# Patient Record
Sex: Female | Born: 1995 | Race: White | Hispanic: No | Marital: Married
Health system: Midwestern US, Academic
[De-identification: ages and names within clinical notes are randomized; demographics above are authoritative.]

## PROBLEM LIST (undated history)

## (undated) DIAGNOSIS — N912 Amenorrhea, unspecified: Secondary | ICD-10-CM

## (undated) LAB — HEPATIC FUNCTION PANEL
A/G Ratio: 15
ALT: 13 U/L
ALT: 13 U/L
ALT: 15 U/L
ALT: 19 U/L
ALT: 29 U/L
ALT: 29 U/L
ALT: 32 U/L
ALT: 33 U/L
ALT: 33 U/L
ALT: 33 U/L
ALT: 33 U/L
ALT: 36 U/L
ALT: 45 U/L
ALT: 45 U/L
ALT: 46 U/L
ALT: 47 U/L
ALT: 92 U/L
AST (SGOT): 21.5
AST: 109 U/L
AST: 129 U/L
AST: 21 U/L
AST: 28 U/L
AST: 29 U/L
AST: 43 U/L
AST: 45 U/L
AST: 47 U/L
AST: 48 U/L
AST: 48 U/L
AST: 48 U/L
AST: 49 U/L
AST: 50 U/L
AST: 54 U/L
AST: 55 U/L
AST: 58 U/L
Albumin: 3.6
Albumin: 3.9 g/dL (ref 3.5–5.0)
Alkaline Phosphatase: 103 U/L
Alkaline Phosphatase: 107 U/L
Alkaline Phosphatase: 108 U/L
Alkaline Phosphatase: 109 U/L
Alkaline Phosphatase: 112 U/L
Alkaline Phosphatase: 115 U/L
Alkaline Phosphatase: 125 U/L
Alkaline Phosphatase: 137 U/L
Alkaline Phosphatase: 137 U/L
Alkaline Phosphatase: 142 U/L
Alkaline Phosphatase: 201 U/L
Alkaline Phosphatase: 201 U/L
Alkaline Phosphatase: 43 U/L
Alkaline Phosphatase: 45 U/L
Alkaline Phosphatase: 71 U/L
Alkaline Phosphatase: 87 U/L
Alkaline Phosphatase: 96 U/L
Bilirubin, Direct (Micro): 0.2
Bilirubin, Direct: 0.3 mg/dL (ref 0.01–0.4)
Bilirubin, Direct: 0.34 mg/dL (ref 0.01–0.4)
Bilirubin, Direct: 0.5 mg/dL (ref 0.01–0.4)
Bilirubin, Direct: 0.5 mg/dL (ref 0.01–0.4)
Bilirubin, Direct: 0.5 mg/dL (ref 0.01–0.4)
Bilirubin, Direct: 0.5 mg/dL (ref 0.01–0.4)
Bilirubin, Direct: 0.5 mg/dL (ref 0.01–0.4)
Bilirubin, Direct: 0.57 mg/dL (ref 0.01–0.4)
Bilirubin, Direct: 0.6 mg/dL
Bilirubin, Direct: 0.6 mg/dL (ref 0.01–0.4)
Bilirubin, Direct: 0.7 mg/dL (ref 0.01–0.4)
Bilirubin, Direct: 0.7 mg/dL (ref 0.01–0.4)
Bilirubin, Direct: 0.74 mg/dL (ref 0.01–0.4)
Protein, Total: 7.3
Total Bilirubin: 1.1 mg/dL (ref 0.1–1.4)
Total Bilirubin: 1.26 mg/dL (ref 0.1–1.4)
Total Bilirubin: 1.3 mg/dL (ref 0.1–1.4)
Total Bilirubin: 1.4 mg/dL (ref 0.1–1.4)
Total Bilirubin: 1.59 mg/dL (ref 0.1–1.4)
Total Bilirubin: 1.6 mg/dL (ref <=1.4)
Total Bilirubin: 1.78 mg/dL (ref 0.1–1.4)
Total Bilirubin: 1.79 mg/dL (ref 0.1–1.4)
Total Bilirubin: 1.85 mg/dL (ref 0.1–1.4)
Total Bilirubin: 2.29 mg/dL (ref 0.1–1.4)
Total Bilirubin: 2.3 mg/dL (ref 0.1–1.4)
Total Bilirubin: 2.38 mg/dL (ref 0.1–1.4)
Total Bilirubin: 2.8 mg/dL (ref 0.1–1.4)
Total Bilirubin: 2.87 mg/dL (ref 0.1–1.4)
Total Bilirubin: 2.87 mg/dL (ref 0.1–1.4)
Total Bilirubin: 2.97 mg/dL (ref 0.1–1.4)
Total Protein: 6.4 g/dL (ref 6.4–8.2)
Total Protein: 6.4 g/dL (ref 6.4–8.2)
Total Protein: 6.5 g/dL (ref 6.4–8.2)
Total Protein: 6.5 g/dL (ref 6.4–8.2)
Total Protein: 6.5 g/dL (ref 6.4–8.2)
Total Protein: 6.6 g/dL (ref 6.4–8.2)
Total Protein: 6.8 g/dL (ref 6.4–8.2)
Total Protein: 6.9 g/dL (ref 6.4–8.2)
Total Protein: 6.9 g/dL (ref 6.4–8.2)
Total Protein: 7.3 g/dL (ref 6.4–8.2)
Total Protein: 7.3 g/dL (ref 6.4–8.2)
Total Protein: 7.5 g/dL (ref 6.4–8.2)
Total Protein: 7.7 g/dL (ref 6.4–8.2)
Total Protein: 7.7 g/dL (ref 6.4–8.2)

## (undated) LAB — RENAL FUNCTION PANEL W/O EGFR
Albumin: 2.9
Albumin: 3.1
Albumin: 3.2
Albumin: 3.2
Albumin: 3.2
Albumin: 3.2
Albumin: 3.3
Albumin: 3.3
Albumin: 3.4
Albumin: 3.4
Albumin: 3.5
Albumin: 3.7
Albumin: 3.7
Albumin: 3.7
BUN: 11 mg/dL (ref 4–21)
BUN: 12 mg/dL (ref 4–21)
BUN: 13 mg/dL (ref 4–21)
BUN: 13 mg/dL (ref 4–21)
BUN: 13 mg/dL (ref 4–21)
BUN: 13 mg/dL (ref 4–21)
BUN: 14 mg/dL (ref 4–21)
BUN: 15 mg/dL (ref 4–21)
BUN: 15 mg/dL (ref 4–21)
BUN: 15 mg/dL (ref 4–21)
BUN: 15 mg/dL (ref 4–21)
BUN: 16 mg/dL (ref 4–21)
BUN: 17 mg/dL (ref 4–21)
BUN: 18 mg/dL (ref 4–21)
BUN: 18 mg/dL (ref 4–21)
BUN: 19 mg/dL (ref 4–21)
CO2: 25 mmol/L (ref 13–22)
CO2: 25 mmol/L (ref 13–22)
CO2: 26 mmol/L (ref 13–22)
CO2: 26 mmol/L (ref 13–22)
CO2: 26 mmol/L (ref 13–22)
CO2: 28 mmol/L (ref 13–22)
CO2: 29 mmol/L (ref 13–22)
CO2: 29 mmol/L (ref 13–22)
CO2: 29 mmol/L (ref 13–22)
CO2: 29 mmol/L (ref 13–22)
CO2: 30 mmol/L (ref 13–22)
CO2: 30 mmol/L (ref 13–22)
CO2: 31 mmol/L (ref 13–22)
CO2: 31 mmol/L (ref 13–22)
Calcium: 8.6 mg/dL (ref 8.7–10.7)
Calcium: 8.6 mg/dL (ref 8.7–10.7)
Calcium: 8.7 mg/dL (ref 8.7–10.7)
Calcium: 8.8 mg/dL (ref 8.7–10.7)
Calcium: 8.8 mg/dL (ref 8.7–10.7)
Calcium: 8.9 mg/dL (ref 8.7–10.7)
Calcium: 8.9 mg/dL (ref 8.7–10.7)
Calcium: 8.9 mg/dL (ref 8.7–10.7)
Calcium: 8.9 mg/dL (ref 8.7–10.7)
Calcium: 9
Calcium: 9 mg/dL (ref 8.7–10.7)
Calcium: 9 mg/dL (ref 8.7–10.7)
Calcium: 9 mg/dL (ref 8.7–10.7)
Calcium: 9 mg/dL (ref 8.7–10.7)
Calcium: 9.1 mg/dL (ref 8.7–10.7)
Calcium: 9.1 mg/dL (ref 8.7–10.7)
Carbon Dioxide (CO2): 27
Chloride: 100 mmol/L (ref 99–108)
Chloride: 101 mmol/L (ref 99–108)
Chloride: 101 mmol/L (ref 99–108)
Chloride: 101 mmol/L (ref 99–108)
Chloride: 101 mmol/L (ref 99–108)
Chloride: 102 mmol/L (ref 99–108)
Chloride: 102 mmol/L (ref 99–108)
Chloride: 102 mmol/L (ref 99–108)
Chloride: 102 mmol/L (ref 99–108)
Chloride: 102 mmol/L (ref 99–108)
Chloride: 104 mmol/L (ref 99–108)
Chloride: 104 mmol/L (ref 99–108)
Chloride: 105 mmol/L (ref 99–108)
Chloride: 105 mmol/L (ref 99–108)
Chloride: 105 mmol/L (ref 99–108)
Chloride: 107 mmol/L (ref 99–108)
Creatinine: 0.6
Creatinine: 0.64 mg/dL (ref 0.5–1.1)
Creatinine: 0.69 mg/dL (ref 0.5–1.1)
Creatinine: 0.7
Creatinine: 0.7 mg/dL (ref 0.5–1.1)
Creatinine: 0.7 mg/dL (ref 0.5–1.1)
Creatinine: 0.7 mg/dL (ref 0.5–1.1)
Creatinine: 0.7 mg/dL (ref 0.5–1.1)
Creatinine: 0.7 mg/dL (ref 0.5–1.1)
Creatinine: 0.7 mg/dL (ref 0.5–1.1)
Creatinine: 0.74 mg/dL (ref 0.5–1.1)
Creatinine: 0.78 mg/dL (ref 0.5–1.1)
Creatinine: 0.8 mg/dL (ref 0.5–1.1)
Creatinine: 0.8 mg/dL (ref 0.5–1.1)
Creatinine: 0.8 mg/dL (ref 0.5–1.1)
Creatinine: 0.81 mg/dL (ref 0.5–1.1)
EGFR: 104.6 mg/dL
EGFR: 104.6 mg/dL
EGFR: 104.6 mg/dL
EGFR: 104.6 mg/dL
EGFR: 105 mg/dL
EGFR: 105.6 mg/dL
EGFR: 105.6 mg/dL
EGFR: 60 mg/dL
EGFR: 88 mg/dL
EGFR: 89.7 mg/dL
EGFR: 90.5 mg/dL
EGFR: 90.5 mg/dL
EGFR: 92.4 mg/dL
EGFR: 97 mg/dL
Glucose: 78 mg/dL
Glucose: 78 mg/dL
Glucose: 79
Glucose: 79 mg/dL
Glucose: 81 mg/dL
Glucose: 81 mg/dL
Glucose: 81.5 mg/dL
Glucose: 83 mg/dL
Glucose: 84 mg/dL
Glucose: 85.6 mg/dL
Glucose: 86 mg/dL
Glucose: 87.7 mg/dL
Glucose: 94 mg/dL
Glucose: 98.4 mg/dL
Phosphorus: 2.4 mg/dL (ref 2.5–4.9)
Phosphorus: 2.5 mg/dL (ref 2.5–4.9)
Phosphorus: 2.7 mg/dL (ref 2.5–4.9)
Phosphorus: 2.7 mg/dL (ref 2.5–4.9)
Phosphorus: 2.8 mg/dL (ref 2.5–4.9)
Phosphorus: 2.8 mg/dL (ref 2.5–4.9)
Phosphorus: 3 mg/dL (ref 2.5–4.9)
Phosphorus: 3 mg/dL (ref 2.5–4.9)
Phosphorus: 3.1 mg/dL (ref 2.5–4.9)
Phosphorus: 3.3 mg/dL (ref 2.5–4.9)
Phosphorus: 3.4 mg/dL (ref 2.5–4.9)
Phosphorus: 4 mg/dL (ref 2.5–4.9)
Potassium: 3.1 mmol/L (ref 3.4–5.3)
Potassium: 3.4 mmol/L (ref 3.4–5.3)
Potassium: 3.42 mmol/L (ref 3.4–5.3)
Potassium: 3.5 mmol/L (ref 3.4–5.3)
Potassium: 3.53 mmol/L (ref 3.4–5.3)
Potassium: 3.57 mmol/L (ref 3.4–5.3)
Potassium: 3.6 mmol/L (ref 3.4–5.3)
Potassium: 3.63 mmol/L (ref 3.4–5.3)
Potassium: 3.63 mmol/L (ref 3.4–5.3)
Potassium: 3.74 mmol/L (ref 3.4–5.3)
Potassium: 3.8 mmol/L (ref 3.4–5.3)
Potassium: 3.8 mmol/L (ref 3.4–5.3)
Potassium: 3.9 mmol/L (ref 3.4–5.3)
Potassium: 3.99 mmol/L (ref 3.4–5.3)
Potassium: 4.21 mmol/L (ref 3.4–5.3)
Potassium: 4.23 mmol/L (ref 3.4–5.3)
Sodium: 137 mmol/L (ref 137–147)
Sodium: 137 mmol/L (ref 137–147)
Sodium: 138 mmol/L (ref 137–147)
Sodium: 138 mmol/L (ref 137–147)
Sodium: 139 mmol/L (ref 137–147)
Sodium: 139 mmol/L (ref 137–147)
Sodium: 139 mmol/L (ref 137–147)
Sodium: 139 mmol/L (ref 137–147)
Sodium: 139 mmol/L (ref 137–147)
Sodium: 139 mmol/L (ref 137–147)
Sodium: 139 mmol/L (ref 137–147)
Sodium: 139 mmol/L (ref 137–147)
Sodium: 139 mmol/L (ref 137–147)
Sodium: 140 mmol/L (ref 137–147)
Sodium: 140 mmol/L (ref 137–147)
Sodium: 140 mmol/L (ref 137–147)

## (undated) LAB — CBC AND DIFFERENTIAL
Basophils Absolute: 0.1 /??L
Basophils Relative: 0 % (ref 0–3)
Basophils Relative: 1 % (ref 0–3)
Basophils Relative: 1 % (ref 0–3)
Eosinophils Absolute: 0.1 /??L
Eosinophils Relative: 5 % (ref 0–6)
Eosinophils Relative: 5 % (ref 0–6)
Eosinophils Relative: 5 % (ref 0–6)
Eosinophils Relative: 7 % (ref 0–6)
Eosinophils Relative: 7 % (ref 0–6)
Hematocrit: 32.3 % (ref 36–46)
Hematocrit: 32.6 % (ref 36–46)
Hematocrit: 32.7 % (ref 36–46)
Hematocrit: 33.5 % (ref 36–46)
Hematocrit: 33.5 % (ref 36–46)
Hematocrit: 33.8 % (ref 36–46)
Hematocrit: 35.8 % (ref 36–46)
Hematocrit: 37.1 % (ref 36–46)
Hematocrit: 38 % (ref 36–46)
Hematocrit: 38.1 % (ref 36–46)
Hemoglobin: 10.9 g/dL (ref 12.0–16.0)
Hemoglobin: 11 g/dL (ref 12.0–16.0)
Hemoglobin: 11.1 g/dL (ref 12.0–16.0)
Hemoglobin: 11.3 g/dL (ref 12.0–16.0)
Hemoglobin: 11.4 g/dL (ref 12.0–16.0)
Hemoglobin: 11.4 g/dL (ref 12.0–16.0)
Hemoglobin: 12.3 g/dL (ref 12.0–16.0)
Hemoglobin: 12.5 g/dL (ref 12.0–16.0)
Hemoglobin: 12.6 g/dL (ref 12.0–16.0)
Hemoglobin: 13 g/dL (ref 12.0–16.0)
Lymphocytes Absolute: 0.9 /??L
Lymphocytes Relative: 31 % (ref 18–52)
Lymphocytes Relative: 32 % (ref 18–52)
Lymphocytes Relative: 32 % (ref 18–52)
Lymphocytes Relative: 32 % (ref 18–52)
Lymphocytes Relative: 34 % (ref 18–52)
Lymphocytes Relative: 42 % (ref 18–52)
MCH: 31 pg (ref 26.0–34.0)
MCH: 31 pg (ref 26.0–34.0)
MCH: 31.9 pg (ref 26.0–34.0)
MCH: 32.1 pg (ref 26.0–34.0)
MCH: 33.9 pg (ref 26.0–34.0)
MCH: 36.4 pg (ref 26.0–34.0)
MCH: 36.6 pg (ref 26.0–34.0)
MCH: 36.8 pg (ref 26.0–34.0)
MCH: 37.7 pg (ref 26.0–34.0)
MCH: 38.7 pg (ref 26.0–34.0)
MCHC: 32.6 g/dL (ref 30–37)
MCHC: 32.7 g/dL (ref 30–37)
MCHC: 33 g/dL (ref 30–37)
MCHC: 33.7 g/dL (ref 30–37)
MCHC: 33.8 g/dL (ref 30–37)
MCHC: 34 g/dL (ref 30–37)
MCHC: 34.3 g/dL (ref 30–37)
MCHC: 34.4 g/dL (ref 30–37)
MCHC: 34.5 g/dL (ref 30–37)
MCHC: 34.9 g/dL (ref 30–37)
MCV: 106.6 fL (ref 82.0–108.0)
MCV: 108 fL (ref 82.0–108.0)
MCV: 108.1 fL (ref 82.0–108.0)
MCV: 109.2 fL (ref 82.0–108.0)
MCV: 110.8 fL (ref 82.0–108.0)
MCV: 92 fL (ref 82.0–108.0)
MCV: 94.7 fL (ref 82.0–108.0)
MCV: 95 fL (ref 82.0–108.0)
MCV: 97.9 fL (ref 82.0–108.0)
MCV: 98.4 fL (ref 82.0–108.0)
Monocytes Absolute: 0.2 /??L
Monocytes Relative: 10 % (ref 3–10)
Monocytes Relative: 10 % (ref 3–10)
Monocytes Relative: 5 % (ref 3–10)
Monocytes Relative: 6 % (ref 3–10)
Monocytes Relative: 7 % (ref 3–10)
Monocytes Relative: 8 % (ref 3–10)
Neutrophils Absolute: 1.5 /??L
Neutrophils Relative: 46 % (ref 46–78)
Neutrophils Relative: 51 % (ref 46–78)
Neutrophils Relative: 56 % (ref 46–78)
Neutrophils Relative: 56 % (ref 46–78)
Neutrophils Relative: 57 % (ref 46–78)
Platelets: 102 K/??L
Platelets: 104 K/??L
Platelets: 109 K/??L
Platelets: 112 K/??L
Platelets: 119 K/??L
Platelets: 71 K/??L
Platelets: 79 K/??L
Platelets: 84 K/??L
Platelets: 89 K/??L
Platelets: 91 K/??L
RBC: 2.99 10^6/??L (ref 4.00–5.20)
RBC: 3.02 10^6/??L (ref 4.00–5.20)
RBC: 3.03 10^6/??L (ref 4.00–5.20)
RBC: 3.1 10^6/??L (ref 4.00–5.20)
RBC: 3.35 10^6/??L (ref 4.00–5.20)
RBC: 3.42 10^6/??L (ref 4.00–5.20)
RBC: 3.56 10^6/??L (ref 4.00–5.20)
RBC: 3.63 10^6/??L (ref 4.00–5.20)
RBC: 4.03 10^6/??L (ref 4.00–5.20)
RBC: 4.12 10^6/??L (ref 4.00–5.20)
RDW: 14.4 % (ref 11.5–14.5)
RDW: 15.7 % (ref 11.5–14.5)
RDW: 16.1 % (ref 11.5–14.5)
RDW: 16.9 % (ref 11.5–14.5)
RDW: 17.3 % (ref 11.5–14.5)
RDW: 17.9 % (ref 11.5–14.5)
RDW: 18.4 % (ref 11.5–14.5)
RDW: 19.8 % (ref 11.5–14.5)
RDW: 20.6 % (ref 11.5–14.5)
RDW: 22.6 % (ref 11.5–14.5)
WBC: 1.1 10^3/mL
WBC: 1.3 10^3/mL
WBC: 1.4 10^3/mL
WBC: 1.8 10^3/mL
WBC: 1.8 10^3/mL
WBC: 1.8 10^3/mL
WBC: 1.9 10^3/mL
WBC: 2.3 10^3/mL
WBC: 2.5 10^3/mL
WBC: 2.7 10^3/mL

## (undated) LAB — PROTIME-INR
INR: 1.01 (ref 0.9–1.1)
INR: 1.17 (ref 0.9–1.1)
INR: 1.2 (ref 0.9–1.1)
INR: 1.22 (ref 0.9–1.1)
INR: 1.22 (ref 0.9–1.1)
INR: 1.24 (ref 0.9–1.1)
INR: 1.24 (ref 0.9–1.1)
INR: 1.24 (ref 0.9–1.1)
INR: 1.27 (ref 0.9–1.1)
INR: 1.28 (ref 0.9–1.1)
INR: 1.29 (ref 0.9–1.1)
INR: 1.3 (ref 0.9–1.1)
INR: 1.33 (ref 0.9–1.1)
INR: 1.33 (ref 0.9–1.1)
INR: 1.4 (ref 0.9–1.1)
Protime: 11.9 seconds (ref 10.0–13.8)
Protime: 13.3
Protime: 13.6
Protime: 13.8
Protime: 13.8
Protime: 14
Protime: 14
Protime: 14
Protime: 14.4
Protime: 14.5
Protime: 14.6
Protime: 14.7
Protime: 15
Protime: 15.1
Protime: 15.9

## (undated) LAB — LUTEINIZING HORMONE
LH: 11.1
LH: 21.9

## (undated) LAB — HEPATITIS B SURFACE ANTIGEN: Hepatitis B Surface Ag Confirm: NEGATIVE

## (undated) LAB — CBC
Hematocrit: 35.2 % (ref 36–46)
Hemoglobin: 12.2 g/dL (ref 12.0–16.0)
MCH: 36.1 pg (ref 26.0–34.0)
MCV: 103.9 fL (ref 82.0–108.0)
Platelets: 107
Red Blood Cells:: 3.39
WBC: 2.2 10^3/mL

## (undated) LAB — FOLLICLE STIMULATING HORMONE
FSH: 4.7
FSH: 6.8

## (undated) LAB — GTT 2HR (2HR)

## (undated) LAB — TESTOSTERONE, TOTAL, LC/MS
Testosterone, Reanalysis: 289
Testosterone, Reanalysis: 409

## (undated) LAB — ABO/RH: Rh Type: POSITIVE

## (undated) LAB — RPR: RPR: NEGATIVE

## (undated) LAB — ANTIBODY SCREEN: Antibody Screen: NEGATIVE

## (undated) LAB — LIPID PANEL
Cholesterol, Total: 186 mg/dL
HDL: 51
LDL Cholesterol: 124.2 mg/dL
Triglycerides: 54 mg/dL (ref 40–160)

## (undated) LAB — ANTIMULLERIAN HORMONE (AMH): Anti-Mullerian Hormone (AMH): 7.32

## (undated) LAB — COMPREHENSIVE METABOLIC PANEL: CO2: 30 mmol/L (ref 13–22)

## (undated) LAB — TUMOR MARKER PANEL: AFP-Tumor Marker: 4.2

## (undated) LAB — HEMOGLOBIN A1C: Hemoglobin A1C: 4.8 % (ref 4.0–6.0)

## (undated) LAB — GLUCOSE, RANDOM: Glucose: 87 mg/dL (ref 60–200)

## (undated) LAB — HEPATITIS C ANTIBODY: Hepatitis C Antibody: NEGATIVE

## (undated) LAB — PROLACTIN: Prolactin: 28.5

## (undated) LAB — THYROID FUNCTION CASCADE
TSH w/Reflex to FT4: 1.8
TSH w/Reflex to FT4: 4.01

## (undated) LAB — ESTRADIOL (SENSITIVE): Estradiol: 954

## (undated) LAB — 17-HYDROXYPROGESTERONE: 17-Hydroxyprogesterone: 94

## (undated) LAB — DHEA-SULFATE: DHEA Sulfate: 42.5

## (undated) LAB — RUBELLA IMMUNE STATUS: Rubella Antibodies, IgG: IMMUNE

---

## 2016-11-17 NOTE — Progress Notes (Signed)
Financially cleared

## 2016-12-06 NOTE — Unmapped (Signed)
Patient is requesting to    [   ] Speak to the Nurse :    [   ] Schedule a procedure:    [   ] Status of paper work:    [   ] Labs to be entered into [place]    [   ] Request sooner appointment with Dr.     [   ] Medication question    [   ] Patient is returning call     [ XXX  ] Inform the Provider: Pt received a call stating she needs to be seen by GI before her surgery on 4/9 and to call [phone] to resched. I don't have anything avail before 4/24. Please follow up with Joni Reining to sched [phone]    [   ] Schedule a New Patient Appointment with the following Provider:     [   ] Other:

## 2016-12-07 NOTE — Unmapped (Signed)
Left message: Tracy Watkins has an appointment in the Liver clinic 12/14/16 @ 3:20 PM.  Will send appointment reminder to address on file.

## 2016-12-14 ENCOUNTER — Ambulatory Visit: Admit: 2016-12-14 | Payer: TRICARE (CHAMPUS) | Attending: Gastroenterology

## 2016-12-14 ENCOUNTER — Other Ambulatory Visit: Admit: 2016-12-14 | Payer: TRICARE (CHAMPUS)

## 2016-12-14 DIAGNOSIS — K754 Autoimmune hepatitis: Secondary | ICD-10-CM

## 2016-12-14 LAB — HEPATIC FUNCTION PANEL
ALT: 60 U/L — ABNORMAL HIGH (ref 7–52)
AST: 80 U/L — ABNORMAL HIGH (ref 13–39)
Albumin: 3.3 g/dL — ABNORMAL LOW (ref 3.5–5.7)
Alkaline Phosphatase: 145 U/L — ABNORMAL HIGH (ref 36–125)
Bilirubin, Direct: 0.52 mg/dL — ABNORMAL HIGH (ref 0.00–0.40)
Bilirubin, Indirect: 1.38 mg/dL — ABNORMAL HIGH (ref 0.00–1.10)
Total Bilirubin: 1.9 mg/dL — ABNORMAL HIGH (ref 0.0–1.5)
Total Protein: 7.2 g/dL (ref 6.4–8.9)

## 2016-12-14 LAB — CBC
Hematocrit: 38.7 % (ref 35.0–45.0)
Hemoglobin: 12.7 g/dL (ref 11.7–15.5)
MCH: 29.8 pg (ref 27.0–33.0)
MCHC: 32.9 g/dL (ref 32.0–36.0)
MCV: 90.7 fL (ref 80.0–100.0)
MPV: 7.8 fL (ref 7.5–11.5)
Platelets: 98 10*3/uL (ref 140–400)
RBC: 4.27 10*6/uL (ref 3.80–5.10)
RDW: 16.2 % (ref 11.0–15.0)
WBC: 3 10*3/uL (ref 3.8–10.8)

## 2016-12-14 LAB — PROTIME-INR
INR: 1.5 — ABNORMAL HIGH (ref 0.9–1.1)
Protime: 18.3 s — ABNORMAL HIGH (ref 11.8–14.8)

## 2016-12-14 LAB — BASIC METABOLIC PANEL
Anion Gap: 4 mmol/L (ref 3–16)
BUN: 17 mg/dL (ref 7–25)
CO2: 28 mmol/L (ref 21–33)
Calcium: 9.6 mg/dL (ref 8.6–10.3)
Chloride: 103 mmol/L (ref 98–110)
Creatinine: 0.73 mg/dL (ref 0.60–1.30)
Glucose: 82 mg/dL (ref 70–100)
Osmolality, Calculated: 281 mOsm/kg (ref 278–305)
Potassium: 3.7 mmol/L (ref 3.5–5.3)
Sodium: 135 mmol/L (ref 133–146)
eGFR AA CKD-EPI: >90 See note.
eGFR NONAA CKD-EPI: >90 See note.

## 2016-12-14 NOTE — Unmapped (Signed)
Get labs drawn today.  You will be seen in Transplant Clinic next week as previously scheduled.  You may need to be switched back to Azathioprine based on how the labs look.

## 2016-12-14 NOTE — Unmapped (Signed)
Digestive Diseases Fellow Consultation Note    History: Reliable historian    ID: Patient is a 21 y.o. 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 She recently moved to [place] as her husband was transferred to [facility] and presents to establish care.  She was diagnosed in 03/2016 when a dermatologist noted elevated liver enzymes, which eventually led to diagnosis of cirrhosis.  She has been managed on CellCept 1g BID since 05/2016 and has been tapered off prednisone.  She had previously been on azathioprine 100mg  daily but this was switched due to lack of improvement of liver enzymes.  At that time, she weighed more so it was approximately 1mg /kg dosing.  Her ALT has never normalized since diagnosis.  She had previously been on infectious prophylaxis with Bactrim and Valcyte d/t high dose immunosuppression.  She has previously also been on Accutane d/t acne but is off that now.      Per last hepatology note, the CellCept dose does not seem to be changing her aminotransferases significantly.  Her most recent MELD-Na score was 15.  She last had a RUQ [place] for [facility] screening 06/10/2016 which showed no evidence of malignancy, AFP was 5 at that time.  She last had an EGD 05/20/16 which showed 3 columns of GI nonbleeding varices and mild portal hypertensive gastropathy.      She was worked up and listed for transplant in [place], and had an echo done with echo 60%, no ASD, PFO, or shunting, RVSP 21.    She is seeing a Dr. Junius Roads at the [facility] in [place], he is a GI doctor.  She got a RUQ [place] last month, doesn't know the results.  She has had labs done, most recently had an ALT of 92 on 10/13/2016.    History:  Past Medical History:   Diagnosis Date   ??? Cirrhosis (CMS Dx)      History reviewed. No pertinent surgical history.  History reviewed. No  pertinent family history.  Social History     Social History   ??? Marital status: Married     Spouse name: N/A   ??? Number of children: N/A   ??? Years of education: N/A     Social History Main Topics   ??? Smoking status: Never Smoker   ??? Smokeless tobacco: Never Used   ??? Alcohol use No   ??? Drug use: No   ??? Sexual activity: Not Asked     Other Topics Concern   ??? Caffeine Use Yes   ??? Exercise Yes   ??? Seat Belt Yes     Social History Narrative   ??? None     Allergies   Allergen Reactions   ??? Penicillins    ??? Latex Rash       Home Medications:   Current Outpatient Prescriptions   Medication Sig   ??? doxycycline Take 100 mg by mouth 2 times a day.   ??? hydroCHLOROthiazide Take 25 mg by mouth daily.   ??? mycophenolate Take by mouth 2 times a day.   ??? potassium chloride Take 20 mEq by mouth 3 times a day.   ??? spironolactone Take 50 mg by mouth daily.   ??? ISOtretinoin Take 20 mg by mouth 2 times a day with meals.   ??? predniSONE Take 5 mg  by mouth daily.   ??? valGANciclovir Take 900 mg by mouth daily.     No current facility-administered medications for this visit.        ROS:   Please refer to scanned document.     Physical Exam:    Vitals:    12/14/16 1519   BP: 125/76   Pulse: 89   Resp: 18   Temp: 98.1 ??F (36.7 ??C)   TempSrc: Oral   Weight: 197 lb 9.6 oz (89.6 kg)   Height: 5' 7 (1.702 m)       Physical Exam   Constitutional: She is oriented to person, place, and time. She appears well-developed and well-nourished. No distress.   HENT:   Head: Normocephalic and atraumatic.   Mouth/Throat: Oropharynx is clear and moist. No oropharyngeal exudate.   Eyes: EOM are normal. Pupils are equal, round, and reactive to light. No scleral icterus.   Neck: Normal range of motion. Neck supple.   Cardiovascular: Normal rate, regular rhythm and normal heart sounds.    No murmur heard.  Pulmonary/Chest: Effort normal and breath sounds normal. No respiratory distress. She has no wheezes.   Abdominal: Soft. Bowel sounds are normal. She exhibits no  distension. There is no tenderness.   Musculoskeletal: Normal range of motion. She exhibits no edema or tenderness.   Neurological: She is alert and oriented to person, place, and time. No cranial nerve deficit.   No asterixis   Skin: Skin is warm and dry. No rash noted. No erythema.   Slight jaundice, significant acne scarring on face, no spider angiomata, or palmar erythema   Psychiatric: She has a normal mood and affect. Her behavior is normal. Judgment and thought content normal.       Labs:   Lab Results   Component Value Date    GLUCOSE 86 10/05/2016    BUN 12 10/05/2016    CO2 25 (A) 10/05/2016    CREATININE 0.7 10/05/2016    K 4.21 10/05/2016    NA 139 10/05/2016    CL 105 10/05/2016    CALCIUM 8.9 10/05/2016    PROT 6.6 10/05/2016    ALBUMIN 2.90 10/05/2016    BILITOT 1.78 (A) 10/05/2016    ALKPHOS 201 10/05/2016    ALKPHOS 201 10/05/2016    AST 129 10/05/2016    ALT 92 10/05/2016     Lab Results   Component Value Date    WBC 2.5 10/05/2016    HGB 12.5 10/05/2016    HCT 38.1 10/05/2016    MCV 94.7 10/05/2016    PLT 109 10/05/2016     Lab Results   Component Value Date    INR 1.4 (A) 10/05/2016     No results found for: TSH    LFT Latest Ref Rng & Units 10/05/2016 10/05/2016   BILI 0.1 - 1.4 mg/dL - 1.61(W)   BILI DIR 9.60 - 0.4 mg/dL 4.5(W) -   ALK PHOS U/L 201 201   AST U/L - 129   ALT U/L - 92   PROTEIN 6.4 - 8.2 g/dL - 6.6       Assessment/Plan:  Problem List Items Addressed This Visit        Digestive    Autoimmune hepatitis (CMS Dx) - Primary    Relevant Orders    Basic metabolic panel    CBC    Protime-INR    Hepatic Function Panel        20yo F w/PMH severe acne, PCOS, and cirrhosis  2/2 AIH who is listed for transplant at [facility].  She is currently managed on Cellcept 1gBID and does not seem to have ever been fully controlled.  She is expected to see transplant clinic next week, who will take over the predominant management of her care.  However, in preparation for that visit  we will have all her records scanned in and check MELD labs today.  She has a blood type A.  She may need to be switched back to azathioprine at a more appropriate weight-based dosing to get her AIH under better control.  She appears to be UTD on [facility] screening [place] and variceal screening was performed in 05/2016 with G1/small nonbleeeding varices.  No encephalopathy or ascites.      Return if symptoms worsen or fail to improve.    Mare Ferrari  12/14/2016

## 2016-12-14 NOTE — Unmapped (Signed)
Attending Physician's Note:  I have personally seen and examined the patient, have discussed the case with Dr. Kristopher Oppenheim, agree with fellow note and confirm it.  Please see Dr. Desma Mcgregor note for more details    A 21 y.o. 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 She is well-compensated. Pt was inappropriately scheduled for appt here in mistake. Being seen by OLT clinic next week. No acute visit needed. She is listed for OLT at [facility] in [place].     She was diagnosed in July 2017 with AIH with cirrhosis based on biopsy and labs. She had elevated ALT -200 with strongly positive autoimmune markers - IgG-3200, f-actin-71, +ANA. Her biopsy was consistent with cirrhosis with lymphoplasmacytic infiltrate. She was initiated on Pred 60 and Imuran 100 mg with improvement but not normalization of ALT. Improved to ALT-66 in Sept 2017. Therefore, she was switched to Cellcept 1 gram BID and tapered off of Prednisone. Her last ALT in Jan 2018 was 92.     PE  In no apparent cardiopulmonary distress  Appears icteric  Abdomen is soft, non-tender  Alert and oriented times three    A&P  Cirrhosis due to AIH: The patient is listed for OLT in [place] but coming here to move to [place], [place]. She is compensated with elevated MELD of 15. Suspect her disease is under treated and that is reason for elevated MELD. I feel that she was underdosed with Imuran (only received 1 mg/kg) prior to transition to Cellcept. Will repeat labs today to determine response and would consider switching back to higher dose Imuran (2-2.5 mg/kg) for control. Will be seen in transplant clinic next week.     Will not charge for this visit due to mistake in scheduling.      Willeen Niece, MD  Transplant Hepatologist

## 2016-12-20 ENCOUNTER — Ambulatory Visit: Admit: 2016-12-20 | Payer: TRICARE (CHAMPUS)

## 2016-12-20 ENCOUNTER — Other Ambulatory Visit: Admit: 2016-12-20 | Payer: TRICARE (CHAMPUS)

## 2016-12-20 ENCOUNTER — Other Ambulatory Visit

## 2016-12-20 ENCOUNTER — Encounter

## 2016-12-20 DIAGNOSIS — K754 Autoimmune hepatitis: Secondary | ICD-10-CM

## 2016-12-20 DIAGNOSIS — K7460 Unspecified cirrhosis of liver: Secondary | ICD-10-CM

## 2016-12-20 LAB — RENAL FUNCTION PANEL W/EGFR
Albumin: 3.3 g/dL (ref 3.5–5.7)
Anion Gap: 5 mmol/L (ref 3–16)
BUN: 17 mg/dL (ref 7–25)
CO2: 25 mmol/L (ref 21–33)
Calcium: 9.3 mg/dL (ref 8.6–10.3)
Chloride: 105 mmol/L (ref 98–110)
Creatinine: 0.7 mg/dL (ref 0.60–1.30)
Glucose: 78 mg/dL (ref 70–100)
Osmolality, Calculated: 280 mOsm/kg (ref 278–305)
Phosphorus: 2.9 mg/dL (ref 2.1–4.7)
Potassium: 4 mmol/L (ref 3.5–5.3)
Sodium: 135 mmol/L (ref 133–146)
eGFR AA CKD-EPI: >90 See note.
eGFR NONAA CKD-EPI: >90 See note.

## 2016-12-20 LAB — DIFFERENTIAL
Basophils Absolute: 26 /uL (ref 0–200)
Basophils Relative: 1.1 % (ref 0.0–1.0)
Eosinophils Absolute: 72 /uL (ref 15–500)
Eosinophils Relative: 3 % (ref 0.0–8.0)
Lymphocytes Absolute: 902 /uL (ref 850–3900)
Lymphocytes Relative: 37.6 % (ref 15.0–45.0)
Monocytes Absolute: 214 /uL (ref 200–950)
Monocytes Relative: 8.9 % (ref 0.0–12.0)
Neutrophils Absolute: 1186 /uL (ref 1500–7800)
Neutrophils Relative: 49.4 % (ref 40.0–80.0)
nRBC: 1 /100 WBC (ref 0–0)

## 2016-12-20 LAB — MMR(IGG) PANEL (MEASLES, MUMPS, RUBELLA)
MUMPS IGG NUM: 46.8 U/mL (ref 0.0–8.9)
Mumps IgG: POSITIVE
RUB IGG NUM: 300 U/mL (ref 0.0–24.9)
RUB NUM: 7 INDEX (ref 0.0–0.8)
Rubella IgG Scr: POSITIVE
Rubeola Ab, IgG: POSITIVE

## 2016-12-20 LAB — CBC
Hematocrit: 38.4 % (ref 35.0–45.0)
Hemoglobin: 12.6 g/dL (ref 11.7–15.5)
MCH: 30 pg (ref 27.0–33.0)
MCHC: 32.9 g/dL (ref 32.0–36.0)
MCV: 91.1 fL (ref 80.0–100.0)
MPV: 8.3 fL (ref 7.5–11.5)
Platelets: 96 10*3/uL (ref 140–400)
RBC: 4.21 10*6/uL (ref 3.80–5.10)
RDW: 16.3 % (ref 11.0–15.0)
WBC: 2.4 10*3/uL (ref 3.8–10.8)

## 2016-12-20 LAB — HEPATIC FUNCTION PANEL
ALT: 52 U/L (ref 7–52)
AST: 65 U/L — ABNORMAL HIGH (ref 13–39)
Albumin: 3.3 g/dL — ABNORMAL LOW (ref 3.5–5.7)
Alkaline Phosphatase: 147 U/L — ABNORMAL HIGH (ref 36–125)
Bilirubin, Direct: 0.5 mg/dL — ABNORMAL HIGH (ref 0.00–0.40)
Bilirubin, Indirect: 1.6 mg/dL — ABNORMAL HIGH (ref 0.00–1.10)
Total Bilirubin: 2.1 mg/dL — ABNORMAL HIGH (ref 0.0–1.5)
Total Protein: 7.2 g/dL (ref 6.4–8.9)

## 2016-12-20 LAB — EPSTEIN-BARR VIRUS VCA IGG AB
EBV IGG NUM: 750 U/mL (ref 0.00–17.99)
EBV VCA IgG: POSITIVE

## 2016-12-20 LAB — ALPHA-1-ANTITRYPSIN (AAT) QUANTITATION & MUTATION: A-1 Antitrypsin: 103 mg/dL (ref 90–200)

## 2016-12-20 LAB — LIPID PANEL
Cholesterol, Total: 171 mg/dL (ref 0–200)
HDL: 62 mg/dL (ref 60–92)
LDL Cholesterol: 97 mg/dL
Triglycerides: 61 mg/dL (ref 10–149)

## 2016-12-20 LAB — CMV IGG ANTIBODY
CMV IGG NUM: 7.5 U/mL (ref 0.00–0.59)
CMV IgG: POSITIVE

## 2016-12-20 LAB — ANA COMPREHENSIVE PANEL
Anti JO-1: NEGATIVE
JO1 Ratio: 0.21 ratio (ref 0.00–0.90)
SCL70 Ratio: 0.48 ratio (ref 0.00–0.90)
SM Antibody: NEGATIVE
SM Ratio: 0.42 ratio (ref 0.00–0.90)
SM/ RNP AB: NEGATIVE
SMRNP Ratio: 0.39 ratio (ref 0.00–0.90)
SSA (RO) Ab: NEGATIVE
SSA/RO Ratio: 0.18 ratio (ref 0.00–0.90)
SSB (LA) Ab: NEGATIVE
SSB/LA Ratio: 0.21 ratio (ref 0.00–0.90)
Scleroderma SCL-70: NEGATIVE
ds DNA Ab: NEGATIVE
dsDNA NUM: 179 IU/mL (ref 0–200)

## 2016-12-20 LAB — DRUG ABUSE PANEL, SERUM
Amphetamines, Serum: NEGATIVE ng/mL
Barbiturates, Serum: NEGATIVE ug/mL
Benzodiazepine, Serum: NEGATIVE ng/mL
Cocaine and Metabolites, Serum: NEGATIVE ng/mL
Marijuana Metabolite, Serum: NEGATIVE ng/mL
Methadone, Serum: NEGATIVE ng/mL
Opiates, Serum: NEGATIVE ng/mL
Oxycodone: NEGATIVE ng/mL
Phencyclidine, Serum: NEGATIVE ng/mL
Propoxyphene Screen, Serum: NEGATIVE ng/mL

## 2016-12-20 LAB — PROTIME-INR
INR: 1.5 (ref 0.9–1.1)
Protime: 17.9 seconds (ref 11.8–14.8)

## 2016-12-20 LAB — QUANTIFERON TB GOLD
QFT TB Ag minus Nil: 0.015 IU/mL
QuantiFERON Nil: 0.032 IU/mL
QuantiFERON TB Gold: NEGATIVE

## 2016-12-20 LAB — HEPATITIS C ANTIBODY
HCV Ab: NONREACTIVE
HCVAB Number: 0.29 S/CO (ref 0.00–0.79)

## 2016-12-20 LAB — ANA (IFA) WITH TITER
ANA Titer by IFA: POSITIVE
Homogeneous Pattern: 1:640 {titer}
Speckled Pattern: 1:640 {titer}

## 2016-12-20 LAB — IRON STUDIES
% Iron Saturation: 36.7 % (ref 15.0–55.0)
Iron: 98 ug/dL (ref 50–212)
TIBC: 267 ug/dL (ref 265–497)

## 2016-12-20 LAB — HEMOGLOBIN A1C: Hemoglobin A1C: 4.5 % (ref 4.8–6.4)

## 2016-12-20 LAB — TOXOPLASMA GONDII ANTIBODY, IGG: Toxoplasma Gondii IgG: <3 IU/mL (ref 0.0–7.1)

## 2016-12-20 LAB — HIV 1+2 ANTIBODY/ANTIGEN WITH REFLEX: HIV 1+2 AB/AGN: NONREACTIVE

## 2016-12-20 LAB — CMV IGM ANTIBODY
CMV IGM NUM: 19.1 AU/mL (ref 0.00–29.99)
CMV IgM: NEGATIVE

## 2016-12-20 LAB — VARICELLA ZOSTER ANTIBODY, IGG
VZV NUM: 215.8 INDEX (ref 0.00–134.99)
Varicella IgG: POSITIVE

## 2016-12-20 LAB — ABO/RH: Rh Type: POSITIVE

## 2016-12-20 LAB — TSH: TSH: 2.27 u[IU]/mL (ref 0.45–4.12)

## 2016-12-20 LAB — TREPONEMA PALLIDUM AB WITH REFLEX: Treponema Pallidum: NEGATIVE

## 2016-12-20 LAB — HEPATITIS B CORE ANTIBODY: Hep B Core Total Ab: NONREACTIVE

## 2016-12-20 LAB — HEPATITIS B SURFACE ANTIBODY, QUANTITATIVE
HBSAB NUMBER: 12.07 m[IU]/mL (ref 0.00–7.99)
Hep B S Ab: REACTIVE

## 2016-12-20 LAB — F-ACTIN SMOOTH MUSCLE AB: F-Actin Smooth Muscle IgG: 49 Units (ref 0–19)

## 2016-12-20 LAB — IGA: IgA: 399 mg/dL (ref 82.0–453.0)

## 2016-12-20 LAB — ANA W/REFLEX TO IFA/COMP PANEL
ANA Ratio: 6.97 ratio (ref 0.00–0.99)
ANA Screen: POSITIVE

## 2016-12-20 LAB — ANTIBODY SCREEN: Antibody Screen: NEGATIVE

## 2016-12-20 LAB — IGG: IgG: 2450 mg/dL (ref 751.0–1560.0)

## 2016-12-20 LAB — VITAMIN D 25 HYDROXY: Vit D, 25-Hydroxy: 16.5 ng/mL (ref 30.0–100)

## 2016-12-20 LAB — CERULOPLASMIN: Ceruloplasmin: 19 mg/dL (ref 18.0–58.0)

## 2016-12-20 LAB — HEPATITIS B SURFACE ANTIGEN: Hep B Surface Ag: NONREACTIVE

## 2016-12-20 LAB — FERRITIN: Ferritin: 28.6 ng/mL (ref 11.0–306.8)

## 2016-12-20 LAB — HEPATITIS A IGM: Hep A IgM: NONREACTIVE

## 2016-12-20 LAB — HEPATITIS A ANTIBODY TOTAL: Hep A IgG: REACTIVE

## 2016-12-20 LAB — ANTIMITOCHONDRIAL ANTIBODY: Anti-Mitochon Ab IFA: <20 Units (ref 0.0–20.0)

## 2016-12-20 LAB — ETHANOL, SERUM: Ethanol: <10 mg/dL (ref 0–10)

## 2016-12-20 LAB — AFP TUMOR MARKER: AFP-Tumor Marker: 3.1 ng/mL (ref 0.0–9.0)

## 2016-12-20 NOTE — Progress Notes (Signed)
Subjective:       Tracy Watkins is a 21 y.o. 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 She is not clinically decompensated. She is referred by Dr. Junius Roads.    The patient was diagnosed with AIH in June 2017 and was listed for liver transplant at [facility] in [place]. 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 Her liver tests were elevated - AST-266, ALT-210, ALP-255, TB-2.8 with imaging suggestive of cirrhosis. Her serological evaluation was strongly suggestive of AIH - + ANA, ASMA-74, IgG-3200, AMA-negative. She underwent liver biopsy in June 2017 which revealed underlying cirrhosis with severe hepatitis - predominantly lymphoplasmacytic infiltrate with interface activity, hepatocyte dropout, incomplete granulmomas, and bile ductular proliferation. As a result, she was initiated on high dose prednisone 60 mg and eventually azathioprine 100 mg was added in late July 2017. Due to plateaued liver tests, she was transitioned to Cellcept 1 gram BID in August / Sept 2017 and tapered rapidly off of prednisone. Other than mild jaundice, she is not clinically decompensated without any GI bleeding, ascites or confusion. Her EGD in Sept 2017 had evidence of small varices. Her evaluation for transplant was unremarkable in [place]. She moved to [place], [place] in Jan 2018 due to husband's job in [employer].    In the internim, repeat liver tests last week were - AST-80, ALT-60, ALP-145, TB-1.9, WBC-3.     The following portions of the patient's history were reviewed and updated as appropriate: allergies, current medications, past family history, past medical history, past social history, past surgical history and problem list.    Comprehensive review of systems performed.  See scanned review of  systems form for details. An extensive review of past records including prior office notes, labs, imaging, and pathology reports was performed from the Media tab, Care Everywhere and paper documents.      Objective:    Physical Exam   Nursing note and vitals reviewed.  Constitutional: She is oriented to person, place, and time. She appears well-developed and well-nourished. No distress.   HENT:   Head: Normocephalic and atraumatic.   Mouth/Throat: Oropharynx is clear and moist. No oropharyngeal exudate.   Eyes: Conjunctivae are normal. Pupils are equal, round, and reactive to light. No scleral icterus.   Neck: Normal range of motion. Neck supple. No tracheal deviation present. No thyromegaly present.   Cardiovascular: Normal rate, regular rhythm and normal heart sounds.    Pulmonary/Chest: Effort normal and breath sounds normal. No respiratory distress. She has no wheezes.   Abdominal: Soft. Bowel sounds are normal. She exhibits no distension and no mass. There is no tenderness.   Musculoskeletal: Normal range of motion. She exhibits no edema.   Lymphadenopathy:     She has no cervical adenopathy.   Neurological: She is alert and oriented to person, place, and time. No cranial nerve deficit.   No asterixis   Skin: Skin is warm and dry. No erythema.   + spider angiomata   Psychiatric: She has a normal mood and affect. Her behavior is normal. Thought content normal.     Labs: The patient's recent laboratory results were reviewed.      Assessment & Plan:       21 year old Caucasian female with cirrhosis due to AIH here for transplant evaluation.  1) Cirrhosis due to AIH: The patient is clearly cirrhotic with elevated MELD around 15 who is listed at [facility] in [facility]. Other than mild jaundice, she is not clinically decompensated. Based on review of her biopsy report and labs, it appears she likely has autoimmune hepatitis. Unfortunately, her liver tests improved, but did not normalize on high dose prednisone and  azathioprine 100 mg (1mg /kg) and Cellcept 1 gram BID. Suspect she was refractory or under-dosed with azathioprine. Will consider a return to azathioprine 150 mg daily and attempt titration. Will need to utilize birth control while on Cellcept. Will complete a full serological evaluation of liver disease.  Will get records from [facility] and plan to list in near future.   Will request prior liver biopsy slides to review with our pathologist. Will check immunity to Hepatitis A and B.   Would continue surveillance for HCC with AFP and ultrasound every 6 months  EGD at [facility] on annual basis.   Continue current dose of Cellcept 1000 mg twice daily. Will likely switch you in near future.  Return in 2-3 months.    Plan:  1.  Will complete a full serological evaluation of liver disease.  2.  Will get records from [facility] [place] [facility] and plan to list in near future.   3.  Will request prior liver biopsy slides to review with our pathologist.  4.  Will check immunity to Hepatitis A and B.   5.  Would continue surveillance for HCC with AFP and ultrasound every 6 months  6.  EGD at [facility] on annual basis.   7.  Continue current dose of Cellcept 1000 mg twice daily. Will likely switch you in near future.   8.  Return in 2-3 months.

## 2016-12-20 NOTE — Unmapped (Signed)
The patient and support persons participated in multi-disciplinary transplant education.  During this visit they met with various members of the of the liver transplant team who addressed their questions and concerns.  We discussed the evaluation process, the selection criteria for liver transplant wait-listing, and the risks and benefits of liver transplant.

## 2016-12-20 NOTE — Unmapped (Signed)
Initial Liver Transplant Nutrition Assessment    Met with Cordelia Pen and significant other as part of the evaluation process for liver transplant.    HPI:  Per Dr. Landis Martins.    Patient reports a good appetite; states she typically eats 2-3 meals + snacks daily. Patient also utilizes a supplemental protein powder mixed with water, 3-4x/week.  24 hour diet recall reveals that Patient typically eats a lighter lunch of cheese + crackers + fruit; suggested that Patient utilize protein shake daily with lunch to provide more complete nutrition.  Reviewed high protein, low sodium diet and stressed the importance of maintaining adequate calorie and protein intake at this time. Recommended 3 meals or 4-5 small meals throughout the day + supplemental protein powder and provided tips and suggestions to improve current eating habits/patterns. Patient has received written education materials r/t low sodium/high protein diet in past; asked questions during visit and appeared motivated to make positive changes to diet. Encouraged increasing physical activity as tolerated.     Past Medical History:   Diagnosis Date   ??? Acne    ??? 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 Cirrhosis (CMS Dx)    ??? Esophageal varices (CMS Dx) 05/2016    Grade 1/small, no history of bleeding   ??? PCOS (polycystic ovarian syndrome)        Current Outpatient Prescriptions on File Prior to Visit   Medication Sig Dispense Refill   ??? doxycycline (DORYX) 100 MG EC tablet Take 100 mg by mouth 2 times a day.     ??? hydroCHLOROthiazide (HYDRODIURIL) 25 MG tablet Take 25 mg by mouth daily.     ??? mycophenolate (CELLCEPT) 500 mg tablet Take 1,000 mg by mouth 2 times a day.     ??? potassium chloride (KCL) 20 mEq/15 mL solution Take 20 mEq by mouth 3 times a day.     ??? spironolactone (ALDACTONE) 50 MG tablet Take 50 mg by mouth daily.       No current facility-administered medications on file prior to visit.        Allergies   Allergen Reactions   ??? Penicillins    ???  Latex Rash       Labs:  Lab Results   Component Value Date    HGBA1C 4.5 (L) 12/20/2016     Lab Results   Component Value Date    ALT 52 12/20/2016    AST 65 (H) 12/20/2016    ALKPHOS 147 (H) 12/20/2016    BILITOT 2.1 (H) 12/20/2016     Lab Results   Component Value Date    ALBUMIN 3.3 (L) 12/20/2016    ALBUMIN 3.3 (L) 12/20/2016     Lab Results   Component Value Date    CREATININE 0.70 12/20/2016    BUN 17 12/20/2016    NA 135 12/20/2016    K 4.0 12/20/2016     Lab Results   Component Value Date    PHOS 2.9 12/20/2016     Lab Results   Component Value Date    VITD25H 16.5 (L) 12/20/2016     Anthropometrics:  ANTHROPOMETRICS 12/14/2016 12/20/2016 12/20/2016   HEIGHT 5' 7 - -   WEIGHT 197 lbs 10 oz 198 lbs 198 lbs   BODY MASS INDEX 30.95 31.01 31.01     UBW: 175 lbs - Patient states she used to be at this weight for several years, then gained weight d/t starting birth control.  Pt with goal of returning to 175  lbs.     Reported ascites / edema / muscle loss    Physician recommended diet: Low Sodium and Protein    Past nutrition education: Yes    Meal Replacement Products (liquids, bars, etc):  Protein powder + water, 3-4x/week     Dietary Supplements (Fiber tablets or powder, garlic pills, herbs, DHEA, etc): none    Vitamins or Minerals: Calcium     Appetite: Good    Current Eating Pattern (typical foods eaten)    Breakfast: egg + cheese + [place] bacon + english muffin    Lunch: cheese + crackers + fruit    Dinner: protein + starch + vegetable     Snacks: cheese + crackers, fruit     Beverages: water and sports drinks    Physical Activity:    Activities of daily living: Able to complete  Current structured exercises:  Walking     Nutrition Diagnosis    Food & Nutrition Related Knowledge Deficit  Related to no prior/limited education regarding diet for low sodium/high protein  As evidenced by patient with many questions about diet     Nutritional Intervention: Supplemental protein powder, Regular schedule of high protein  meals/snacks, General/healthful diet and Nutrition education    Materials Provided:  Contact information given at class      Learner Readiness Method Response   Patient  [x]  Eager   [x]  Acceptance   []  Non-Acceptance   []  Refuses  [x]  Explanation   []  Demonstration   []  Handout   []  Interpreter  [x]  Verbalized Understanding   []  Demonstrated Understanding   []  Needs Reinforcement   []  No Evidence of Learning   []  Refused Teaching   []  Teach Back       Goals:  1. Keep active by walking as tolerated.  2. Eat 3 or 4 to 5 small meals a day. Do not skip meals.  3. Eat high-protein foods. (milk, eggs, cheese, meats, beans, nuts, peanut butter)  4. Use supplemental protein powder once daily. Include nutrient dense foods as discussed.  5. Choose foods consistent with low sodium/high protein diet.       Elveria Rising MS, RD, LD  Clinical Dietitian   Pager [phone]

## 2016-12-20 NOTE — Unmapped (Signed)
Transplant Surgery Evaluation and Note    Patient: Tracy Watkins Age: 21 y.o.  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  No previous surgery.  She just moved here from [place].  Listed in [facility] at [facility].  She was accompanied by her husband.    A 21 y.o. female who presents with     MELD-Na score: 15 at 12/14/2016  4:13 PM  Calculated from:  Serum Creatinine: 0.73 mg/dL (Rounded to 1) at 01/15/980  4:13 PM  Serum Sodium: 135 mmol/L at 12/14/2016  4:13 PM  Total Bilirubin: 1.9 mg/dL at 09/21/1476  2:95 PM  INR(ratio): 1.5 at 12/14/2016  4:13 PM  Age: 21 years          Review of Systems     Constitutional: Negative for activity change, chills, diaphoresis, fatigue and fever.   HENT: Negative  Eyes: Negative.    Respiratory: Negative for cough, chest tightness, shortness of breath and wheezing.    Cardiovascular: Negative for chest pain.   Gastrointestinal: Negative for abdominal distention, abdominal pain, blood in stool, nausea and vomiting  Endocrine: Negative.    Genitourinary: Negative.    Musculoskeletal: Negative.    Skin: Negative.    Allergic/Immunologic: Negative.    Neurological: Negative for light-headedness and headaches.   Hematological: Negative.    Psychiatric/Behavioral: Negative.          Vital Signs:  BP Readings from Last 3 Encounters:   12/14/16 125/76    Wt Readings from Last 3 Encounters:   12/14/16 197 lb 9.6 oz (89.6 kg)      BMI: Estimated body mass index is 30.95 kg/m?? as calculated from the following:    Height as of 12/14/16: 5' 7 (1.702 m).    Weight as of 12/14/16: 197 lb 9.6 oz (89.6 kg).    BSA: Estimated body surface area is 2.06 meters squared as calculated from the following:    Height as of 12/14/16: 5' 7 (1.702 m).    Weight as of 12/14/16: 197 lb 9.6 oz (89.6 kg).      Past Medical History:   Diagnosis Date   ??? Acne    ??? 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 Cirrhosis (CMS Dx)    ??? Esophageal varices (CMS Dx) 05/2016    Grade  1/small, no history of bleeding   ??? PCOS (polycystic ovarian syndrome)        No past surgical history on file.    No family history on file.    Social History     Social History   ??? Marital status: Married     Spouse name: N/A   ??? Number of children: N/A   ??? Years of education: N/A     Occupational History   ??? Not on file.     Social History Main Topics   ??? Smoking status: Never Smoker   ??? Smokeless tobacco: Never Used   ??? Alcohol use No   ??? Drug use: No   ??? Sexual activity: Not on file     Other Topics Concern   ??? Caffeine Use Yes   ??? Exercise Yes   ??? Seat Belt Yes     Social History Narrative   ??? No narrative on file         Physical Exam:   General: no apparent distress, conversant  Eyes: anicteric sclera, moist conjunctiva, pupils equal and round reactive  to light  HENT: atraumatic mucous membranes moist  Neck: trachea midline, full range of motion, no thyromegaly or adenopathy  Cardiac: regular rate and rhythm, no murmurs, rubs, and gallops  Respiratory: clear to auscultation bilaterally, normal respiratory effort  Gastrointestinal: abdomen is soft nontender, nondistended, no palpable masses  Extremity: warm, no clubbing, cyanosis, or edema  Lymph: no palpable lymphadenopathy  Skin: no rashes or ulcers, normal temperature and turgor  Psych: appropriate affect, alert and oriented to person, place, and time      Labs:      Lab name 12/14/16  1613   HEMOGLOBIN 12.7   HEMATOCRIT 38.7   MEAN CORPUSCULAR VOLUME 90.7   PLATELETS 98*   SODIUM 135   POTASSIUM 3.7   CHLORIDE 103   CO2 28   BUN 17   CREATININE 0.73   GLUCOSE 82   ALBUMIN 3.3*   CALCIUM 9.6   AST 80*   ALT 60*   BILIRUBIN TOTAL 1.9*   ALK PHOS 145*   INR 1.5*               Assessment/Plan:  A 21 y.o. 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  The risks and benefits, rationale, treatment plan and multidisciplinary evaluation of liver transplantation were discussed with the patient.  Specifically, I explained living vs. deceased donor transplantation,  standard criteria vs. extended criteria livers.  We discussed organ allocation and waiting time in this region.  I reviewed our SRTR data as well as national SRTR data for liver transplantation. I reviewed the differences in graft and patient survival and discussed our 30 day, 1 year and 3 year survival results.  We discussed the benefits of transplantation including a significant survival benefit as well as a quality of life benefit.  I outlined the meaning of MELD score and its use in organ allocation.  Use of extended criteria livers carries a higher rate of long-term graft loss and delayed graft function.  I also explained the use and rationale of CDC or PHS increased high-risk livers which carry a risk of HIV, HBV or HCV infection of 1% with the use of nucleic acid testing.    We also discussed the operative risks including hepatic artery stenosis (1%), portal vein stenosis (1%), primary non function (1-5%), seroma (10%), wound infection, reoperation (30%), bile duct stenosis and stricture (10%) or leak and finally death from surgery (5%).  I also explained the long term risks of transplantation and immunosuppression including viral infection and cancer both solid organ and lymphoma.  We also discussed our liver transplant program here at [facility].  Our program performed 89 liver transplants in 2015, 107 in 2016, and 105 in 2017. The most recent SRTR report shows a 90% 1-year survival compared to a national survival of 90%. Our waitlist mortality is the lowest in the region currently at 4%.  We have more than 150 patients on the waitlist currently in all blood groups. Our program does function in an area of organ shortage though, as our wait list mortality continues to be around 7-10%.  Our median MELD score at OLT is around 25-28.  We share organs with [address] which is [place], [place] and [place].  We have a much higher than expected transplant rate as well for liver transplantation.   Our program has dedicated  specialists in all arenas and a comprehensive program in transplant surgery and medicine.        1. Needs timely listing and liver transplant as  is an excellent candidate for liver transplantation from a surgical standpoint.  Will need full evaluation and if no contraindications, will be discussed at our weekly multidisciplinary meeting to discuss his candidacy.  Patient and support are committed and enthusiastic about transplantation.  2. I answered questions she had and discussed different organs and options for her.    Marcie Mowers MD, [facility]  Professor of Surgery  Chief, Section of Transplantation   [employer]     12/20/2016 2:04 PM

## 2016-12-20 NOTE — Unmapped (Addendum)
Plan:  1.  Will complete a full serological evaluation of liver disease.  2.  Will get records from [facility] and plan to list in near future.   3.  Will request prior liver biopsy slides to review with our pathologist.  4.  Will check immunity to Hepatitis A and B.   5.  Would continue surveillance for HCC with AFP and ultrasound every 6 months  6.  EGD at [facility] on annual basis.   7.  Continue current dose of Cellcept 1000 mg twice daily. Will likely switch you in near future.   8.  Return in 2-3 months.

## 2016-12-20 NOTE — Progress Notes (Signed)
Financial Patient Screening  Organ: Liver  Primary Ins:  Designer, [employer]        Insured:  spouse                      Secondary Ins:   No                                    Insured:       COBRA:      n/a              Travel/Lodging yes/no: No  Medicare due to: No  No Medicare not worked Sports coach on dialysis: n/a  Income: 0 for patient  Patient [place] Citizen: Yes  Patient Veteran: No  Discussed Medication list/coverage: Yes-goes to [employer]  Medications required [place] post transplant: yes/no: No  Evaluation for patient/donors needs to be done at: [facility]   Minor Children in home: 0  Additional Comments:

## 2016-12-20 NOTE — Unmapped (Signed)
TRANSPLANT PSYCHOSOCIAL ASSESSMENT    Support Persons:  Luretha Eberly, husband, [phone]    Client's Perception of Medical Condition:   Patient is referred by Dr. Shawnie Dapper for liver transplant evaluation in the liver transplant clinic.  I met with her and her husband in our Multi D clinic to complete a liver transplant psychosocial assessment.    Patient presented as knowledgeable regarding her medical condition and treatment option.  Patient reported adherence to her current medical treatment and medications.  She reported zero missed doses of medications in the past 4 weeks.  Patient reported that she was seeing a dermatologist and placed on medications for severe acne.  When the dermatologist ran lab work in October 2016, patient was noted to have abnormally high liver enzymes.  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  MELD 16.     Patient endorsed symptoms that include fatigue and difficulty sleeping.  She reported that she has had slight abdominal and leg swelling in the past, but that this resolved with medications.  Patient reported feeling overwhelmed upon her diagnosis.  She reported that she was shocked at first, but has come to terms with it now.  She is not acquainted with anyone who has received a liver transplant.  She does not have any concerns regarding liver transplant.  She would like to proceed with work up.  Patient is currently listed at [facility], but is inactive on the wait list.  Patient's husband was transferred to [place] for employment.  Patient reported that she would like to be listed here and once she is, [facility] will remove her from their caseload.  Patient does not have any religious, ethnic, or personal objections to accepting blood products, having surgery, or receiving a transplant.  Patient states that she understands the process of liver transplant, and she is choosing liver  transplant as her treatment option.    PCP:  Dr. Victorino Dike Beverage  Pharmacy:  [name]    Support Plan:   Patient reported that her and her husband's families would come to town to provide care post-transplant.  Patient works full-time for the [employer] and is in the [employer].  He has limited time off available, but would apply for any time off that he was allowed to.  She has a written handout describing what type of support she will need post transplant.    Advance Directive packet provided to patient.  She is aware that this is needed prior to listing.      Psychiatric History:  Patient denies a history of diagnosis or treatment for psychiatric disorders.      Reviewed results of questionnaires with patient. (PHQ-9 score: 1, GAD-7 score: 0)  No concerns at this time regarding depression or anxiety.  Patient reported that when she is faced with a difficult time, she calls her mom to talk it through.  Patient did report that the move to [place] has been an adjustment.  This worker will continue to monitor for signs and symptoms of depression throughout pre-transplant process.     Tobacco/Alcohol/Other Drug:  She denies a history of alcohol or drug addiction.      Narcotic Pain Medication:  Patient is not currently taking any narcotic medication.    Past and Current Life / Social Situation:   Patient is a 21 year old married Caucasian female who resides with her husband.  She has a high school education. She currently does not work.  She was never in [employer].  Her hobbies include painting, reading, crocheting, and playing with her dogs.    Patient does not have income of her own.  They live off her husband's income.  Patient is insured through [employer].    Family History:   Patient and her husband have been married for 3 years.  They do not have children.  Patient was born in [place] and raised by her biological parents.  Both are living and in good health.   She has 3 brothers.  She does not have any communication with 2 of her brothers.  The brother with whom she does communicate, lives with her mom and has autism and is hearing impaired.    Rehabilitation Plans:   Patient has dreams of returning to school and earning a Bachelor's Degree.    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  Patient has identified her husband and well as her parents and in-laws as her caregivers post-transplant.  Patient does not have any psychiatric diagnoses.  She does not have any history of alcohol or substance abuse.    Patient is independent in self care.  She does not use any medical equipment nor does she have any home health services.  She is able to complete household tasks.  She has a valid driver's license, but prefers to let her husband drive.      Patient is of child bearing years and this worker inquired about future plans for having a family.  Patient and her husband both report that they very much look forward to having a family.  This worker informed patient that there would need to be open discussion with the transplant team regarding family planning as post-transplant medications may need to be changed at that point.  Patient verbalized understanding.  This worker e-mailed team regarding patient needing education/information regarding pregnancy and post-liver transplant medication regimen concerns or contraindications.  Dr. Landis Martins to address this with patient.      SW discussed psychosocial risks from the transplant, such as PTSD, generalized anxiety disorder, and anxiety about being dependent on others. I also explained that she may have guilt feelings. We discussed the potential psychiatric side effects of medications that she will be on during the post transplant phase. I explained  that these medications especially prednisone can cause mood swings and anxiety and gave the patient a chance to ask questions.     She has no psychosocial barriers at this time.    She has clear instructions of what she will need in terms of support and transportation post transplant.  She is aware of the plan, and she is agreeable with the plan.     SW will follow as needed in the transplant clinic. She has my contact name and phone number for additional needs.    Note routed to transplant nurse coordinator, Haskel Schroeder.    [name] LISW, CCTSW  12/20/2016  [phone]

## 2016-12-22 MED ORDER — ergocalciferol (ERGOCALCIFEROL) 50,000 unit capsule
1250 | ORAL_CAPSULE | ORAL | 3 refills | Status: AC
Start: 2016-12-22 — End: 2017-03-18

## 2017-01-03 ENCOUNTER — Inpatient Hospital Stay: Admit: 2017-01-03 | Payer: TRICARE (CHAMPUS)

## 2017-01-03 DIAGNOSIS — K7460 Unspecified cirrhosis of liver: Secondary | ICD-10-CM

## 2017-01-04 ENCOUNTER — Encounter

## 2017-01-14 ENCOUNTER — Encounter

## 2017-01-14 NOTE — Unmapped (Signed)
OSOTC summary sent via e-mail to Haskel Schroeder:    KP is a 21 year old married Caucasian female who resides with her husband.  She has a high school education. She currently does not work.  She was never in [employer].  Her hobbies include painting, reading, crocheting, and playing with her dogs.     KP and her husband have been married for 3 years.  They do not have children.  She was born in [place] and raised by her biological parents.  Both are living and in good health.  She has 3 brothers.  She does not have any communication with 2 of her brothers.  The brother with whom she does communicate, lives with her mom and has autism and is hearing impaired.    KP has no history of diagnosis or treatment of psychiatric disorders.  She does not have a history of alcohol or drug abuse.    KP???s parents and in-laws will provide 24 hour care post-transplant.  She states that she has a good understanding of liver transplant and is choosing this as her treatment option.

## 2017-01-17 ENCOUNTER — Ambulatory Visit: Admit: 2017-01-17 | Payer: TRICARE (CHAMPUS)

## 2017-01-17 ENCOUNTER — Ambulatory Visit: Admit: 2017-01-17 | Payer: TRICARE (CHAMPUS) | Attending: Infectious Disease

## 2017-01-17 DIAGNOSIS — Z01818 Encounter for other preprocedural examination: Secondary | ICD-10-CM

## 2017-01-17 DIAGNOSIS — K754 Autoimmune hepatitis: Secondary | ICD-10-CM

## 2017-01-17 LAB — HEPATIC FUNCTION PANEL, SERUM
ALT: 46 U/L (ref 7–52)
AST (SGOT): 66 U/L — ABNORMAL HIGH (ref 13–39)
Albumin: 3 g/dL — ABNORMAL LOW (ref 3.5–5.7)
Alkaline Phosphatase: 109 U/L (ref 36–125)
Bilirubin, Direct: 0.48 mg/dL — ABNORMAL HIGH (ref 0.00–0.40)
Bilirubin, Indirect: 1.32 mg/dL — ABNORMAL HIGH (ref 0.00–1.10)
Total Bilirubin: 1.8 mg/dL — ABNORMAL HIGH (ref 0.0–1.5)
Total Protein: 6.4 g/dL (ref 6.4–8.9)

## 2017-01-17 LAB — CBC
Hematocrit: 36.1 % (ref 35.0–45.0)
Hemoglobin: 12.1 g/dL (ref 11.7–15.5)
MCH: 30.4 pg (ref 27.0–33.0)
MCHC: 33.5 g/dL (ref 32.0–36.0)
MCV: 90.7 fL (ref 80.0–100.0)
MPV: 7.8 fL (ref 7.5–11.5)
Platelets: 95 10*3/uL (ref 140–400)
RBC: 3.99 10*6/uL (ref 3.80–5.10)
RDW: 16.5 % (ref 11.0–15.0)
WBC: 2.5 10*3/uL (ref 3.8–10.8)

## 2017-01-17 LAB — RENAL FUNCTION PANEL W/EGFR
Albumin: 3 g/dL — ABNORMAL LOW (ref 3.5–5.7)
Anion Gap: 5 mmol/L (ref 3–16)
BUN: 17 mg/dL (ref 7–25)
CO2: 27 mmol/L (ref 21–33)
Calcium: 8.7 mg/dL (ref 8.6–10.3)
Chloride: 104 mmol/L (ref 98–110)
Creatinine: 0.79 mg/dL (ref 0.60–1.30)
Glucose: 86 mg/dL (ref 70–100)
Osmolality, Calculated: 283 mosm/kg (ref 278–305)
Phosphorus: 2.7 mg/dL (ref 2.1–4.7)
Potassium: 3.9 mmol/L (ref 3.5–5.3)
Sodium: 136 mmol/L (ref 133–146)
eGFR AA CKD-EPI: >90 See note.
eGFR NONAA CKD-EPI: >90 See note.

## 2017-01-17 LAB — DIFFERENTIAL
Basophils Absolute: 30 /uL (ref 0–200)
Basophils Relative: 1.2 % (ref 0.0–1.0)
Eosinophils Absolute: 103 /uL (ref 15–500)
Eosinophils Relative: 4.1 % (ref 0.0–8.0)
Lymphocytes Absolute: 965 /uL (ref 850–3900)
Lymphocytes Relative: 38.6 % (ref 15.0–45.0)
Monocytes Absolute: 243 /uL (ref 200–950)
Monocytes Relative: 9.7 % (ref 0.0–12.0)
Neutrophils Absolute: 1160 /uL (ref 1500–7800)
Neutrophils Relative: 46.4 % (ref 40.0–80.0)
nRBC: 0 /100 WBC (ref 0–0)

## 2017-01-17 LAB — PROTIME-INR
INR: 1.5 — ABNORMAL HIGH (ref 0.9–1.1)
Protime: 18.1 s — ABNORMAL HIGH (ref 11.8–14.8)

## 2017-01-17 LAB — CYTOMEGALOVIRUS DNA, QUANT, RT PCR: CMV DNA Qnt: NOT DETECTED [IU]/mL

## 2017-01-17 LAB — AFP TUMOR MARKER: AFP-Tumor Marker: 3.4 ng/mL (ref 0.0–9.0)

## 2017-01-17 LAB — ABO/RH: Rh Type: POSITIVE

## 2017-01-17 MED ORDER — predniSONE (DELTASONE) 5 MG tablet
5 | ORAL_TABLET | ORAL | 6 refills | Status: AC
Start: 2017-01-17 — End: 2017-06-29

## 2017-01-17 NOTE — Unmapped (Signed)
Review of Systems   Constitutional: Negative.    HENT: Negative.    Eyes: Negative.    Respiratory: Negative.    Cardiovascular: Negative.    Gastrointestinal: Negative.    Genitourinary: Negative.    Musculoskeletal: Negative.    Skin: Negative.    Neurological: Negative.    Endo/Heme/Allergies: Negative.    Psychiatric/Behavioral: Negative.

## 2017-01-17 NOTE — Unmapped (Signed)
Get [place] vaccine records  Get blood work  Need other Pneumonia vaccine next November (Prevnar)  Return as needed

## 2017-01-17 NOTE — Unmapped (Signed)
Infectious Disease Pre-Liver Transplant Consultation    Patient: Tracy Watkins  MRN: [id number]  CSN: [id number]    Chief Complaint     Pre-transplant evaluation, infectious    History of Present Illness     Devoiry Corriher is a 21 y.o. 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    Childhood immunizations and illnesses: got all childhood vaccines    Places of habitation and travel: [place] and raised in [place], moved to [place] age 57, now back to [place], husband at [employer].  Lives with husband, 2 dogs.  No drug use, no international travel.  No TB exposure    Current vaccinations: Got PPSV23 (07/2016), flu, TDaP.  Immune to HAV, HBV    Significant infections:  On Doxy for acne, been on for 1 month, plan for at least another month.  Norovirus in December.  No SBP, BSI.      Review of Systems     Constitutional: denies fevers, chills, night sweats, fatigue, weight loss  Eyes: denies red or itchy eyes, eye discharge, visual changes   Ears, Nose, Mouth, and Throat: denies earache, nasal/sinus drainage, sore throat, mouth sores  Cardiovascular: denies chest pain, palpitations, edema  Respiratory: denies cough, shortness of breath, hemoptysis  Gastrointestinal: denies abdominal pain, nausea, vomiting, diarrhea   Musculoskeletal: denies arthritis, muscle aches, painful joints   Neurologic: denies headaches, weakness, decreased or altered sensation  Psychiatric: denies anxiety, depression  Integumentary: denies rash, + sores on skin     Past Medical History     Past Medical History:   Diagnosis Date   ??? Acne    ??? 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 Bilateral leg edema    ??? Cirrhosis (CMS Dx)    ??? Esophageal varices (CMS Dx) 05/2016    Grade 1/small, no history of bleeding   ??? PCOS (polycystic ovarian syndrome)        Past Surgical History     Past Surgical History:   Procedure Laterality Date   ??? LIVER BIOPSY     ??? UPPER  GASTROINTESTINAL ENDOSCOPY         Family History     Family History   Problem Relation Age of Onset   ??? Anesthesia problems Neg Hx        Social History     Social History     Social History   ??? Marital status: Married     Spouse name: N/A   ??? Number of children: N/A   ??? Years of education: N/A     Occupational History   ??? Not on file.     Social History Main Topics   ??? Smoking status: Never Smoker   ??? Smokeless tobacco: Never Used   ??? Alcohol use No   ??? Drug use: No   ??? Sexual activity: Not on file     Other Topics Concern   ??? Caffeine Use Yes   ??? Exercise Yes   ??? Seat Belt Yes     Social History Narrative   ??? No narrative on file       Medications     Outpatient Meds:  Current Outpatient Prescriptions   Medication Sig   ??? doxycycline Take 100 mg by mouth daily.             ??? ergocalciferol Take 1 capsule (50,000 Units total) by mouth once a week.   ???  hydroCHLOROthiazide Take 25 mg by mouth daily.   ??? mycophenolate Take 1,000 mg by mouth 2 times a day.   ??? potassium chloride Take 20 mEq by mouth 3 times a day.   ??? predniSONE Take 2 tabs a day for 4 weeks then 1.5 tab a day for 4 weeks then 1 tab daily   ??? spironolactone Take 50 mg by mouth daily.     No current facility-administered medications for this visit.        Vital Signs     Vitals:    01/17/17 1332   BP: 120/67   Resp: 16   Temp: 98.1 ??F (36.7 ??C)   SpO2: 100%       Physical Exam     General: well developed, well nourished, no acute distress, acne scars on face  Eyes: PERRL, conjunctiva clear, sclera anicteric   Ears, nose, mouth, throat: oropharynx without lesions or exudates, mucous membranes moist  Neck: supple, no LAD  Cardiovascular: RRR, normal S1, S2, no m/r/g, no edema  Respiratory: CTA, moving air well, breath sound symmetric, no cyanosis  GI: soft, nontender, nondistended, normal bowel sounds  Musculoskeletal: no swollen or erythematous joints  Skin: warm and dry, normal turgor, no rashes  Psychiatric: alert and oriented, follow commands,  thought content and affect appropriate    Laboratory Data     Lab Results   Component Value Date    WBC 2.4 (L) 12/20/2016     Lab Results   Component Value Date    HGB 12.6 12/20/2016     Lab Results   Component Value Date    PLT 96 (L) 12/20/2016     Lab Results   Component Value Date    ALT 52 12/20/2016     Lab Results   Component Value Date    AST 65 (H) 12/20/2016     Lab Results   Component Value Date    ALKPHOS 147 (H) 12/20/2016     Lab Results   Component Value Date    BILITOT 2.1 (H) 12/20/2016     Lab Results   Component Value Date    INR 1.5 (H) 12/20/2016     Lab Results   Component Value Date    CREATININE 0.70 12/20/2016     Lab Results   Component Value Date    EGFR 105.6 10/05/2016     Lab Results   Component Value Date    AFPTM 3.1 12/20/2016     Lab Results   Component Value Date    CMVIGG Positive (AA) 12/20/2016     Lab Results   Component Value Date    EBVVCAIGG Positive (AA) 12/20/2016     Lab Results   Component Value Date    VARICELLAIGG Positive (AA) 12/20/2016     Lab Results   Component Value Date    HIV12ABAGN Nonreactive 12/20/2016     Lab Results   Component Value Date    TREPIA Negative 12/20/2016     Lab Results   Component Value Date    QUANTIFERTB NEGATIVE 12/20/2016     Lab Results   Component Value Date    TOXOIGG <3.0 12/20/2016     Lab Results   Component Value Date    HEPAIGG Reactive (A) 12/20/2016     Lab Results   Component Value Date    HBSAB Reactive (A) 12/20/2016     Lab Results   Component Value Date    HEPBSAG Nonreactive 12/20/2016  Lab Results   Component Value Date    HEPBCAB Nonreactive 12/20/2016     Lab Results   Component Value Date    HCVAB Nonreactive 12/20/2016     Lab Results   Component Value Date    MUMPSIGG Positive 12/20/2016     Lab Results   Component Value Date    RUBELLAIGG Positive 12/20/2016     Lab Results   Component Value Date    RUBEOLAIGG Positive 12/20/2016     Diagnostic Studies     Abdominal CT/CTA: 06/10/2016   Cirrhosis and portal  hypertension.   No evidence for hepatocellular carcinoma.   14 mm patent portal vein.      Assessment & Plan   21 y.o. female with ESLD here for pre-transplant infectious evaluation    Encounter for pre-transplant evaluation for liver transplant  - TB, HIV, syphilis, HCV, Toxo negative  - HAV, HBV, MMR immune  - CMV, EBV positive, will need 3 months Valcyte post txp  - given pre-txp immune suppression for AIH, will send baseline CMV PCR to ensure no low level viremia  - will need 3-6 months PCP proph based on induction  - UTD on PPSV23, flu, TDap (will obtain records)  - needs PCV13 in 07/2017  - too young for Shingrix      Signed:  Lyanne Co, MD  01/17/2017, 2:38 PM

## 2017-01-17 NOTE — Assessment & Plan Note (Signed)
-   TB, HIV, syphilis, HCV, Toxo negative  - HAV, HBV, MMR immune  - CMV, EBV positive, will need 3 months Valcyte post txp  - given pre-txp immune suppression for AIH, will send baseline CMV PCR to ensure no low level viremia  - will need 3-6 months PCP proph based on induction  - UTD on PPSV23, flu, TDap (will obtain records)  - needs PCV13 in 07/2017  - too young for Shingrix

## 2017-01-17 NOTE — Unmapped (Signed)
ANESTHESIOLOGY CONSULTATION AND PRE-OPERATIVE HISTORY AND PHYSICAL       Subjective:      [facility] Attending Physician: Lolita Cram, MD  [facility] NP / PA:   Merla Riches, CNP    Date of Surgery:  Pending  Surgeon:  Dr. Sherryll Burger  Diagnosis:  ESLD  Procedure:  Liver Transplant    Patient ID: Tracy Watkins is a 21 y.o. female.    Patient is being seen today at the request of Dr. Sherryll Burger to render an opinion on perioperative risk optimization and to coordinate medical care as necessary prior to the following procedure:  Liver Transplant.    Chief Complaint   Patient presents with   ??? 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 She had a biopsy in June 2017. Initially discovered with elevated LFTs. Negative abdominal ultrasound for HCC screening 05/2016. She has intermittent RUQ pain described as sharp/stabbing in quality, rated 5/10 in severity. No aggravating or alleviating factors. She does bruise easily. Denies jaundice.    MELD-Na score: 16 at 12/20/2016  2:34 PM  Calculated from:  Serum Creatinine: 0.70 mg/dL (Rounded to 1) at 09/18/1094  2:34 PM  Serum Sodium: 135 mmol/L at 12/20/2016  2:34 PM  Total Bilirubin: 2.1 mg/dL at 0/12/5407  8:11 PM  INR(ratio): 1.5 at 12/20/2016  2:34 PM  Age: 25 years    Ascites/edema: Has LE edema on HCTZ. Also taking Aldactone for acne  GI bleeding/Esophageal varices: EGD 05/2016 with small non-bleeding varices, mild portal hypertensive gastropathy  Hepatic Encephalopathy: denies  Coagulation: platelets 96K, INR 15 on 12/2016    Chronic Medical Conditions, Severity, Optimization:  See below    Duke Activity Scale:  5 - Walking four miles per hour; social dancing; washing a car.      Medical History:     Past Medical History:   Diagnosis Date   ??? Acne    ??? 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 Bilateral leg edema    ??? Cirrhosis (CMS Dx)    ??? Esophageal varices (CMS Dx) 05/2016    Grade 1/small, no history of bleeding   ???  PCOS (polycystic ovarian syndrome)        Surgical History:     Past Surgical History:   Procedure Laterality Date   ??? LIVER BIOPSY     ??? UPPER GASTROINTESTINAL ENDOSCOPY         Family History:     Family History   Problem Relation Age of Onset   ??? Anesthesia problems Neg Hx        Social History:     Social History     Social History   ??? Marital status: Married     Spouse name: N/A   ??? Number of children: N/A   ??? Years of education: N/A     Occupational History   ??? Not on file.     Social History Main Topics   ??? Smoking status: Never Smoker   ??? Smokeless tobacco: Never Used   ??? Alcohol use No   ??? Drug use: No   ??? Sexual activity: Not on file     Other Topics Concern   ??? Caffeine Use Yes   ??? Exercise Yes   ??? Seat Belt Yes     Social History Narrative   ??? No narrative on file       Allergies:  Allergies   Allergen Reactions   ??? Penicillins    ??? Latex Rash       Medications:     Prior to Admission medications taking for visit date 01/17/17   Medication Sig Taking? Authorizing Provider   doxycycline (DORYX) 100 MG EC tablet Take 100 mg by mouth daily.           Yes Historical Provider, MD   ergocalciferol (ERGOCALCIFEROL) 50,000 unit capsule Take 1 capsule (50,000 Units total) by mouth once a week. Yes Marina Goodell, MD   hydroCHLOROthiazide (HYDRODIURIL) 25 MG tablet Take 25 mg by mouth daily. Yes Historical Provider, MD   mycophenolate (CELLCEPT) 500 mg tablet Take 1,000 mg by mouth 2 times a day. Yes Historical Provider, MD   potassium chloride (KLOR-CON) 10 MEQ CR tablet Take 20 mEq by mouth 3 times a day. Yes Historical Provider, MD   spironolactone (ALDACTONE) 50 MG tablet Take 50 mg by mouth daily. Yes Historical Provider, MD   potassium chloride (KCL) 20 mEq/15 mL solution Take 20 mEq by mouth 3 times a day.  Historical Provider, MD          Review of Systems   Constitutional: Negative for activity change, appetite change, chills, fatigue, fever, weight gain and weight loss.   HENT: Negative for dental  problem, ear pain, mouth sores, sore throat and trouble swallowing.    Eyes: Negative for pain and discharge.   Respiratory: Negative for apnea, cough, chest tightness, shortness of breath and wheezing.    Cardiovascular: Positive for leg swelling ( stable on medication). Negative for chest pain and palpitations.        Denies orthopnea  Echo for pre-transplant normal   Gastrointestinal: Positive for abdominal pain (RUQ, mild). Negative for blood in stool, constipation, diarrhea, heartburn, nausea and vomiting.        Liver cirrhosis secondary to autoimmune hepatitis   Genitourinary: Negative for difficulty urinating, dysuria and hematuria.   Musculoskeletal: Positive for arthralgias ( bilateral hips and BLE) and back pain ( low). Negative for gait problem, joint swelling, neck pain and neck stiffness.   Skin: Negative for color change, rash and wound.        Facial acne   Neurological: Positive for headaches (temporal, occipital). Negative for dizziness, syncope, weakness, light-headedness and numbness.   Hematological: Negative for adenopathy. Bruises/bleeds easily.        Denies h/o DVT/PE   Psychiatric/Behavioral: Positive for sleep disturbance (occasionally). Negative for confusion, depression and dysphoric mood. The patient is not nervous/anxious.        Objective:   Blood pressure 120/67, pulse 90, temperature 98.1 ??F (36.7 ??C), temperature source Oral, height 5' 7 (1.702 m), weight 195 lb (88.5 kg), last menstrual period 10/20/2015, SpO2 100 %.    Physical Exam   Vitals reviewed.  Constitutional: She is oriented to person, place, and time. She appears well-developed and well-nourished. No distress.   Body mass index is 30.54 kg/m??.     HENT:   Head: Normocephalic and atraumatic.   Right Ear: Hearing and external ear normal.   Left Ear: Hearing and external ear normal.   Nose: Nose normal.   Mouth/Throat: Uvula is midline, oropharynx is clear and moist and mucous membranes are normal.   Eyes: Conjunctivae,  EOM and lids are normal. Pupils are equal, round, and reactive to light. No scleral icterus.   Neck: Trachea normal and normal range of motion. Neck supple. No JVD present. No tracheal deviation present. No thyromegaly  present.   Cardiovascular: Normal rate, regular rhythm, S1 normal, S2 normal and normal heart sounds.  Exam reveals no gallop and no friction rub.    No murmur heard.  Pulses:       Carotid pulses are 2+ on the right side, and 2+ on the left side.       Radial pulses are 2+ on the right side, and 2+ on the left side.        Dorsalis pedis pulses are 2+ on the right side, and 2+ on the left side.   Pulmonary/Chest: Effort normal and breath sounds normal. No stridor. No respiratory distress. She has no wheezes. She has no rhonchi. She has no rales.   Abdominal: Soft. Bowel sounds are normal. She exhibits no mass. There is tenderness ( RUQ).   Musculoskeletal: Normal range of motion. She exhibits edema (trace non-pitting BLE ). She exhibits no tenderness.   MAE x 4 equal strength   Lymphadenopathy:     She has no cervical adenopathy.   Neurological: She is alert and oriented to person, place, and time. She has normal strength. No cranial nerve deficit or sensory deficit.   Skin: Skin is warm and dry. She is not diaphoretic. No cyanosis. Nails show no clubbing.   Psychiatric: She has a normal mood and affect. Her speech is normal and behavior is normal. Judgment and thought content normal.       Airway:  Mallampati I (soft palate, uvula, fauces, and tonsillar pillars visible), Thyromental distance 3 finger breadths, opening 3 finger breadths. Full neck ROM. Teeth in good repair. No obvious cracked or loose teeth. No dentures.       Lab Review:     Lab Results   Component Value Date    WBC 2.4 (L) 12/20/2016    HGB 12.6 12/20/2016    HCT 38.4 12/20/2016    MCH 30.0 12/20/2016    PLT 96 (L) 12/20/2016    GLUCOSE 78 12/20/2016    CREATININE 0.70 12/20/2016    NA 135 12/20/2016    K 4.0 12/20/2016    CL 105  12/20/2016    CO2 25 12/20/2016    BILITOT 2.1 (H) 12/20/2016    PROT 7.2 12/20/2016    AST 65 (H) 12/20/2016    ALT 52 12/20/2016    ALKPHOS 147 (H) 12/20/2016    CHOLTOT 171 12/20/2016    LDL 97 12/20/2016    HDL 62 12/20/2016    TRIG 61 12/20/2016    PROTIME 17.9 (H) 12/20/2016         Study Results:   Study Results:    Echo w/ Bubble 05/2016: OSH, [place] (available in Care Everywhere)  CONCLUSIONS   1. The biplane LVEDV and LVESV indices are normal (54 ml/m???? and 22   ml/m????, respectively).   2. The LV mass index is normal (79 g/m????). The relative wall thickness is   0.35, consistent with normal LV geometry.   3. Global and regional LV systolic function are normal (LVEF = 60%).   4. Normal pattern of LV diastolic filling. The averaged medial and   lateral mitral tissue Doppler E/e' ratio is normal (5), consistent with   normal LV compliance and/or normal mean LA pressure.   5. The RV diastolic volume, wall thickness and global systolic function   are normal.   6. The LA volume index is normal (26 ml/m????).   7. No evidence of ASD/PFO or intrapulmonary shunting on the basis of  color flow Doppler and agitated saline contrast study.   8. The RA volume index is normal (33 ml/m????). The IVC diameter is normal   with normal respiratory variation.   9. Mild mitral valve regurgitation.  10. Mild tricuspid regurgitation.  11. Right Heart Hemodynamics = Based on the IVC diameter, respiratory   variation and RA volume, the est. mean RA pressure is normal (3 mmHg).   Based on the peak, resting TR gradient of 18 mmHg and the est. mean RA   pressure of 3 mmHg, the est. RVSP is 21 mmHg, which is normal. Based on   the PR end diastolic gradient of 3 mmHg and the est. mean RA pressure of 3   mmHg, the est. PA diastolic pressure is 6 mmHg. The est. PA pressure is 21   mmHg/6 mmHg, with a calculated PA mean pressure of 11 mmHg, which is   normal.  12. No comparison study is available at this time.    CT Abdomen 05/2016: OSH,  Care Everywhere:  IMPRESSION:   ????   Cirrhosis and portal hypertension.    No evidence for hepatocellular carcinoma.    14 mm patent portal vein.    ASA Physical Status:   ASA Physical Status:  4       Assessment and Recommendations:   This is a 21 yo female with ESLD who is to undergo a Liver Transplant under [place].    Concurrent Medical Conditions Include:    1. Cardiac risk: Denies h/o CAD, CHF. Denies h/o CP/dyspnea. Pre-transplant work-up included an Echo w/bubble 05/2016 in [place] with EF 60%, normal diastolic function, normal RV systolic function, no evidence of ASD or PFO or intrapulmonary shunting, mild MR, mild TR, RVSP 21, PA mean pressure 11 mmHg. No significant risk factors for CAD.  She reports a good functional status as she walks 5x/week for at least 30-40 minutes without CP or dyspnea. Denies orthopnea. LE edema is stable on diuretics. Duke Activity 5. Physical exam benign. She as previously evaluated by OSH Cardiology, Dr. Dondra Spry, 05/2016 for pre-transplant evaluation who did not recommend further testing given normal Echo and young age.  Low cardiac risk for elevated risk procedure. I agree with the decision to defer stress testing given her age and functional capacity.    2. LE edema: taking HCTZ. BP today 120/67.     3. Liver cirrhosis: secondary to Autoimmune Hepatitis.  Dx by biopsy June 2017. Initially treated with high dose prednisone (since tapered off) and then Imuran however she is now currently managed on Cellcept.   MELD-Na score: 16 at 12/20/2016  2:34 PM  Calculated from:  Serum Creatinine: 0.70 mg/dL (Rounded to 1) at 0/05/8118  2:34 PM  Serum Sodium: 135 mmol/L at 12/20/2016  2:34 PM  Total Bilirubin: 2.1 mg/dL at 09/17/7827  5:62 PM  INR(ratio): 1.5 at 12/20/2016  2:34 PM  Age: 8 years    3a. Ascites/edema: Has LE edema on HCTZ. Also taking Aldactone for acne   3b. GI bleeding/Esophageal varices: EGD 05/2016 with small non-bleeding varices, mild portal hypertensive gastropathy   3c. Hepatic  Encephalopathy: denies   3d. Coagulation: platelets 96K, INR 15 on 12/2016. +easy bruising    4. PCOS: irregular menstrual periods. LMP 10/2015.     5. Acne: taking doxycycline and Aldactone.     6. Obesity: Body mass index is 30.54 kg/m??.    7. Latex Allergy: will need Latex-free room set-up.     M. Bessler, CNP  Attending Attestation  and Medical Decision Making    I have seen and examined this patient along with Merla Riches, CNP.  I have reviewed the above note as well as relevant laboratory data, imaging data and pertinent elements of the patient???s medical record.    Recommendations for upcoming procedure include:  Type of Anesthetic: GETA  IV Access: PIV, MAC  Monitors: art line, PAC  Analgesia: multimodal  Postoperative Disposition: ICU intubated    Special considerations for this patient and their upcoming procedure-none    The patient and/or family member present verbalized understanding of surgery and anesthetic plan and agree to proceed.  Discussed bleeding/transfusion, planned postop mechanical ventilation and risk of death. All QA.    I have communicated my recommendations for the perioperative care of this patient with the surgeon through a shared EMR.    Lorayne Marek, MD  01/17/2017, 11:40 AM

## 2017-01-17 NOTE — Unmapped (Addendum)
-   Start prednisone 10 mg daily and taper as per instructions. Will continue 5 mg daily dose for long term  - Continue Imuran 1000 mg twice a day  - Continue aldactone 50 mg daily  - Avoid aspirin, Ibuprofen, Aleeve, Motrin since these medications can increase the risk of bleeding and can cause kidney damage.  - Can take Tylenol up to 2000 mg in a 24 hour period.  - Complete abstinence from alcohol.  - Transplant follow up in 4 months

## 2017-01-17 NOTE — Unmapped (Signed)
Transplant Hepatology Follow Up    HPI:                                                                                                                             This is a follow up visit for Tracy Watkins, 21 y.o. 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 The patient was diagnosed with AIH in June 2017 and was listed for liver transplant at [facility] in [place]. 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 Her liver tests were elevated - AST-266, ALT-210, ALP-255, TB-2.8 with imaging suggestive of cirrhosis. Her serological evaluation was strongly suggestive of AIH - + ANA, ASMA-74, IgG-3200, AMA-negative. She underwent liver biopsy in June 2017 which revealed underlying cirrhosis with severe hepatitis - predominantly lymphoplasmacytic infiltrate with interface activity, hepatocyte dropout, incomplete granulmomas, and bile ductular proliferation. As a result, she was initiated on high dose prednisone 60 mg and eventually azathioprine 100 mg was added in late July 2017. Due to plateaued liver tests, she was transitioned to Cellcept 1 gram BID in August / Sept 2017 and tapered rapidly off of prednisone. Other than mild jaundice, she is not clinically decompensated without any GI bleeding, ascites or confusion. Her EGD in Sept 2017 had evidence of small varices. Her evaluation for transplant was unremarkable in [place]. She moved to [place], [place] in Jan 2018 due to husband's job in [employer].  She presents today for follow up. She has completed her pre-transplant testing. Most of the work up has been transferred from [facility]. A recent DXA scan was normal.  She reports occasional joint pain, easy bruisability and nose bleeds.     ROS:  10 out of 14 point review of systems was done, see MA note.    Past Medical History:  Past  Medical History:   Diagnosis Date   ??? Acne    ??? 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 Bilateral leg edema    ??? Cirrhosis (CMS Dx)    ??? Esophageal varices (CMS Dx) 05/2016    Grade 1/small, no history of bleeding   ??? PCOS (polycystic ovarian syndrome)      Allergies:  Allergies   Allergen Reactions   ??? Penicillins    ??? Latex Rash     Medications:  Current Outpatient Prescriptions   Medication Sig   ??? doxycycline Take 100 mg by mouth daily.             ??? ergocalciferol Take 1 capsule (50,000 Units total) by mouth once a week.   ??? hydroCHLOROthiazide Take 25 mg by mouth daily.   ??? mycophenolate Take 1,000 mg by mouth 2 times a day.   ??? potassium chloride Take 20 mEq by mouth 3 times a day.   ??? spironolactone Take 50 mg by  mouth daily.     Physical Examination:  Vitals:    01/17/17 1335   BP: 120/67   BP Location: Left arm   Patient Position: Sitting   Resp: 16   Temp: 98.1 ??F (36.7 ??C)   TempSrc: Oral   SpO2: 100%   Weight: 195 lb (88.5 kg)   Height: 5' 7 (1.702 m)   General: Young female, NAD  HENT: Pupils b/l reactive to light, sclera anicteric, moist mucous membranes  Neck:Supple, no thyromegaly, no lymphadenopathy  Pulmonary: Good air entry b/l, lungs b/l clear to auscultation,   Cardiac:RRR, S1 and S2 normal  Abdomen:Soft, ND, NT, active bowel sounds, no hernia  Neuro: Awake, alert, oriented x 3, no asterixis  Extremities: Distal pulses 2+, No LE edema  Skin: No rash or jaundice    Labs:  Lab name 12/20/16  1434   HEMOGLOBIN 12.6   HEMATOCRIT 38.4   MEAN CORPUSCULAR VOLUME 91.1   PLATELETS 96*   SODIUM 135   POTASSIUM 4.0   CHLORIDE 105   CO2 25   BUN 17   CREATININE 0.70   GLUCOSE 78   PHOSPHORUS 2.9   ALBUMIN 3.3*   3.3*   CALCIUM 9.3   AST 65*   ALT 52   BILIRUBIN TOTAL 2.1*   ALK PHOS 147*   INR 1.5*     Assessment and Plan:  40 Y female with 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    - Autoimmune hepatitis: She was on Imuran (max dose 100 mg) before being switched to Cellcept 1000 mg BID. LFTs  are mildly elevated. She might benefit from additional immune suppression. Also has joint pains. Will start on prednisone 10 mg daily and slowly taper to 5 mg daily which should be maintained long term. Although patient has side effects from prednisone, she is wiling to try low dose now.  - Variceal screening/surveillance/prophylaxis: EGD for follow up of small varices will be due in Sep 2018  - HCC screening: Will recommend AFP and Ultrasound every 6 months  - Immunizations: Immune to Hep A and B  - Transplant status: Current MELD score is 15. She has competed her pre-transplant testing. Her testing will be reviewed in weekly liver selection meeting for official listing tomorrow.  - Transplant follow up in 4 months    Tillman Abide, MD

## 2017-01-18 NOTE — Unmapped (Addendum)
Transplant Pharmacist Evaluation    Current Outpatient Prescriptions   Medication Sig   ??? doxycycline (DORYX) 100 MG EC tablet Take 100 mg by mouth daily.             ??? ergocalciferol (ERGOCALCIFEROL) 50,000 unit capsule Take 1 capsule (50,000 Units total) by mouth once a week.   ??? hydroCHLOROthiazide (HYDRODIURIL) 25 MG tablet Take 25 mg by mouth daily.   ??? mycophenolate (CELLCEPT) 500 mg tablet Take 1,000 mg by mouth 2 times a day.   ??? potassium chloride (KLOR-CON) 10 MEQ CR tablet Take 20 mEq by mouth 3 times a day.   ??? predniSONE (DELTASONE) 5 MG tablet Take 2 tabs a day for 4 weeks then 1.5 tab a day for 4 weeks then 1 tab daily   ??? spironolactone (ALDACTONE) 50 MG tablet Take 50 mg by mouth daily.     No current facility-administered medications for this visit.        Patient was presented for liver transplantation at the patient care meeting today.  I was present for her presentation and discussion and have reviewed their medications (OARRS report reviewed; patient not identified).  No pharmacologic contraindications to transplant were identified.

## 2017-01-18 NOTE — Unmapped (Signed)
Tracy Watkins was presented at the Liver Transplant Selection Meeting on 01/18/2017 with a diagnosis of Autoimmune Hepatitis and approved for liver transplantation and placement on the [facility].  This patient meets the [facility] Liver Transplant Program???s standard selection criteria for liver transplantation.  A list of the multidisciplinary team members attending and their roles is available through the pre-liver transplant office.

## 2017-02-03 NOTE — Unmapped (Signed)
Reminder call to patient that we still need her Advance Directives in order to list for transplant.  She is out of town and states she will work on it upon her return.

## 2017-02-24 NOTE — Unmapped (Signed)
UNOS VERIFICATION CHECK FORM      First Name  jf      Last Name  jf      Middle Initial  n/a     Date of Birth  jf      SS#   jf    Center ID# (MRN)  jf    ABO x 2  jf/jf  I have verified the two (2) blood type results for this candidate are the same blood type and match the results reported in UNet.    Listing date in Epic jf  And UNOS match    Listing date notification jf  letter match Epic and   UNOS    Physical Capacity     jf    Working for Income     jf

## 2017-02-24 NOTE — Unmapped (Signed)
UNOS VERIFICATION CHECK FORM      First Name  mw      Last Name  mw      Middle Initial  mw     Date of Birth  mw      SS#   mw    Center ID# (MRN)  mw    ABO x 2  mw/mw  I have verified the two (2) blood type results for this candidate are the same blood type and match the results reported in UNet.    Listing date in Epic mw  And UNOS match    Listing date notification mw  letter match Epic and   UNOS

## 2017-03-01 NOTE — Unmapped (Signed)
Patient called and having difficulty swallowing the potassium supplement.  Wanted to know if it was necessary to continue to take.  Discussed with Dr. Boykin Nearing and okay to discontinue.  Will check potassium level in 10 days.

## 2017-03-09 NOTE — Telephone Encounter (Signed)
Spoke with the patient and she will be going Friday to have labs done at [facility].

## 2017-03-14 ENCOUNTER — Ambulatory Visit: Admit: 2017-03-14 | Payer: TRICARE (CHAMPUS)

## 2017-03-14 DIAGNOSIS — K7460 Unspecified cirrhosis of liver: Secondary | ICD-10-CM

## 2017-03-14 LAB — STREP A DNA - AMPLIFIED: Strep A DNA - Amplified: NEGATIVE

## 2017-03-14 MED ORDER — azaTHIOprine (IMURAN) 50 mg tablet
50 | ORAL_TABLET | Freq: Every day | ORAL | 4 refills | Status: AC
Start: 2017-03-14 — End: 2017-03-15

## 2017-03-14 NOTE — Unmapped (Signed)
Subjective:       Tracy Watkins is a 21 y.o. 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 She is not clinically decompensated. She was last seen several months ago.    The patient was diagnosed with AIH in June 2017 and was listed for liver transplant at [facility]) in [place]. 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 Her liver tests were elevated - AST-266, ALT-210, ALP-255, TB-2.8 with imaging suggestive of cirrhosis. Her serological evaluation was strongly suggestive of AIH - + ANA, ASMA-74, IgG-3200, AMA-negative. She underwent liver biopsy in June 2017 which revealed underlying cirrhosis with severe hepatitis - predominantly lymphoplasmacytic infiltrate with interface activity, hepatocyte dropout, incomplete granulmomas, and bile ductular proliferation. As a result, she was initiated on high dose prednisone 60 mg and eventually azathioprine 100 mg was added in late July 2017. Due to plateaued liver tests, she was transitioned to Cellcept 1 gram BID in August / Sept 2017 and tapered rapidly off of prednisone. Other than mild jaundice, she is not clinically decompensated without any GI bleeding, ascites or confusion. Her EGD in Sept 2017 had evidence of small varices. Her evaluation for transplant was unremarkable in [place]. She moved to [place], [place] in Jan 2018 due to husband's job in [employer]. She is immune to Hep A/B.     In the interim, she completed her evaluation and was listed for OLT last month. She remains on Cellcept and prednisone 5 mg daily. Repeat liver tests were stable - but still mildly elevated - ALT-46, AST-54, ALP-108, TB-1.3. Her and husband are considering pregnancy in near future.     The following portions of the patient's history were reviewed  and updated as appropriate: allergies, current medications, past family history, past medical history, past social history, past surgical history and problem list.    Comprehensive review of systems performed.  See scanned review of systems form for details. An extensive review of past records including prior office notes, labs, imaging, and pathology reports was performed from the Media tab, Care Everywhere and paper documents.      Objective:    Physical Exam   Nursing note and vitals reviewed.  Constitutional: She is oriented to person, place, and time. She appears well-developed and well-nourished. No distress.   HENT:   Head: Normocephalic and atraumatic.   Mouth/Throat: Oropharynx is clear and moist. No oropharyngeal exudate.   Eyes: Conjunctivae are normal. Pupils are equal, round, and reactive to light. No scleral icterus.   Neck: Normal range of motion. Neck supple. No tracheal deviation present. No thyromegaly present.   Cardiovascular: Normal rate, regular rhythm and normal heart sounds.    Pulmonary/Chest: Effort normal and breath sounds normal. No respiratory distress. She has no wheezes.   Abdominal: Soft. Bowel sounds are normal. She exhibits no distension and no mass. There is no tenderness.   Musculoskeletal: Normal range of motion. She exhibits no edema.   Lymphadenopathy:     She has no cervical adenopathy.   Neurological: She is alert and oriented to person, place, and time. No cranial nerve deficit.   No asterixis   Skin: Skin is warm and dry. No erythema.   + spider angiomata   Psychiatric: She has a normal mood and affect. Her behavior is normal. Thought content normal.  Labs: The patient's recent laboratory results were reviewed.      Assessment & Plan:       21 year old Caucasian female with cirrhosis due to AIH listed for OLT here for follow-up.     1) Cirrhosis due to AIH: The patient is listed for OLT with low MELD of 9. She is not clinically decompensated and wishes to get pregnant.  We discussed she would have to transition off the Cellcept onto Imuran and would be placed on hold for transplant for 1 year after pregnancy. No MELD labs today. Will obtain records from [facility].   Immune to Hep A/B.   Would continue surveillance for HCC with AFP and ultrasound every 6 months EGD at [facility] on annual basis.   Due consideration of pregnancy, will stop Cellcept. Discussed that will be unable to transplant for 1 year after pregnancy.   Start Azathioprine 100 mg daily. Repeat labs in 2 weeks. Continue Prednisone 5 mg. Send Strep A testing. Please discuss with PCP. Return in 2-3 months.    Plan:  1.  No MELD labs today. Will obtain records from [facility].   2.  Immune to Hep A/B.   3.  Would continue surveillance for HCC with AFP and ultrasound every 6 months  4.  EGD at [facility] on annual basis.   5.  Due consideration of pregnancy, will stop Cellcept. Discussed that will be unable to transplant for 1 year after pregnancy.   6.  Start Azathioprine 100 mg daily. Repeat labs in 2 weeks. Continue Prednisone 5 mg.   7.  Send Strep A testing. Please discuss with PCP.  8.  Return in 2-3 months.

## 2017-03-14 NOTE — Unmapped (Signed)
Review of Systems   Constitutional: Negative.    HENT: Positive for sore throat.    Eyes: Negative.    Respiratory: Negative.    Cardiovascular: Negative.    Gastrointestinal: Negative.    Genitourinary: Negative.    Musculoskeletal: Negative.    Skin: Negative.    Neurological: Negative.    Endo/Heme/Allergies: Negative.    Psychiatric/Behavioral: Negative.        Patient states she is just not feeling well today.

## 2017-03-14 NOTE — Unmapped (Addendum)
Plan:  1.  No MELD labs today. Will obtain records from [facility].   2.  Immune to Hep A/B.   3.  Would continue surveillance for HCC with AFP and ultrasound every 6 months  4.  EGD at [facility] on annual basis.   5.  Due consideration of pregnancy, will stop Cellcept. Discussed that will be unable to transplant for 1 year after pregnancy.   6.  Start Azathioprine 100 mg daily. Repeat labs in 2 weeks. Continue Prednisone 5 mg.   7.  Send Strep A testing. Please discuss with PCP.  8.  Return in 2-3 months.

## 2017-03-15 MED ORDER — azaTHIOprine (IMURAN) 50 mg tablet
50 | ORAL_TABLET | Freq: Every day | ORAL | 4 refills | Status: AC
Start: 2017-03-15 — End: 2017-04-06

## 2017-03-17 NOTE — Unmapped (Signed)
Labs reviewed.  Potassium remains stable at 3.99.  Will continue without potassium supplement.

## 2017-03-18 MED ORDER — ergocalciferol (ERGOCALCIFEROL) 50,000 unit capsule
1250 | ORAL_CAPSULE | ORAL | 3 refills | Status: AC
Start: 2017-03-18 — End: 2017-08-09

## 2017-04-01 NOTE — Unmapped (Signed)
Message from Dr. Landis Martins.  Would like the following labs:  CBC, Renal, Hepatic, PT and INR.  Labs ordered and patient to go this afternoon.

## 2017-04-01 NOTE — Telephone Encounter (Signed)
We need her to get CBC and repeat liver labs. Will likely need to go up on Imuran.

## 2017-04-04 NOTE — Unmapped (Signed)
Please increase Imuran to 125 mg daily. Repeat CBC, renal and hepatic profile in 2 weeks.

## 2017-04-05 NOTE — Unmapped (Signed)
Labs results received. Message to Dr. Landis Martins to please review and advise is adjustment needs to be made on Imuran.

## 2017-04-06 NOTE — Unmapped (Signed)
Medication change reviewed with patient.  Will re-check labs in two weeks.

## 2017-04-07 MED ORDER — azaTHIOprine (IMURAN) 50 mg tablet
50 | ORAL_TABLET | Freq: Every day | ORAL | 4 refills | Status: AC
Start: 2017-04-07 — End: 2017-09-28

## 2017-04-18 NOTE — Unmapped (Signed)
I spoke with [name] and she said she will be back in town on Wednesday and she will go and have labs done then. I faxed the orders over to [facility]

## 2017-04-20 NOTE — Unmapped (Signed)
Called the lab and they said that they had labs that were ordered under there MD and could not release them to me. I advised that I faxed the orders for [place] on Monday and the fax went through. She said she could not find the orders. I was placed on hold for 30 minutes, When I hung up and called back they were gone for the day. I will follow up tomorrow

## 2017-04-21 NOTE — Unmapped (Signed)
Labs repeated and results sent to Dr. Landis Martins for follow up.  MELD lab values were successfully updated in UNet and were accurately updated with the most recent labs. The new MELD Score is 13.

## 2017-04-26 NOTE — Progress Notes (Signed)
No response from Dr. Landis Martins.  Message sent again to please review labs and advise on any changes to Imuran.

## 2017-04-27 NOTE — Unmapped (Signed)
Message received from Dr. Landis Martins.  Liver tests are improving. Would get labs again next week. She will need a CBC with differential. Spoke with patient and updated on plan.  No questions at this time.

## 2017-04-28 NOTE — Unmapped (Signed)
Patient and caregiver attended the Liver Transplant Listing Class on Friday, 04/22/2017. All were attentive and receptive to learning, asking appropriate questions.

## 2017-05-03 NOTE — Unmapped (Signed)
Patient called with request to stop Prednisone.  States she is symptomatic (increased weight, swelling, increased acne and not sleeping at night).  Message sent to Dr. Boykin Nearing to advise.

## 2017-05-04 NOTE — Telephone Encounter (Signed)
I spoke with Tracy Watkins and advised that I was faxing orders over for the patient. I spoke with [name] and she said she would be going today to have the labs done.

## 2017-05-05 NOTE — Telephone Encounter (Signed)
Message from Dr. Landis Martins.  Labs stable; repeat in two weeks.

## 2017-05-05 NOTE — Telephone Encounter (Signed)
Message received from Dr. Landis Martins.  Felt symptoms are not related to the low dose prednisone.  Advised patient to follow up with PCP.  Call to patient and updated on recommendations.  Recent labs reviewed and forwarded to Dr. Landis Martins for review as well.

## 2017-05-10 NOTE — Telephone Encounter (Signed)
Patient recently saw PCP and was prescribed naproxen.  Wanted to know if it was okay to take.  Discussed with our team and she should not take naproxen.  Advised okay to take Tylenol up to 4 extra strength per day.  Verbalized understanding.

## 2017-05-26 NOTE — Progress Notes (Signed)
Call received from Laverta Baltimore from [facility].  They would like to prescribe Naproxen for joint pain.  Per Dr. Linna Darner and Dr. Landis Martins we do not recommend Naproxen.  Would recommend Tylenol or Tylenol products up to 2 grams per day.  Laverta Baltimore would like documentation for this for they feel Tylenol is not safe.  Faxed AASLD guidelines per request.  Patient updated on plan.

## 2017-05-30 NOTE — Telephone Encounter (Signed)
I got a call from [place] about [employer]. He advised that she was haing the same signs and symptoms that she had before when she was on it. He said she was hiving really bad acne breakouts and bad weight fluxation. I sent a message to the cordinator. He also asked about the letter from the [place] wait list. He wanted to make sure she was still on out list. I advise him that she was and it was only from [place].

## 2017-05-30 NOTE — Telephone Encounter (Signed)
I spoke with Tracy Watkins and he will be faxing over labs to [place].

## 2017-05-31 NOTE — Telephone Encounter (Signed)
Message left with husband.  Prednisone has been previously discussed with patient.  Dr. Landis Martins does not feel the current dose would cause the symptoms described.  Does not want to discontinue prednisone.

## 2017-06-13 NOTE — Unmapped (Signed)
Call received from patient.  Would like to know when lab work is due again.  Will message Dr. Landis Martins again to see when he would like labs repeated.  Patient also states she followed up with her PCP with regards to her symptoms of increased weight, swelling, increased acne and not sleeping at night.  PCP felt is was due to the prednisone and recommended dosage decrease.  Message to Dr. Landis Martins.

## 2017-06-21 NOTE — Unmapped (Signed)
I spoke with Tracy Watkins and she said she would go and have her labs done today. I faxed the labs over to [phone].

## 2017-06-29 NOTE — Unmapped (Signed)
Message received from patient's husband, Earna Coder.  States patient has gained 30 pounds with the prednisone.  Spoke with local PCP and believes it is related to the Prednisone.  Discussed with Dr. Landis Martins.  After review of labs, okay to discontinue prednisone.  Return call to [place] and updated on plan.

## 2017-07-01 NOTE — Unmapped (Signed)
I spoke with Tracy Watkins and advised her that she was scheduled for [place] at 10:45 and ltra with [place] at noon. Pt is aware of prep for testing as well.

## 2017-07-08 NOTE — Unmapped (Signed)
Faxed orders over to [facility].

## 2017-07-18 ENCOUNTER — Ambulatory Visit: Admit: 2017-07-18 | Payer: TRICARE (CHAMPUS)

## 2017-07-18 ENCOUNTER — Inpatient Hospital Stay: Admit: 2017-07-18 | Payer: TRICARE (CHAMPUS)

## 2017-07-18 DIAGNOSIS — K7030 Alcoholic cirrhosis of liver without ascites: Secondary | ICD-10-CM

## 2017-07-18 DIAGNOSIS — K7460 Unspecified cirrhosis of liver: Secondary | ICD-10-CM

## 2017-07-18 LAB — DIFFERENTIAL
Basophils Absolute: 5 /uL (ref 0–200)
Basophils Relative: 0.3 % (ref 0.0–1.0)
Eosinophils Absolute: 56 /uL (ref 15–500)
Eosinophils Relative: 3.5 % (ref 0.0–8.0)
Lymphocytes Absolute: 587 /uL (ref 850–3900)
Lymphocytes Relative: 36.7 % (ref 15.0–45.0)
Monocytes Absolute: 86 /uL (ref 200–950)
Monocytes Relative: 5.4 % (ref 0.0–12.0)
Neutrophils Absolute: 866 /uL (ref 1500–7800)
Neutrophils Relative: 54.1 % (ref 40.0–80.0)
nRBC: 1 /100 WBC (ref 0–0)

## 2017-07-18 LAB — RENAL FUNCTION PANEL W/EGFR
Albumin: 3.7 g/dL (ref 3.5–5.7)
Anion Gap: 8 mmol/L (ref 3–16)
BUN: 17 mg/dL (ref 7–25)
CO2: 27 mmol/L (ref 21–33)
Calcium: 8.7 mg/dL (ref 8.6–10.3)
Chloride: 104 mmol/L (ref 98–110)
Creatinine: 0.78 mg/dL (ref 0.60–1.30)
Glucose: 93 mg/dL (ref 70–100)
Osmolality, Calculated: 289 mOsm/kg (ref 278–305)
Phosphorus: 2.2 mg/dL (ref 2.1–4.7)
Potassium: 3.6 mmol/L (ref 3.5–5.3)
Sodium: 139 mmol/L (ref 133–146)
eGFR AA CKD-EPI: >90 See note.
eGFR NONAA CKD-EPI: >90 See note.

## 2017-07-18 LAB — PROTIME-INR
INR: 1.5 (ref 0.9–1.1)
Protime: 17.9 seconds (ref 11.8–14.8)

## 2017-07-18 LAB — CBC
Hematocrit: 33.4 % (ref 35.0–45.0)
Hemoglobin: 11.9 g/dL (ref 11.7–15.5)
MCH: 38.5 pg (ref 27.0–33.0)
MCHC: 35.5 g/dL (ref 32.0–36.0)
MCV: 108.3 fL (ref 80.0–100.0)
MPV: 7.9 fL (ref 7.5–11.5)
Platelets: 94 10*3/uL (ref 140–400)
RBC: 3.08 10*6/uL (ref 3.80–5.10)
RDW: 16 % (ref 11.0–15.0)
WBC: 1.6 10*3/uL (ref 3.8–10.8)

## 2017-07-18 LAB — HEPATIC FUNCTION PANEL, SERUM
ALT: 14 U/L (ref 7–52)
AST (SGOT): 25 U/L (ref 13–39)
Albumin: 3.6 g/dL (ref 3.5–5.7)
Alkaline Phosphatase: 82 U/L (ref 36–125)
Bilirubin, Direct: 0.64 mg/dL (ref 0.00–0.40)
Bilirubin, Indirect: 2.06 mg/dL (ref 0.00–1.10)
Total Bilirubin: 2.7 mg/dL (ref 0.0–1.5)
Total Protein: 6.6 g/dL (ref 6.4–8.9)

## 2017-07-18 LAB — TSH: TSH: 1.64 u[IU]/mL (ref 0.45–4.12)

## 2017-07-18 LAB — CORTISOL: Cortisol: 6.5 ug/dL

## 2017-07-18 NOTE — Unmapped (Signed)
-   Blood work today  - Continue Imuran 125 mg daily  - Low salt diet  - Avoid aspirin, Ibuprofen, Aleeve, Motrin since these medications can increase the risk of bleeding and can cause kidney damage.  - Can take Tylenol up to 2000 mg in a 24 hour period.  - Complete abstinence from alcohol.  - Transplant follow up in 6 months

## 2017-07-18 NOTE — Unmapped (Signed)
Review of Systems   Constitutional: Negative for chills.   HENT: Negative.    Eyes: Negative.    Respiratory: Negative for shortness of breath and wheezing.    Cardiovascular: Negative for leg swelling.   Gastrointestinal: Negative for abdominal pain.   Genitourinary: Negative.    Musculoskeletal: Negative for back pain and joint pain.   Skin: Negative.    Neurological: Negative for dizziness, weakness and headaches.   Endo/Heme/Allergies: Does not bruise/bleed easily.   Psychiatric/Behavioral: Negative.

## 2017-07-18 NOTE — Unmapped (Signed)
Transplant Hepatology Follow Up    HPI:                                                                                                                             This is a follow up visit for Tracy Watkins, 21 y.o. 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 The patient was diagnosed with AIH in June 2017 and was listed for liver transplant at [facility]) in [place]. 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 Her liver tests were elevated - AST-266, ALT-210, ALP-255, TB-2.8 with imaging suggestive of cirrhosis. Her serological evaluation was strongly suggestive of AIH - + ANA, ASMA-74, IgG-3200, AMA-negative. She underwent liver biopsy in June 2017 which revealed underlying cirrhosis with severe hepatitis - predominantly lymphoplasmacytic infiltrate with interface activity, hepatocyte dropout, incomplete granulmomas, and bile ductular proliferation. As a result, she was initiated on high dose prednisone 60 mg and eventually azathioprine 100 mg was added in late July 2017. Due to plateaued liver tests, she was transitioned to Cellcept 1 gram BID in August / Sept 2017 and tapered rapidly off of prednisone. Other than mild jaundice, she is not clinically decompensated without any GI bleeding, ascites or confusion. Her EGD in Sept 2017 had evidence of small varices. Her evaluation for transplant was unremarkable in [place]. She moved to [place], [place] in Jan 2018 due to husband's job in [employer].  She presents today for follow up. Her medications were recently changed from Cellcept to Imuran 125 mg daily since patient is trying to get pregnant. Prednisone was stopped since she was getting side effects from it even at low dose of 5 mg. Reports feeling better since prednisone stopped. Also reports cold intolerance and dizziness (room  spinning) at times.     ROS:  10 out of 14 point review of systems was done, see MA note.    Past Medical History:  Past Medical History:   Diagnosis Date   ??? Acne    ??? 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 Bilateral leg edema    ??? Cirrhosis (CMS Dx)    ??? Esophageal varices (CMS Dx) 05/2016    Grade 1/small, no history of bleeding   ??? PCOS (polycystic ovarian syndrome)      Allergies:  Allergies   Allergen Reactions   ??? Penicillins    ??? Latex Rash     Medications:  Current Outpatient Prescriptions   Medication Sig   ??? azaTHIOprine Take 2.5 tablets (125 mg total) by mouth daily.   ??? doxycycline Take 100 mg by mouth daily.             ??? ergocalciferol Take 1 capsule (50,000 Units total) by mouth once a week.   ??? hydroCHLOROthiazide Take 25 mg by mouth daily.   ???  spironolactone Take 50 mg by mouth daily.     Physical Examination:  Vitals:    07/18/17 0700   BP: 132/75   BP Location: Right arm   Patient Position: Sitting   Pulse: 97   Temp: 98.7 ??F (37.1 ??C)   TempSrc: Oral   SpO2: 100%   Weight: 207 lb (93.9 kg)   Height: 5' 6 (1.676 m)   General: Young female, NAD  HENT: Pupils b/l reactive to light, sclera anicteric, moist mucous membranes  Neck:Supple, no thyromegaly, no lymphadenopathy  Pulmonary: Good air entry b/l, lungs b/l clear to auscultation,   Cardiac:RRR, S1 and S2 normal  Abdomen:Soft, ND, NT, active bowel sounds, no hernia  Neuro: Awake, alert, oriented x 3, no asterixis  Extremities: Distal pulses 2+, No LE edema  Skin: No rash or jaundice    Labs:  Lab name 12/20/16  1434   HEMOGLOBIN 12.6   HEMATOCRIT 38.4   MEAN CORPUSCULAR VOLUME 91.1   PLATELETS 96*   SODIUM 135   POTASSIUM 4.0   CHLORIDE 105   CO2 25   BUN 17   CREATININE 0.70   GLUCOSE 78   PHOSPHORUS 2.9   ALBUMIN 3.3*   3.3*   CALCIUM 9.3   AST 65*   ALT 52   BILIRUBIN TOTAL 2.1*   ALK PHOS 147*   INR 1.5*     Assessment and Plan:  40 Y female with 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    - Autoimmune hepatitis: She was on  Imuran 125 mg daily, switched back from Cellcept since she wants to get pregnant. Prednisone stopped due to side effects. LFTs are relatively stable. If any significant rise in LFTs, Budesonide can be considered.   - Variceal screening/surveillance/prophylaxis: EGD for follow up of small varices will be due around next visit.   - HCC screening: Will recommend AFP and Ultrasound every 6 months  - Immunizations: Immune to Hep A and B  - Cold intolerance and dizziness: Will check TSH and Cortisol.  - Transplant status: Current MELD score is 11. Will keep her active on the list.   - Transplant follow up in 6 months    Tillman Abide, MD

## 2017-07-19 NOTE — Unmapped (Signed)
MELD lab values were successfully updated in UNet and were accurately updated with the most recent labs. The new MELD Score is 15.  Reviewed with patient and no questions at this time.

## 2017-08-09 NOTE — Unmapped (Signed)
[name] called and said that Tracy Watkins needed a refill on her Vitamin D sent to [name] . Sent to coordinator to advise?

## 2017-08-10 MED ORDER — ergocalciferol (ERGOCALCIFEROL) 50,000 unit capsule
1250 | ORAL_CAPSULE | ORAL | 3 refills | Status: AC
Start: 2017-08-10 — End: 2018-01-23

## 2017-08-15 NOTE — Unmapped (Signed)
Pt husband called about her needing a refill on her Vit D. He said she has been out since last week.

## 2017-08-15 NOTE — Unmapped (Signed)
I spoke with [name] and advised her that her Vitamin D rx had been sent in on 11/28 and was ready for pick up.

## 2017-09-28 MED ORDER — azaTHIOprineIMURAN50mgtablet
50 | ORAL_TABLET | Freq: Every day | ORAL | 4 refills | Status: AC
Start: 2017-09-28 — End: 2018-03-10

## 2017-10-11 NOTE — Unmapped (Signed)
Left message for [name] to call back about MELD labs. I also faxed the orders over to [facility].

## 2017-10-17 NOTE — Unmapped (Signed)
I spoke with [name] and she said she went last Thursday to have labs done. I called [name] and they said they will be faxing lab results over to [place].

## 2017-10-19 NOTE — Unmapped (Signed)
MELD lab values were successfully updated in UNet and were accurately updated with the most recent labs. The new MELD Score is 14.

## 2017-11-29 NOTE — Unmapped (Signed)
Patient called to update on new prescription, Bromocriptine 2.5 mg daily.  Medications updated.

## 2018-01-09 NOTE — Telephone Encounter (Signed)
I spoke with Tracy Watkins and advised her that she was due for MELD labs. I advised her that I needed her to go by Friday. She said she would.

## 2018-01-13 NOTE — Unmapped (Signed)
MELD lab values were successfully updated in UNet and were accurately updated with the most recent labs. The new MELD Score is 13.

## 2018-01-23 ENCOUNTER — Ambulatory Visit: Admit: 2018-01-23 | Payer: TRICARE (CHAMPUS)

## 2018-01-23 ENCOUNTER — Inpatient Hospital Stay: Admit: 2018-01-23 | Payer: TRICARE (CHAMPUS)

## 2018-01-23 DIAGNOSIS — K7030 Alcoholic cirrhosis of liver without ascites: Secondary | ICD-10-CM

## 2018-01-23 DIAGNOSIS — K7460 Unspecified cirrhosis of liver: Secondary | ICD-10-CM

## 2018-01-23 NOTE — Progress Notes (Signed)
Review of Systems   All other systems reviewed and are negative.

## 2018-01-23 NOTE — Unmapped (Signed)
Plan:  1. ??No MELD labs today.    2.  Immune to Hep A/B.   3. ??Would continue surveillance for HCC with AFP and ultrasound every 6 months  4. ??EGD at [facility] on annual basis.   5. Undergoing fertility treatment for pregnancy.   6.  Continue Azathioprine 125 mg daily.   7. ??Return in 6 months.

## 2018-01-23 NOTE — Progress Notes (Signed)
Subjective:       Tracy Watkins is a 22 y.o. 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 She is not clinically decompensated. She was last seen in Nov 2018.    The patient was diagnosed with AIH in June 2017 and was listed for liver transplant at [facility]) in [place]. 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 Her liver tests were elevated - AST-266, ALT-210, ALP-255, TB-2.8 with imaging suggestive of cirrhosis. Her serological evaluation was strongly suggestive of AIH - + ANA, ASMA-74, IgG-3200, AMA-negative. She underwent liver biopsy in June 2017 which revealed underlying cirrhosis with severe hepatitis - predominantly lymphoplasmacytic infiltrate with interface activity, hepatocyte dropout, incomplete granulmomas, and bile ductular proliferation. As a result, she was initiated on high dose prednisone 60 mg and eventually azathioprine 100 mg was added in late July 2017. Due to plateaued liver tests, she was transitioned to Cellcept 1 gram BID in August / Sept 2017 and tapered rapidly off of prednisone. Other than mild jaundice, she is not clinically decompensated without any GI bleeding, ascites or confusion. Her EGD in Sept 2017 had evidence of small varices. Her evaluation for transplant was unremarkable in [place]. She moved to [place], [place] in Jan 2018 due to husband's job in [employer]. She is immune to Hep A/B.     In the interim, she completed her evaluation and was listed for OLT in the past years. The patient was transitioned off Cellcept / prednisone due to attempting pregnancy. Currently, she takes azathioprine 125 mg. Her liver tests are AST-28, ALT-15, TB-2.3. She is on fertility treatment with her reproductive endocrinologist.     The following portions of  the patient's history were reviewed and updated as appropriate: allergies, current medications, past family history, past medical history, past social history, past surgical history and problem list.    Comprehensive review of systems performed.  See scanned review of systems form for details. An extensive review of past records including prior office notes, labs, imaging, and pathology reports was performed from the Media tab, Care Everywhere and paper documents.      Objective:    Physical Exam   Nursing note and vitals reviewed.  Constitutional: She is oriented to person, place, and time. She appears well-developed and well-nourished. No distress.   HENT:   Head: Normocephalic and atraumatic.   Mouth/Throat: Oropharynx is clear and moist. No oropharyngeal exudate.   Eyes: Pupils are equal, round, and reactive to light. Conjunctivae are normal. No scleral icterus.   Neck: Normal range of motion. Neck supple. No tracheal deviation present. No thyromegaly present.   Cardiovascular: Normal rate, regular rhythm and normal heart sounds.    Pulmonary/Chest: Effort normal and breath sounds normal. No respiratory distress. She has no wheezes.   Abdominal: Soft. Bowel sounds are normal. She exhibits no distension and no mass. There is no tenderness.   Musculoskeletal: Normal range of motion. She exhibits no edema.   Lymphadenopathy:     She has no cervical adenopathy.   Neurological: She is alert and oriented to person, place, and time. No cranial nerve deficit.   No asterixis   Skin: Skin is warm and dry. No erythema.   + spider angiomata   Psychiatric: She has a normal mood and affect. Her behavior is normal. Thought  content normal.     Labs: The patient's recent laboratory results were reviewed.      Assessment & Plan:       22 year old Caucasian female with cirrhosis due to AIH listed for OLT here for follow-up.     1) Cirrhosis due to AIH: The patient is listed for OLT with low MELD of 12. She is not clinically  decompensated and planning for pregnancy. We discussed she would have to  be placed on hold for transplant for 1 year after pregnancy. No MELD labs today.    Immune to Hep A/B.  Would continue surveillance for HCC with AFP and ultrasound every 6 months EGD at [facility] on annual basis.  Undergoing fertility treatment for pregnancy.   Continue Azathioprine 125 mg daily.  Return in 6 months.    Plan:  1. No MELD labs today.    2.  Immune to Hep A/B.   3. Would continue surveillance for HCC with AFP and ultrasound every 6 months  4. EGD at [facility] on annual basis.   5. Undergoing fertility treatment for pregnancy.   6.  Continue Azathioprine 125 mg daily.   7. Return in 6 months.

## 2018-01-24 NOTE — Progress Notes (Signed)
Recent imaging reviewed with Dr. Landis Martins.  No need for further imaging.

## 2018-02-20 NOTE — Telephone Encounter (Signed)
Message from patients husband with request for prescription for therapy pet for patient's anxiety/depression.  Discussed with SW and this is something that may need to be done by Dr. Diona Browner or Dr. Marisa Cyphers.  Call to patient and offered an appointment with Dr. Diona Browner and she will let me know.

## 2018-03-07 ENCOUNTER — Encounter

## 2018-03-07 NOTE — Unmapped (Signed)
UPDATE 03/07/2018:    SW spoke with patient via phone to complete the annual update to her psychosocial assessment.  Patient confirmed that her husband as well as his parents and her parents would work together to provide care for patient post-transplant.  Patient reported that she has a history of depression and anxiety (diagnosed around the age of 9); however, this was not mentioned during her initial assessment.  Patient reported that her anxiety is more bothersome than her depression, but reported that both are currently well controlled.  She does not take any psychotropic medications currently.  No narcotic pain medications prescribed.  No concerns or complaints this date.  There are no barriers to liver transplant from a psychosocial perspective.  Original assessment can be seen below.    Tracy Commons LISW, CCTSW  03/07/2018  [phone]      TRANSPLANT PSYCHOSOCIAL ASSESSMENT  ??  Support Persons:  Tracy Watkins, husband, [phone]  ??  Client's Perception of Medical Condition:   Patient is referred by Dr. Shawnie Watkins for liver transplant evaluation in the liver transplant clinic.  I met with her and her husband in our Multi D clinic to complete a liver transplant psychosocial assessment.  ??  Patient presented as knowledgeable regarding her medical condition and treatment option.  Patient reported adherence to her current medical treatment and medications.  She reported zero missed doses of medications in the past 4 weeks.  Patient reported that she was seeing a dermatologist and placed on medications for severe acne.  When the dermatologist ran lab work in October 2016, patient was noted to have abnormally high liver enzymes.  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  MELD 16.   ??  Patient endorsed symptoms that include fatigue and difficulty sleeping.  She reported that she has had slight abdominal and leg swelling in the past, but  that this resolved with medications.  Patient reported feeling overwhelmed upon her diagnosis.  She reported that she was shocked at first, but has come to terms with it now.  She is not acquainted with anyone who has received a liver transplant.  She does not have any concerns regarding liver transplant.  She would like to proceed with work up.  Patient is currently listed at [facility], but is inactive on the wait list.  Patient's husband was transferred to [place] for employment.  Patient reported that she would like to be listed here and once she is, [facility] will remove her from their caseload.  Patient does not have any religious, ethnic, or personal objections to accepting blood products, having surgery, or receiving a transplant.  Patient states that she understands the process of liver transplant, and she is choosing liver transplant as her treatment option.  ??  PCP:  Dr. Victorino Dike Watkins  Pharmacy:  [name]  ??  Support Plan:   Patient reported that her and her husband's families would come to town to provide care post-transplant.  Patient works full-time for the [employer] and is in the [employer].  He has limited time off available, but would apply for any time off that he was allowed to.  She has a written handout describing what type of support she will need post transplant.  ??  Advance Directive packet provided to patient.  She is aware that this is needed prior to listing.    ??  Psychiatric History:  Patient denies a history of diagnosis or treatment for psychiatric disorders.    ??  Reviewed results of questionnaires with patient. (PHQ-9 score: 1, GAD-7 score: 0)  No concerns at this time regarding depression or anxiety.  Patient reported that when she is faced with a difficult time, she calls her mom to talk it through.  Patient did report that the move to [place] has been an adjustment.  This worker will continue to monitor for signs and  symptoms of depression throughout pre-transplant process.   ??  Tobacco/Alcohol/Other Drug:  She denies a history of alcohol or drug addiction.    ??  Narcotic Pain Medication:  Patient is not currently taking any narcotic medication.  ??  Past and Current Life / Social Situation:   Patient is a 22 year old married Caucasian female who resides with her husband.  She has a high school education. She currently does not work.  She was never in [employer].  Her hobbies include painting, reading, crocheting, and playing with her dogs.  ??  Patient does not have income of her own.  They live off her husband's income.  Patient is insured through [employer].  ??  Family History:   Patient and her husband have been married for 3 years.  They do not have children.  Patient was born in [place] and raised by her biological parents.  Both are living and in good health.  She has 3 brothers.  She does not have any communication with 2 of her brothers.  The brother with whom she does communicate, lives with her mom and has autism and is hearing impaired.  ??  Rehabilitation Plans:   Patient has dreams of returning to school and earning a Bachelor's Degree.  ??  Evaluation/ImpressionRecommendations:  Patient is a 22 year old married Caucasian female with a diagnosis of cirrhosis due to autoimmune hepatitis who presented this date for her initial evaluation for liver transplant consideration.  Patient has identified her husband and well as her parents and in-laws as her caregivers post-transplant.  Patient does not have any psychiatric diagnoses.  She does not have any history of alcohol or substance abuse.  ??  Patient is independent in self care.  She does not use any medical equipment nor does she have any home health services.  She is able to complete household tasks.  She has a valid driver's license, but prefers to let her husband drive.    ??  Patient is of child bearing years and this worker inquired about future plans for  having a family.  Patient and her husband both report that they very much look forward to having a family.  This worker informed patient that there would need to be open discussion with the transplant team regarding family planning as post-transplant medications may need to be changed at that point.  Patient verbalized understanding.  This worker e-mailed team regarding patient needing education/information regarding pregnancy and post-liver transplant medication regimen concerns or contraindications.  Dr. Landis Martins to address this with patient.    ??  SW discussed psychosocial risks from the transplant, such as PTSD, generalized anxiety disorder, and anxiety about being dependent on others. I also explained that she may have guilt feelings. We discussed the potential psychiatric side effects of medications that she will be on during the post transplant phase. I explained that these medications especially prednisone can cause mood swings and anxiety and gave the patient a chance to ask questions.   ??  She has no psychosocial barriers at this time.  ??  She has clear instructions of what she will need in terms of support and transportation post transplant.  She is aware of the plan, and she is agreeable with the plan.   ??  SW will follow as needed in the transplant clinic. She has my contact name and phone number for additional needs.  ??  Note routed to transplant nurse coordinator, Haskel Schroeder.  ??  Tracy Commons LISW, [place]  12/20/2016  [phone]

## 2018-03-07 NOTE — Unmapped (Addendum)
This wait-listed patient was reviewed in the Liver Transplant Multi-disciplinary meeting today and was found to have no contraindications for OLT pending financial and pharmacy review.    All reviews completed as of 03/09/2018 and patient was found to have no contraindications for OLT.

## 2018-03-07 NOTE — Progress Notes (Signed)
Transplant Pharmacist Evaluation    Current Outpatient Prescriptions   Medication Sig    azaTHIOprine (IMURAN) 50 mg tablet Take 2.5 tablets (125 mg total) by mouth daily.    bromocriptine (PARLODEL) 2.5 mg tablet Take 2.5 mg by mouth daily.    calcium-vitamin D (CALCIUM-VITAMIN D) 500 mg(1,250mg ) -200 unit per tablet Take 1 tablet by mouth daily.    hydroCHLOROthiazide (HYDRODIURIL) 25 MG tablet Take 25 mg by mouth daily.    letrozole (FEMARA) 2.5 mg tablet Take 2.5 mg by mouth daily. 2.5 mg (2 tabs x 5 days a month)    prenatal vit,cal 74/iron/folic (PRENATAL VITAMIN 1+1 ORAL) Take by mouth.     No current facility-administered medications for this visit.        This patient is currently listed for liver transplant in [facility].  Their case was reviewed today in the patient care meeting as part of annual waitlist maintenance.  I was present for her presentation and discussion and have reviewed their medications (OARRS report reviewed).   On prednisone and azathioprine for autoimmune hepatitis.  Per chart review mentioned about changing to mycophenolate mofetil but she is considering fertility treatments so will continue on azathioprine.  No pharmacologic contraindications to transplant were identified.

## 2018-03-08 NOTE — Unmapped (Signed)
[place] [id number]   Called [facility] [phone] voice response policy is active   Max oop is $1000   Pt is cleared to have yearly testing

## 2018-03-10 MED ORDER — azaTHIOprine (IMURAN) 50 mg tablet
50 | ORAL_TABLET | Freq: Every day | ORAL | 4 refills | Status: AC
Start: 2018-03-10 — End: 2018-08-28

## 2018-03-13 NOTE — Telephone Encounter (Signed)
Follow up call to patient.  With with cold symptoms of cough and sore throat; no fever.  Advised on what she may take over the counter.  Advised to avoid ibuprofen containing products.

## 2018-03-13 NOTE — Telephone Encounter (Signed)
Tracy Watkins called requesting to know what she was able to take for a sore throat and cough. Will route message to transplant coordinator Mikal Plane to advise

## 2018-04-10 NOTE — Telephone Encounter (Signed)
I spoke with Tracy Watkins and advised her that she was due for MELD labs. She said she would go Wednesday or Thursday to have drawn. Order were faxed over.

## 2018-04-13 NOTE — Telephone Encounter (Signed)
Left message for lab results to be faxed.

## 2018-04-14 NOTE — Unmapped (Signed)
MELD lab values were successfully updated in [facility] and were accurately updated with the most recent labs. The new MELD Score is 10.

## 2018-07-03 NOTE — Telephone Encounter (Signed)
Spoke with patient.  Informed we do not typically provide patients with handicapped placards.  Referred to Primary Care Physician.

## 2018-07-03 NOTE — Telephone Encounter (Signed)
Pt called to see when she is due for her next lab draw. Told patient not until next august 2020. Also asked about her handicap parking sticker, stated we do not do those here, but wanted to talk to coordinator so call was transferred to [facility].

## 2018-07-04 NOTE — Telephone Encounter (Signed)
I spoke with Tracy Watkins and advised her of her appointments on 11/04. I gave her the prep and also mailed the information to her as well.

## 2018-07-17 ENCOUNTER — Ambulatory Visit: Admit: 2018-07-17 | Discharge: 2018-07-17 | Payer: TRICARE (CHAMPUS)

## 2018-07-17 ENCOUNTER — Inpatient Hospital Stay: Admit: 2018-07-17 | Discharge: 2018-07-27 | Payer: TRICARE (CHAMPUS)

## 2018-07-17 ENCOUNTER — Ambulatory Visit: Admit: 2018-07-17 | Payer: TRICARE (CHAMPUS)

## 2018-07-17 DIAGNOSIS — Z01818 Encounter for other preprocedural examination: Secondary | ICD-10-CM

## 2018-07-17 DIAGNOSIS — K754 Autoimmune hepatitis: Secondary | ICD-10-CM

## 2018-07-17 LAB — CBC
Hematocrit: 33 % (ref 35.0–45.0)
Hemoglobin: 11.2 g/dL (ref 11.7–15.5)
MCH: 36.9 pg (ref 27.0–33.0)
MCHC: 33.9 g/dL (ref 32.0–36.0)
MCV: 108.7 fL (ref 80.0–100.0)
MPV: 7.5 fL (ref 7.5–11.5)
Platelets: 95 10*3/uL (ref 140–400)
RBC: 3.04 10*6/uL (ref 3.80–5.10)
RDW: 17.2 % (ref 11.0–15.0)
WBC: 1.4 10*3/uL (ref 3.8–10.8)

## 2018-07-17 LAB — DIFFERENTIAL
Basophils Absolute: 8 /uL (ref 0–200)
Basophils Relative: 0.6 % (ref 0.0–1.0)
Eosinophils Absolute: 102 /uL (ref 15–500)
Eosinophils Relative: 7.3 % (ref 0.0–8.0)
Lymphocytes Absolute: 424 /uL (ref 850–3900)
Lymphocytes Relative: 30.3 % (ref 15.0–45.0)
Monocytes Absolute: 91 /uL (ref 200–950)
Monocytes Relative: 6.5 % (ref 0.0–12.0)
Neutrophils Absolute: 774 /uL (ref 1500–7800)
Neutrophils Relative: 55.3 % (ref 40.0–80.0)
nRBC: 0 /100 WBC (ref 0–0)

## 2018-07-17 LAB — HEPATIC FUNCTION PANEL, SERUM
ALT: 12 U/L (ref 7–52)
AST (SGOT): 22 U/L (ref 13–39)
Albumin: 3.3 g/dL (ref 3.5–5.7)
Alkaline Phosphatase: 47 U/L (ref 36–125)
Bilirubin, Direct: 0.39 mg/dL (ref 0.00–0.40)
Bilirubin, Indirect: 1.01 mg/dL (ref 0.00–1.10)
Total Bilirubin: 1.4 mg/dL (ref 0.0–1.5)
Total Protein: 6.8 g/dL (ref 6.4–8.9)

## 2018-07-17 LAB — RENAL FUNCTION PANEL W/EGFR
Albumin: 3.3 g/dL — ABNORMAL LOW (ref 3.5–5.7)
Anion Gap: 3 mmol/L (ref 3–16)
BUN: 15 mg/dL (ref 7–25)
CO2: 31 mmol/L (ref 21–33)
Calcium: 8.7 mg/dL (ref 8.6–10.3)
Chloride: 104 mmol/L (ref 98–110)
Creatinine: 0.62 mg/dL (ref 0.60–1.30)
Glucose: 84 mg/dL (ref 70–100)
Osmolality, Calculated: 286 mosm/kg (ref 278–305)
Phosphorus: 1.9 mg/dL — ABNORMAL LOW (ref 2.1–4.7)
Potassium: 3.4 mmol/L — ABNORMAL LOW (ref 3.5–5.3)
Sodium: 138 mmol/L (ref 133–146)
eGFR AA CKD-EPI: >90 See note.
eGFR NONAA CKD-EPI: >90 See note.

## 2018-07-17 LAB — PROTIME-INR
INR: 1.2 (ref 0.9–1.1)
Protime: 15.9 seconds (ref 12.1–15.1)

## 2018-07-17 LAB — AFP TUMOR MARKER: AFP-Tumor Marker: 2 ng/mL (ref 0.0–9.0)

## 2018-07-17 NOTE — Unmapped (Signed)
-   Continue Imuran 125 mg daily  - Blood work today  - Please schedule upper endoscopy at [facility] for vairceal screening  - Will recommend flu shot every season  - Avoid aspirin, Ibuprofen, Aleeve, Motrin since these medications can increase the risk of bleeding and can cause kidney damage.  - Can take Tylenol up to 2000 mg in a 24 hour period.  - Complete abstinence from alcohol.  - Transplant follow up in 6 months at [facility]

## 2018-07-17 NOTE — Unmapped (Signed)
Transplant Hepatology Follow Up    HPI:                                                                                                                             This is a follow up visit for Tracy Watkins, 22 y.o. 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 The patient was diagnosed with AIH in June 2017 and was listed for liver transplant at [facility]) in [place]. 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 Her liver tests were elevated - AST-266, ALT-210, ALP-255, TB-2.8 with imaging suggestive of cirrhosis. Her serological evaluation was strongly suggestive of AIH - + ANA, ASMA-74, IgG-3200, AMA-negative. She underwent liver biopsy in June 2017 which revealed underlying cirrhosis with severe hepatitis - predominantly lymphoplasmacytic infiltrate with interface activity, hepatocyte dropout, incomplete granulmomas, and bile ductular proliferation. As a result, she was initiated on high dose prednisone 60 mg and eventually azathioprine 100 mg was added in late July 2017. Due to plateaued liver tests, she was transitioned to Cellcept 1 gram BID in August / Sept 2017 and tapered rapidly off of prednisone. The switched to Imuran 125 mg daily. Other than mild jaundice, she is not clinically decompensated without any GI bleeding, ascites or confusion. Her EGD in Sept 2017 had evidence of small varices. Her evaluation for transplant was unremarkable in [place]. She moved to [place], [place] in Jan 2018 due to husband's job in [employer].  She presents today for follow up. Reports feeling well. No new symptoms or acute issues. Tolerating her Imuran well. On infertility treatment. Had Ultrasound of liver this AM, report pending. Functional status is good.     ROS:  10 out of 14 point review of systems was done, see MA note.    Past Medical  History:  Past Medical History:   Diagnosis Date   ??? Acne    ??? 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 Bilateral leg edema    ??? Cirrhosis (CMS Dx)    ??? Esophageal varices (CMS Dx) 05/2016    Grade 1/small, no history of bleeding   ??? PCOS (polycystic ovarian syndrome)      Allergies:  Allergies   Allergen Reactions   ??? Penicillins    ??? Latex Rash     Medications:  Current Outpatient Medications   Medication Sig   ??? azaTHIOprine Take 2.5 tablets (125 mg total) by mouth daily.   ??? bromocriptine Take 2.5 mg by mouth daily.   ??? calcium-vitamin D Take 1 tablet by mouth daily.   ??? hydroCHLOROthiazide Take 25 mg by mouth daily.   ??? letrozole Take 2.5 mg by mouth daily. 2.5 mg (2 tabs x 5 days a month)   ??? prenatal vit,cal 74/iron/folic (PRENATAL VITAMIN 1+1 ORAL) Take by mouth.  Physical Examination:  Vitals:    07/17/18 1156   BP: 121/69   BP Location: Right arm   Patient Position: Sitting   BP Cuff Size: Regular   Pulse: 101   Temp: 98.4 ??F (36.9 ??C)   TempSrc: Oral   SpO2: 100%   Weight: 209 lb (94.8 kg)   Height: 5' 6 (1.676 m)   General: Young female, NAD  HENT: Pupils b/l reactive to light, sclera anicteric, moist mucous membranes  Neck:Supple, no thyromegaly, no lymphadenopathy  Pulmonary: Good air entry b/l, lungs b/l clear to auscultation,   Cardiac:RRR, S1 and S2 normal  Abdomen:Soft, ND, NT, active bowel sounds, no hernia  Neuro: Awake, alert, oriented x 3, no asterixis  Extremities: Distal pulses 2+, No LE edema  Skin: No rash or jaundice    Labs:  Lab name 12/20/16  1434   HEMOGLOBIN 12.6   HEMATOCRIT 38.4   MEAN CORPUSCULAR VOLUME 91.1   PLATELETS 96*   SODIUM 135   POTASSIUM 4.0   CHLORIDE 105   CO2 25   BUN 17   CREATININE 0.70   GLUCOSE 78   PHOSPHORUS 2.9   ALBUMIN 3.3*   3.3*   CALCIUM 9.3   AST 65*   ALT 52   BILIRUBIN TOTAL 2.1*   ALK PHOS 147*   INR 1.5*     Assessment and Plan:  75 Y female with 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    - Autoimmune hepatitis: Will continue  Imuran  125 mg daily. Good control of liver enzymes.  - Variceal screening/surveillance/prophylaxis: EGD for follow up of small varices is due. Advised her to have it scheduled at [facility].   - [facility] screening: Will recommend AFP and Ultrasound every 6 months  - Immunizations: Immune to Hep A and B  - Transplant status: Current MELD score is 10. Will keep her active on the list.   - Transplant follow up in 6 months at [facility].     Tillman Abide, MD

## 2018-08-28 MED ORDER — azaTHIOprine (IMURAN) 50 mg tablet
50 | ORAL_TABLET | Freq: Every day | ORAL | 4 refills | Status: AC
Start: 2018-08-28 — End: 2019-02-19

## 2018-09-11 NOTE — Telephone Encounter (Signed)
Patient in-house at [facility]. Somewhat neutropenic. Admitted for pleuritic chest pain, no etiology. Asking about holding Imuran for a day or 2. Asking for further recommendations. Patient probably being discharged today. Otherwise doing ok.

## 2018-09-11 NOTE — Telephone Encounter (Signed)
OK with this plan. Her ANC runs between 500 and 1000. Will not hold Imuran long term as long as ANC is above 500

## 2018-09-11 NOTE — Telephone Encounter (Signed)
Dr. Tommi Rumps Masters [phone]) called from [facility] re: holding Imuran for an ANC of 583.  Plan was to hold for two days and re-check cbc w/diff.  Sees PCP on 1/6.  Patient was admitted for pleuritic type pain (PE and ACS ruled out) - believed to be musculo-skeletal.  Discharged today.

## 2018-09-12 NOTE — Telephone Encounter (Signed)
Follow-up call to Dr. Simonne Maffucci [facility].  They will repeat her CBC/diff on 1/2 and resume Imuran if ANC >500.

## 2018-09-20 NOTE — Unmapped (Signed)
I spoke with Tracy Watkins at [facility] and she said she would fax over the information to [place]. She advised that she had been out sick and that is why she had not sent [place] the information about [name].

## 2018-12-12 NOTE — Unmapped (Signed)
Call from patient's husband.  Patient with severe abdominal pain.  No fevers, nausea or vomiting.  Instructed to go to the ED for evaluation.  [facility]

## 2018-12-20 NOTE — Unmapped (Signed)
Left message for return call.

## 2018-12-20 NOTE — Unmapped (Signed)
Tracy Watkins called and rescheduled her for 05/27 at 11:45 for video visit. I also set up her my chart account. I also emailed her instructions on downloading the app to her iphone.

## 2019-01-10 NOTE — Unmapped (Signed)
Left message for return call.

## 2019-01-10 NOTE — Unmapped (Signed)
I spoke with Tracy Watkins and advised her that she was due for testing. I advised her that [place] was scheduled at [facility] on 05/26 at 10:45 at [facility].

## 2019-01-17 ENCOUNTER — Encounter

## 2019-02-06 ENCOUNTER — Inpatient Hospital Stay: Admit: 2019-02-06 | Discharge: 2019-04-05 | Payer: TRICARE (CHAMPUS)

## 2019-02-06 DIAGNOSIS — K7460 Unspecified cirrhosis of liver: Secondary | ICD-10-CM

## 2019-02-07 ENCOUNTER — Ambulatory Visit: Admit: 2019-02-07 | Discharge: 2019-02-07 | Payer: TRICARE (CHAMPUS)

## 2019-02-07 DIAGNOSIS — K754 Autoimmune hepatitis: Secondary | ICD-10-CM

## 2019-02-07 NOTE — Unmapped (Signed)
Transplant Hepatology Follow Up (video Visit)    HPI:                                                                                                                             This is a follow up visit for Tracy Watkins, 23 y.o. 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 The patient was diagnosed with AIH in June 2017 and was listed for liver transplant at [facility]) in [place]. 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 Her liver tests were elevated - AST-266, ALT-210, ALP-255, TB-2.8 with imaging suggestive of cirrhosis. Her serological evaluation was strongly suggestive of AIH - + ANA, ASMA-74, IgG-3200, AMA-negative. She underwent liver biopsy in June 2017 which revealed underlying cirrhosis with severe hepatitis - predominantly lymphoplasmacytic infiltrate with interface activity, hepatocyte dropout, incomplete granulmomas, and bile ductular proliferation. As a result, she was initiated on high dose prednisone 60 mg and eventually azathioprine 100 mg was added in late July 2017. Due to plateaued liver tests, she was transitioned to Cellcept 1 gram BID in August / Sept 2017 and tapered rapidly off of prednisone. The switched to Imuran 125 mg daily. Other than mild jaundice, she is not clinically decompensated without any GI bleeding, ascites or confusion. Her EGD in Sept 2017 had evidence of small varices. Her evaluation for transplant was unremarkable in [place]. She moved to [place], [place] in Jan 2018 due to husband's job in [employer].  Today she reports feeling well. She had a hospital admission for abdominal pain that lasted all day. No clear etiology could be found. On Imuran 125 mg daily. No new symptoms or acute issues. Tolerating her Imuran well. On infertility treatment. Had Ultrasound of liver yesterday, no  significant abnormality. Functional status is good. Had EGD in Dec 2019.    ROS:  A comprehensive review of systems was done and was negative other than HPI. Pertinent negative include abdominal pain, nausea, vomiting, fevers, chills, chest pain, shortness of breath, cough, jaundice, rash, anxiety, blurred vision, arthritis, heat or cold intolerance, headaches, dizziness, suicidal thoughts, abnormal bruising or bleeding.      Past Medical History:  Past Medical History:   Diagnosis Date   ??? Acne    ??? 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 Bilateral leg edema    ??? Cirrhosis (CMS Dx)    ??? Esophageal varices (CMS Dx) 05/2016    Grade 1/small, no history of bleeding   ??? PCOS (polycystic ovarian syndrome)      Allergies:  Allergies   Allergen Reactions   ??? Penicillins    ??? Latex Rash     Medications:  Current Outpatient Medications   Medication Sig   ??? azaTHIOprine Take 2.5 tablets (125 mg total) by mouth daily.   ??? bromocriptine Take 2.5  mg by mouth daily.   ??? calcium-vitamin D Take 1 tablet by mouth daily.   ??? hydroCHLOROthiazide Take 25 mg by mouth daily.   ??? letrozole Take 2.5 mg by mouth daily. 2.5 mg (2 tabs x 5 days a month)   ??? prenatal vit,cal 74/iron/folic (PRENATAL VITAMIN 1+1 ORAL) Take by mouth.     No current facility-administered medications for this visit.      Physical Examination:  General: Young female, NAD  Neck: Supple  Pulmonary: No respiratory distress or use of accessory muscles   Abdomen: Non-distended, no surgical scars or hernias  Neuro: Awake, alert, oriented x 3, no asterixis  Extremities: No LE edema  Skin: No rash or jaundice    Labs:  Lab name 12/20/16  1434   HEMOGLOBIN 12.6   HEMATOCRIT 38.4   MEAN CORPUSCULAR VOLUME 91.1   PLATELETS 96*   SODIUM 135   POTASSIUM 4.0   CHLORIDE 105   CO2 25   BUN 17   CREATININE 0.70   GLUCOSE 78   PHOSPHORUS 2.9   ALBUMIN 3.3*   3.3*   CALCIUM 9.3   AST 65*   ALT 52   BILIRUBIN TOTAL 2.1*   ALK PHOS 147*   INR 1.5*     Assessment and Plan:  81 Y  female with 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    - Autoimmune hepatitis: Will continue  Imuran 125 mg daily. Good control of liver enzymes.  - Variceal screening/surveillance/prophylaxis: Had EGD in Dec 2019 at [facility] which did not show any varices. Can repeat in 1-2 years.  - HCC screening: Ultrasound from yesterday did not show any lesions. Will repeat in 6 months  - Immunizations: Immune to Hep A and B  - Transplant status: Current MELD score is 10. Will keep her active on the list.   - Transplant follow up in 6 months at [facility].     This note was completely edited, written and reviewed by me and consists of information cut and pasted from the my most recent visit, my smart phrases and other Epic tools. I have personally reviewed all aspects of this note to at least include reviewing this patient's chart and problem list, updating the history, physical exam, lab and procedure results, and assessment and plan as detailed above and below.  As such this visit note reflects my current evaluation and management for this patient.    This was a video visit, including two-way audio and video communication, in lieu of an in-person visit. The patient verbally confirmed name and date of birth and provided verbal consent to use the video visit.    I spent 10 to 14 minutes  min on this call conducting an interview, speaking with the patient, performing a limited exam by video, and educating the patient on my assessment and plan.  I also spent 10 to 14 minutes  min performing other activities for the service provided including reviewing charts, interpreting any relevant films and discussing the case with the multidisciplinary team involved in the care.     Total time for the visit was 25 to 29 minutes  min.    Tillman Abide, MD

## 2019-02-09 NOTE — Unmapped (Signed)
Faxed orders to [facility]

## 2019-02-09 NOTE — Unmapped (Signed)
Left message for return call.

## 2019-02-13 NOTE — Unmapped (Signed)
MELD lab values were successfully updated in [facility] and were accurately updated with the most recent labs. The new MELD Score is 10.  Message left with patient reviewing MELD score and labs.

## 2019-02-13 NOTE — Unmapped (Signed)
Tracy Watkins tuh [phone]   [facility]   Pt is cleared to have yearly wait list testing   Cm janet [phone] called back auth letter for liver tx has no exp date  I called pt to make sure she has a referral in place before she has the yearly testing done said she would call

## 2019-02-19 MED ORDER — azaTHIOprine (IMURAN) 50 mg tablet
50 | ORAL_TABLET | Freq: Every day | ORAL | 4 refills | Status: AC
Start: 2019-02-19 — End: 2019-02-21

## 2019-02-21 MED ORDER — azaTHIOprine (IMURAN) 50 mg tablet
50 | ORAL_TABLET | Freq: Every day | ORAL | 4 refills | Status: AC
Start: 2019-02-21 — End: 2019-07-10

## 2019-03-13 NOTE — Unmapped (Signed)
Transplant Pharmacist Evaluation    Current Outpatient Medications   Medication Sig   ??? azaTHIOprine (IMURAN) 50 mg tablet Take 2.5 tablets (125 mg total) by mouth daily.   ??? bromocriptine (PARLODEL) 2.5 mg tablet Take 2.5 mg by mouth daily.   ??? calcium-vitamin D (CALCIUM-VITAMIN D) 500 mg(1,250mg ) -200 unit per tablet Take 1 tablet by mouth daily.   ??? hydroCHLOROthiazide (HYDRODIURIL) 25 MG tablet Take 25 mg by mouth daily.   ??? letrozole (FEMARA) 2.5 mg tablet Take 2.5 mg by mouth daily. 2.5 mg (2 tabs x 5 days a month)   ??? prenatal vit,cal 74/iron/folic (PRENATAL VITAMIN 1+1 ORAL) Take by mouth.     No current facility-administered medications for this visit.      This patient is currently listed for liver transplant in [facility].  Their case was reviewed today in the patient care meeting as part of annual waitlist maintenance.  I was present for her presentation and discussion and have reviewed their medications (OARRS report reviewed).   On azathioprine for autoimmune hepatitis and continues on fertility treatment (low MELD).  No pharmacologic contraindications to transplant were identified.

## 2019-03-16 NOTE — Unmapped (Signed)
ANNUAL WAIT LIST REVIEW 03/16/2019    SW spoke with patient via phone to complete the annual update to her psychosocial assessment.  Patient reported that she is doing well.  She confirmed that her parents and in-laws remain the primary support plan for post-transplant care.  Patient's husband will assist as able; however, he works full-time for [employer] and it is not known what his availability will be at that time.  Patient reported that her last known MELD was 10.  Discussion had regarding depression and anxiety.  Patient denied any symptoms and reported that she is currently not prescribed medication for either diagnosis.  Patient is not prescribed pain medications.  There are no barriers to liver transplant from a psychosocial perspective.  Original assessment and previous update below.    Tracy Watkins, CCTSW  03/16/2019  [phone]    UPDATE 03/07/2018:  ??  SW spoke with patient via phone to complete the annual update to her psychosocial assessment.  Patient confirmed that her husband as well as his parents and her parents would work together to provide care for patient post-transplant.  Patient reported that she has a history of depression and anxiety (diagnosed around the age of 23); however, this was not mentioned during her initial assessment.  Patient reported that her anxiety is more bothersome than her depression, but reported that both are currently well controlled.  She does not take any psychotropic medications currently.  No narcotic pain medications prescribed.  No concerns or complaints this date.  There are no barriers to liver transplant from a psychosocial perspective.  Original assessment can be seen below.  ??  [name] LISW, CCTSW  03/07/2018  [phone]  ??  ??  TRANSPLANT PSYCHOSOCIAL ASSESSMENT  ??  Support Persons:  Tracy Watkins, husband, [phone]  ??  Client's Perception of Medical Condition:   Patient is referred??by Dr. Shawnie Dapper for liver transplant evaluation in the  liver transplant clinic. ??I met with her??and her husband??in our Multi D clinic??to complete a liver transplant psychosocial assessment.  ??  Patient presented as knowledgeable regarding her medical condition and treatment option. ??Patient reported adherence to her current medical treatment and medications. ??She reported zero missed doses of medications in the past 4 weeks. ??Patient reported that she was seeing a dermatologist and placed on medications for severe acne. ??When the dermatologist ran lab work in October 2016, patient was noted to have abnormally high liver enzymes. 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 ??MELD 16.   ??  Patient endorsed symptoms that include fatigue and difficulty sleeping. ??She reported that she has had slight abdominal and leg swelling in the past, but that this resolved with medications. ??Patient reported feeling overwhelmed upon her diagnosis. ??She reported that she was shocked at first, but has come to terms with it now. ??She is not acquainted with anyone who has received a liver transplant. ??She does not have any concerns regarding liver transplant. ??She would like to proceed with work up. ??Patient is currently listed at [facility], but is inactive on the wait list. ??Patient's husband was transferred to [place] for employment. ??Patient reported that she would like to be listed here and once she is, [facility] will remove her from their caseload. ??Patient does not have any religious, ethnic, or personal objections to accepting blood products, having surgery, or receiving a transplant. ??Patient states that she understands the process of liver transplant,  and she is choosing liver transplant as her treatment option.  ??  PCP: ??Dr. Victorino Dike Beverage  Pharmacy: ??[name]  ??  Support Plan:   Patient??reported that her and her husband's families would come to town to provide care  post-transplant. ??Patient works full-time for the [employer] and is in the [employer]. ??He has limited time off available, but would apply for any time off that he was allowed to. ??She has a written handout describing what type of support she will need post transplant.  ??  Advance Directive packet provided to patient. ??She is aware that this is needed prior to listing. ??  ??  Psychiatric History:  Patient denies a history of diagnosis or treatment for psychiatric disorders.????  ??  Reviewed results of questionnaires with patient. (PHQ-9 score: 1, GAD-7 score: 0) ??No concerns at this time regarding depression or anxiety. ??Patient reported that when she is faced with a difficult time, she calls her mom to talk it through. ??Patient did report that the move to [place] has been an adjustment. ??This worker will continue to monitor for signs and symptoms of depression throughout pre-transplant process.   ??  Tobacco/Alcohol/Other Drug:  She denies a history of alcohol or drug addiction. ??  ??  Narcotic Pain Medication:  Patient is not currently taking any narcotic medication.  ??  Past and Current Life / Social Situation:   Patient is a 59??year old??married Caucasian female who resides with her husband. ??She has a high school education. She currently does not work. ??She was never in [employer]. ??Her??hobbies include??painting, reading, crocheting, and playing with her dogs.  ??  Patient does not have income of her own. ??They live off her husband's income. ??Patient is insured through [employer].  ??  Family History:   Patient and her husband have been married for 3 years. ??They do not have children. ??Patient was born in [place] and raised by her biological parents. ??Both are living and in good health. ??She has 3 brothers. ??She does not have any communication with 2 of her brothers. ??The brother with whom she does communicate, lives with her mom and has autism and is hearing impaired.  ??  Rehabilitation Plans:    Patient has dreams of returning to school and earning a Bachelor's Degree.  ??  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 ??Patient has identified her husband and well as her parents and in-laws as her caregivers post-transplant. ??Patient does not have any psychiatric diagnoses. ??She does not have any history of alcohol or substance abuse.  ??  Patient is independent in self care. ??She does not use any medical equipment nor does she have any home health services. ??She is able to complete household tasks. ??She has a valid driver's license, but prefers to let her husband drive. ??  ??  Patient is of child bearing years and this worker inquired about future plans for having a family. ??Patient and her husband both report that they very much look forward to having a family. ??This worker informed patient that there would need to be open discussion with the transplant team regarding family planning as post-transplant medications may need to be changed at that point. ??Patient verbalized understanding. ??This worker e-mailed team regarding patient needing education/information regarding pregnancy and post-liver transplant medication regimen concerns or  contraindications. ??Dr. Landis Martins to address this with patient. ??  ??  SW discussed psychosocial risks from the transplant, such as PTSD, generalized anxiety disorder, and anxiety about being dependent on others. I also explained that she may have guilt feelings. We discussed the potential psychiatric side effects of medications that she will be on during the post transplant phase. I explained that these medications especially prednisone can cause mood swings and anxiety and gave the patient a chance to ask questions.   ??  She has no psychosocial barriers at this time.  ??  She has clear  instructions of what she will need in terms of support and transportation post transplant. ??She is aware of the plan, and she is agreeable with the plan.   ??  SW will follow as needed in the transplant clinic. She has my contact name and phone number for additional needs.  ??  Note routed to transplant nurse coordinator, Haskel Schroeder.  ??  [name] LISW, [place]  12/20/2016  [phone]

## 2019-03-19 NOTE — Unmapped (Signed)
This wait-listed patient was reviewed in the Liver Transplant Multi-disciplinary meeting 03/13/2019 and was found to have no contraindications for OLT.

## 2019-06-01 NOTE — Telephone Encounter (Signed)
I spoke with Tracy Watkins and advised she was due for imaging and LTRA. I scheduled her to see [place] on 11/02 at 12 and [place] at 10:30. Note sent to coordinator.

## 2019-06-22 NOTE — Progress Notes (Signed)
Called patient and left message for her to call and reschedule appt. With Dr. Boykin Nearing.

## 2019-07-04 NOTE — Telephone Encounter (Signed)
Pt called needing to reschedule her LTRA & [place]. Rescheduled to 07/23/2019.

## 2019-07-09 ENCOUNTER — Other Ambulatory Visit: Admit: 2019-07-09 | Payer: TRICARE (CHAMPUS)

## 2019-07-09 ENCOUNTER — Ambulatory Visit: Admit: 2019-07-09 | Payer: TRICARE (CHAMPUS)

## 2019-07-09 DIAGNOSIS — K754 Autoimmune hepatitis: Secondary | ICD-10-CM

## 2019-07-09 LAB — DIFFERENTIAL
Basophils Absolute: 9 /uL (ref 0–200)
Basophils Relative: 0.5 % (ref 0.0–1.0)
Eosinophils Absolute: 77 /uL (ref 15–500)
Eosinophils Relative: 4.3 % (ref 0.0–8.0)
Lymphocytes Absolute: 360 /uL (ref 850–3900)
Lymphocytes Relative: 20 % (ref 15.0–45.0)
Monocytes Absolute: 128 /uL (ref 200–950)
Monocytes Relative: 7.1 % (ref 0.0–12.0)
Neutrophils Absolute: 1226 /uL (ref 1500–7800)
Neutrophils Relative: 68.1 % (ref 40.0–80.0)
nRBC: 0 /100 WBC (ref 0–0)

## 2019-07-09 LAB — CBC
Hematocrit: 39.5 % (ref 35.0–45.0)
Hemoglobin: 13.8 g/dL (ref 11.7–15.5)
MCH: 38 pg (ref 27.0–33.0)
MCHC: 34.9 g/dL (ref 32.0–36.0)
MCV: 108.9 fL (ref 80.0–100.0)
MPV: 7 fL (ref 7.5–11.5)
Platelets: 148 10*3/uL (ref 140–400)
RBC: 3.63 10*6/uL (ref 3.80–5.10)
RDW: 16.6 % (ref 11.0–15.0)
WBC: 1.8 10*3/uL (ref 3.8–10.8)

## 2019-07-09 LAB — RENAL FUNCTION PANEL W/EGFR
Albumin: 4.2 g/dL (ref 3.5–5.7)
Anion Gap: 10 mmol/L (ref 3–16)
BUN: 18 mg/dL (ref 7–25)
CO2: 28 mmol/L (ref 21–33)
Calcium: 9.7 mg/dL (ref 8.6–10.3)
Chloride: 101 mmol/L (ref 98–110)
Creatinine: 0.72 mg/dL (ref 0.60–1.30)
Glucose: 76 mg/dL (ref 70–100)
Osmolality, Calculated: 289 mOsm/kg (ref 278–305)
Phosphorus: 2.6 mg/dL (ref 2.1–4.7)
Potassium: 3.5 mmol/L (ref 3.5–5.3)
Sodium: 139 mmol/L (ref 133–146)
eGFR AA CKD-EPI: >90 See note.
eGFR NONAA CKD-EPI: >90 See note.

## 2019-07-09 LAB — PROTIME-INR
INR: 1.1 (ref 0.9–1.1)
Protime: 14.5 seconds (ref 12.1–15.1)

## 2019-07-09 LAB — HEPATIC FUNCTION PANEL, SERUM
ALT: 19 U/L (ref 7–52)
AST (SGOT): 32 U/L (ref 13–39)
Albumin: 4.1 g/dL (ref 3.5–5.7)
Alkaline Phosphatase: 42 U/L (ref 36–125)
Bilirubin, Direct: 0.81 mg/dL (ref 0.00–0.40)
Bilirubin, Indirect: 2.09 mg/dL (ref 0.00–1.10)
Total Bilirubin: 2.9 mg/dL (ref 0.0–1.5)
Total Protein: 7.8 g/dL (ref 6.4–8.9)

## 2019-07-09 LAB — AFP TUMOR MARKER: AFP-Tumor Marker: 2.1 ng/mL (ref 0.0–9.0)

## 2019-07-09 NOTE — Unmapped (Signed)
-   Continue Imuran 125 mg daily  - Blood work today  - Will recommend flu shot every season  - Avoid aspirin, Ibuprofen, Aleeve, Motrin since these medications can increase the risk of bleeding and can cause kidney damage.  - Can take Tylenol up to 2000 mg in a 24 hour period.  - Complete abstinence from alcohol.  - Transplant follow up in 6 months at [facility]

## 2019-07-09 NOTE — Telephone Encounter (Signed)
Pt called wanting to move her LTRA up since she has to leave to go out of town for a family emergency and wanted to get her questions sorted out with Dr. Boykin Nearing before she leaves. Scheduled for today at 1pm with Dr. Boykin Nearing.

## 2019-07-09 NOTE — Unmapped (Signed)
Transplant Hepatology Follow Up     HPI:                                                                                                                             This is a follow up visit for Tracy Watkins, 23 y.o. 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 The patient was diagnosed with AIH in June 2017 and was listed for liver transplant at [facility]) in [place]. 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 Her liver tests were elevated - AST-266, ALT-210, ALP-255, TB-2.8 with imaging suggestive of cirrhosis. Her serological evaluation was strongly suggestive of AIH - + ANA, ASMA-74, IgG-3200, AMA-negative. She underwent liver biopsy in June 2017 which revealed underlying cirrhosis with severe hepatitis - predominantly lymphoplasmacytic infiltrate with interface activity, hepatocyte dropout, incomplete granulmomas, and bile ductular proliferation. As a result, she was initiated on high dose prednisone 60 mg and eventually azathioprine 100 mg was added in late July 2017. Due to plateaued liver tests, she was transitioned to Cellcept 1 gram BID in August / Sept 2017 and tapered rapidly off of prednisone. The switched to Imuran 125 mg daily. Other than mild jaundice, she is not clinically decompensated without any GI bleeding, ascites or confusion. Her EGD in Sept 2017 had evidence of small varices. Her evaluation for transplant was unremarkable in [place]. She moved to [place], [place] in Jan 2018 due to husband's job in [employer].  She presents today for follow up. Reports doing well. On Imuran 125 mg daily with good control of LFTs. Tolerating her Imuran well. On infertility treatment. Functional status is good. Last EGD was in Dec 2019, no varices.     ROS:  A comprehensive review of systems was done and was negative other than  HPI. Pertinent negative include abdominal pain, nausea, vomiting, fevers, chills, chest pain, shortness of breath, cough, jaundice, rash, anxiety, blurred vision, arthritis, heat or cold intolerance, headaches, dizziness, suicidal thoughts, abnormal bruising or bleeding.      Past Medical History:  Past Medical History:   Diagnosis Date   ??? Acne    ??? 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 Bilateral leg edema    ??? Cirrhosis (CMS Dx)    ??? Esophageal varices (CMS Dx) 05/2016    Grade 1/small, no history of bleeding   ??? PCOS (polycystic ovarian syndrome)      Allergies:  Allergies   Allergen Reactions   ??? Penicillins    ??? Latex Rash     Medications:  Current Outpatient Medications   Medication Sig   ??? azaTHIOprine Take 2.5 tablets (125 mg total) by mouth daily.   ??? bromocriptine Take 2.5 mg by mouth daily.   ??? calcium-vitamin D Take 1 tablet by mouth daily.   ??? hydroCHLOROthiazide Take  25 mg by mouth daily.   ??? letrozole Take 2.5 mg by mouth daily. 2.5 mg (2 tabs x 5 days a month)   ??? prenatal vit,cal 74/iron/folic (PRENATAL VITAMIN 1+1 ORAL) Take by mouth.     No current facility-administered medications for this visit.      Physical Examination:  Vitals:    07/09/19 1310   BP: 140/82   BP Location: Left arm   Patient Position: Sitting   Pulse: 96   Resp: 18   Temp: 98.7 ??F (37.1 ??C)   TempSrc: Oral   SpO2: 100%   Weight: (!) 230 lb (104.3 kg)   Height: 5' 6 (1.676 m)   General: Young female, NAD  Neck: Supple, no lymphadenopathy  Head: PERRLA, moist oral mucosa  Pulmonary: Good air entry b/l, normal breath sounds   Abdomen: Soft, ND, NT, no surgical scars or hernias  Neuro: Awake, alert, oriented x 3, no asterixis  Extremities: No LE edema  Skin: No rash or jaundice    Labs:  Lab name 12/20/16  1434   HEMOGLOBIN 12.6   HEMATOCRIT 38.4   MEAN CORPUSCULAR VOLUME 91.1   PLATELETS 96*   SODIUM 135   POTASSIUM 4.0   CHLORIDE 105   CO2 25   BUN 17   CREATININE 0.70   GLUCOSE 78   PHOSPHORUS 2.9   ALBUMIN 3.3*   3.3*    CALCIUM 9.3   AST 65*   ALT 52   BILIRUBIN TOTAL 2.1*   ALK PHOS 147*   INR 1.5*     Assessment and Plan:  43 Y female with 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    - Autoimmune hepatitis: Will continue  Imuran 125 mg daily. Good control of liver enzymes. This can be continued even if she gets pregnant.   - Variceal screening/surveillance/prophylaxis: Had EGD in Dec 2019 at [facility] which did not show any varices. Can repeat in 1-2 years.  - HCC screening: Ultrasound is scheduled for tomorrow. Will follow up on results.   - Immunizations: Immune to Hep A and B  - Transplant status: Current MELD score is 10. Will keep her active on the list.   - Transplant follow up in 6 months at [facility]. Prescription for Disability placard for parking provided to patient today.     This note was completely edited, written and reviewed by me and consists of information cut and pasted from the my most recent visit, my smart phrases and other Epic tools. I have personally reviewed all aspects of this note to at least include reviewing this patient's chart and problem list, updating the history, physical exam, lab and procedure results, and assessment and plan as detailed above and below.  As such this visit note reflects my current evaluation and management for this patient.    Tillman Abide, MD

## 2019-07-10 ENCOUNTER — Inpatient Hospital Stay: Admit: 2019-07-10 | Payer: TRICARE (CHAMPUS)

## 2019-07-10 DIAGNOSIS — K7460 Unspecified cirrhosis of liver: Secondary | ICD-10-CM

## 2019-07-10 MED ORDER — azaTHIOprine (IMURAN) 50 mg tablet
50 | ORAL_TABLET | Freq: Every day | ORAL | 4 refills | Status: AC
Start: 2019-07-10 — End: 2020-02-05

## 2019-07-10 NOTE — Telephone Encounter (Signed)
Call to patient and updated on medication change.

## 2019-07-10 NOTE — Unmapped (Signed)
-----   Message from Tillman Abide, MD sent at 07/09/2019  2:31 PM EDT -----  Her LFTs have been pretty normal. Has leukopenia and macrocytosis due to Imuran. Will lower Imuran to 100 mg daily  ----- Message -----  From: Interface, Lab In [place]  Sent: 07/09/2019   2:02 PM EDT  To: Tillman Abide, MD

## 2019-07-10 NOTE — Progress Notes (Signed)
Imaging reviewed with Dr. Boykin Nearing.  Will repeat in six months.

## 2019-07-16 ENCOUNTER — Ambulatory Visit: Payer: TRICARE (CHAMPUS)

## 2019-07-23 ENCOUNTER — Ambulatory Visit: Payer: TRICARE (CHAMPUS)

## 2019-12-10 NOTE — Telephone Encounter (Signed)
Pt called to let us know she needs her labs faxed to [facility] and plans to go this week. Orders have been faxed.

## 2020-01-09 NOTE — Telephone Encounter (Signed)
Spoke with pt, scheduled for [place] & LTRA on 01/28/20.

## 2020-01-11 NOTE — Unmapped (Signed)
Tracy Watkins [facility] [phone]   [facility] pt ok to have wait list testing   Called pt asked if she has a referral put in for 2021 she said she has an appt in about 2 weeks and will make sure the referral is put in

## 2020-01-28 ENCOUNTER — Ambulatory Visit: Admit: 2020-01-28 | Payer: TRICARE (CHAMPUS)

## 2020-01-28 ENCOUNTER — Inpatient Hospital Stay: Admit: 2020-01-28 | Discharge: 2020-02-04 | Payer: TRICARE (CHAMPUS)

## 2020-01-28 ENCOUNTER — Ambulatory Visit: Admit: 2020-01-28 | Payer: TRICARE (CHAMPUS) | Attending: Gastroenterology

## 2020-01-28 DIAGNOSIS — K754 Autoimmune hepatitis: Secondary | ICD-10-CM

## 2020-01-28 DIAGNOSIS — K7460 Unspecified cirrhosis of liver: Secondary | ICD-10-CM

## 2020-01-28 LAB — HEPATIC FUNCTION PANEL
ALT: 18 U/L (ref 7–52)
AST: 26 U/L (ref 13–39)
Albumin: 3.8 g/dL (ref 3.5–5.7)
Alkaline Phosphatase: 55 U/L (ref 36–125)
Bilirubin, Direct: 0.37 mg/dL (ref 0.00–0.40)
Bilirubin, Indirect: 0.93 mg/dL (ref 0.00–1.10)
Total Bilirubin: 1.3 mg/dL (ref 0.0–1.5)
Total Protein: 7.2 g/dL (ref 6.4–8.9)

## 2020-01-28 LAB — CBC
Hematocrit: 38.1 % (ref 35.0–45.0)
Hemoglobin: 13.3 g/dL (ref 11.7–15.5)
MCH: 37.2 pg — ABNORMAL HIGH (ref 27.0–33.0)
MCHC: 34.9 g/dL (ref 32.0–36.0)
MCV: 106.5 fL — ABNORMAL HIGH (ref 80.0–100.0)
MPV: 8.5 fL (ref 7.5–11.5)
Platelets: 114 10E3/uL — ABNORMAL LOW (ref 140–400)
RBC: 3.58 10E6/uL — ABNORMAL LOW (ref 3.80–5.10)
RDW: 14.5 % (ref 11.0–15.0)
WBC: 2.3 10E3/uL — ABNORMAL LOW (ref 3.8–10.8)

## 2020-01-28 LAB — RENAL FUNCTION PANEL W/EGFR
Albumin: 3.8 g/dL (ref 3.5–5.7)
Anion Gap: 6 mmol/L (ref 3–16)
BUN: 18 mg/dL (ref 7–25)
CO2: 31 mmol/L (ref 21–33)
Calcium: 9.5 mg/dL (ref 8.6–10.3)
Chloride: 102 mmol/L (ref 98–110)
Creatinine: 0.74 mg/dL (ref 0.60–1.30)
Glucose: 118 mg/dL — ABNORMAL HIGH (ref 70–100)
Osmolality, Calculated: 291 mosm/kg (ref 278–305)
Phosphorus: 2.3 mg/dL (ref 2.1–4.7)
Potassium: 3.7 mmol/L (ref 3.5–5.3)
Sodium: 139 mmol/L (ref 133–146)
eGFR AA CKD-EPI: >90 See note.
eGFR NONAA CKD-EPI: >90 See note.

## 2020-01-28 LAB — AFP TUMOR MARKER: AFP-Tumor Marker: 1.9 ng/mL (ref 0.0–9.0)

## 2020-01-28 LAB — PROTIME-INR
INR: 1.2 — ABNORMAL HIGH (ref 0.9–1.1)
Protime: 15.1 s (ref 12.1–15.1)

## 2020-01-28 NOTE — Patient Instructions (Signed)
1. Labs as ordered today  2. Abdominal imaging to be scheduled  3. Continue current medications as prescribed.  4. Follow up in 6 months.

## 2020-01-28 NOTE — Unmapped (Signed)
I had the pleasure of seeing Ms. Tracy Watkins in the clinic today for a follow up. As you know, she is a 24 y.o. female with a history of AIH related liver disease. Currently, the patient has a MELD of 13. She has no decompensations from her liver disease. She is trying to conceive.    Review of Systems:   Comprehensive review of system was done and was negative.    Vitals:    01/28/20 1208   BP: 120/74   Pulse: 94   Temp: 98.8 ??F (37.1 ??C)     Body mass index is 40.51 kg/m??.    Constitutional: The pt is oriented to person, place, and time. Appears well-developed and well-nourished.   HENT:   Head: Normocephalic and atraumatic.   Mouth/Throat: Oropharynx is clear and moist. No oropharyngeal exudate.   Eyes: Pupils are equal, round, and reactive to light. No scleral icterus noted.   Neck: Normal range of motion.   Cardiovascular: Normal rate, regular rhythm and normal heart sounds.    Pulmonary/Chest: Effort normal and breath sounds normal.   Abdominal: Soft. Bowel sounds are normal. No organomegaly. No dullness in the flanks. No hernia. No surgical incisions.  Musculoskeletal: The patient exhibits no edema.   Lymphadenopathy:   Pt has no cervical adenopathy.   Neurological: The patient is alert and oriented to person, place, and time.   Skin: Skin is warm and dry. No stigmata of chronic liver disease.    Lab Results   Component Value Date    WBC 1.8 (L) 07/09/2019    RBC 3.63 (L) 07/09/2019    HCT 39.5 07/09/2019    MCV 108.9 (H) 07/09/2019    MCH 38.0 (H) 07/09/2019    MCHC 34.9 07/09/2019    RDW 16.6 (H) 07/09/2019    PLT 148 07/09/2019     Lab Results   Component Value Date    GLUCOSE 76 07/09/2019    BUN 18 07/09/2019    CO2 28 07/09/2019    CREATININE 0.72 07/09/2019    K 3.5 07/09/2019    NA 139 07/09/2019    CL 101 07/09/2019    CALCIUM 9.7 07/09/2019     Lab Results   Component Value Date    INR 1.1 07/09/2019     Lab Results   Component Value Date    AST 21 04/13/2018    ALT 19 07/09/2019     Results for  orders placed during the hospital encounter of 01/28/20    US ABDOMEN COMPLETE    Impression  IMPRESSION:    Findings compatible with cirrhosis. No suspicious focal lesions are identified.    Report Verified by: Fulton Reek, MD at 01/28/2020 11:33 AM EDT    Signed by: Fulton Reek, MD on 01/28/2020 11:33 AM      Assessment and Plan:  1. Compensated liver disease, will follow MELD score closely. I have asked pt to be seen in our non-transplant clinic with Dr. Boykin Nearing at [facility]. Pt is agreeable to this plan.   2. HCC surveillance: Last imaging was done today. Next one is due in November, 2021.  3. Esophageal variceal screening: Dr. Boykin Nearing can repeat her EGD for variceal screening.   4. Decompensations: none.  5. Follow up in 6 months time.

## 2020-01-29 NOTE — Unmapped (Signed)
Tracy Watkins was discussed at the Liver Transplant Selection Meeting on 01/29/2020 with a diagnosis of AIH and decision to remove patient from the [facility] due to condition improved; transplant not needed.

## 2020-02-05 MED ORDER — azaTHIOprine (IMURAN) 50 mg tablet
50 | ORAL_TABLET | Freq: Every day | ORAL | 6 refills | Status: AC
Start: 2020-02-05 — End: 2020-07-25

## 2020-03-25 NOTE — Progress Notes (Unsigned)
Received patient's records from [facility], placed in Dr. Aundria Rud bin at [facility] front desk.

## 2020-04-08 NOTE — Unmapped (Signed)
Emailed patient requested that she have [facility] fax her records and I will forward them to [facility] for her appointment.   Records are not found in Dr. Aundria Rud bin.

## 2020-04-09 NOTE — Unmapped (Signed)
Rec'd Records from [facility]    Labs 12/31/2019  Cadmium <0.5  Lead Not indicative of lead poisoning  Mercury <1.0  Arsenic 5    Non ART cycles  C#/Mo/Yr CRH/Other Regimen Gn   Y/N IUI   Y/N  Pregnancy Other info   OI#1-11/2017 [facility] LE 5mg   unmonitored N N N N   OI#4/IUI#16/2019 [facility] LE 7.5mg   N Y N IUI @ [facility]   OI#5  03/2018 Other LE 5mg   N N N    OI#6  04/2018 Other CC100 N N N 5 dominant, cycle canceled   OI#7  08/2019 [facility] LE 5mg  X 5 N N N 1 dom., TIC. MLP=10.6   OI#8  09/2019 [facility] LE 5mg    N N N Ovulated before follicle scan, cycle canceled   OI#9/IUI#2  11/2019 [facility] LE 5mg  X 5 N Y N 1 dom, 1 int (16,15), IUI 17.9 million TMC   OI#10/IUI#3  12/2019 [facility] CC50mg   Re-dose? N Y N 1 dom., IUI 24.5 million. P4=18.4   OI#11  03/2020 [facility] CC

## 2020-04-16 ENCOUNTER — Ambulatory Visit: Admit: 2020-04-16 | Discharge: 2020-04-16 | Payer: TRICARE (CHAMPUS)

## 2020-04-16 ENCOUNTER — Institutional Professional Consult (permissible substitution): Admit: 2020-04-16 | Discharge: 2020-04-16 | Payer: TRICARE (CHAMPUS)

## 2020-04-16 DIAGNOSIS — N912 Amenorrhea, unspecified: Secondary | ICD-10-CM

## 2020-04-16 LAB — COMPREHENSIVE METABOLIC PANEL
A/G Ratio: 1.1 (ref 1.2–2.2)
ALT: 13 IU/L (ref 0–32)
AST: 24 IU/L (ref 0–40)
Albumin: 3.9 g/dL (ref 3.9–5.0)
Alkaline Phosphatase: 75 IU/L (ref 48–121)
BUN/Creatinine Ratio: 17 (ref 9–23)
BUN: 13 mg/dL (ref 6–20)
CO2: 25 mmol/L (ref 20–29)
Calcium: 9.4 mg/dL (ref 8.7–10.2)
Chloride: 101 mmol/L (ref 96–106)
Creatinine: 0.75 mg/dL (ref 0.57–1.00)
GFR MDRD Af Amer: 128 mL/min/{1.73_m2} (ref >59)
Globulin, Total: 3.4 g/dL (ref 1.5–4.5)
Glucose: 87 mg/dL (ref 65–99)
Potassium: 3.5 mmol/L (ref 3.5–5.2)
Sodium: 138 mmol/L (ref 134–144)
Total Bilirubin: 1.5 mg/dL (ref 0.0–1.2)
Total Protein: 7.3 g/dL (ref 6.0–8.5)
eGFR Non-Afr. American: 111 mL/min/{1.73_m2} (ref >59)

## 2020-04-16 LAB — RPR: RPR: NONREACTIVE

## 2020-04-16 LAB — HEPATITIS B SURFACE ANTIGEN: Hep B Surface Ag: NEGATIVE

## 2020-04-16 LAB — PANEL 083935: HIV Screen 4th Generation wRfx: NONREACTIVE

## 2020-04-16 LAB — INHERITEST COMPREHENSIVE

## 2020-04-16 LAB — CBC
Hematocrit: 35.8 % (ref 34.0–46.6)
Hemoglobin: 12.3 g/dL (ref 11.1–15.9)
MCH: 36.2 pg (ref 26.6–33.0)
MCHC: 34.4 g/dL (ref 31.5–35.7)
MCV: 105 fL (ref 79–97)
Platelets: 121 10*3/uL (ref 150–450)
RBC: 3.4 x10E6/uL (ref 3.77–5.28)
RDW: 14.8 % (ref 11.7–15.4)
WBC: 2.6 10*3/uL (ref 3.4–10.8)

## 2020-04-16 LAB — HEPATITIS C AB W/REFLEX TO HCV RNA, QN, PCR: HCV Ab: 0.2 s/co ratio (ref 0.0–0.9)

## 2020-04-16 LAB — 17-HYDROXYPREGNENOLONE, MS: 17-Hydroxypregnenolone: 60 ng/dL

## 2020-04-16 LAB — INTERPRETATION: (HCV AB)

## 2020-04-16 LAB — LIPID PANEL
Cholesterol, Total: 143 mg/dL (ref 100–199)
HDL: 72 mg/dL (ref >39)
LDL Calculated: 59 mg/dL (ref 0–99)
Triglycerides: 55 mg/dL (ref 0–149)
VLDL Cholesterol Cal: 12 mg/dL (ref 5–40)

## 2020-04-16 LAB — RUBELLA IMMUNE STATUS: Rubella Antibodies, IgG: 5.74 index (ref >0.99)

## 2020-04-16 LAB — POCT - ESTRADIOL: POCT - Estradiol: 369.72

## 2020-04-16 LAB — HEMOGLOBIN A1C: Hemoglobin A1C: 4.5 % (ref 4.8–5.6)

## 2020-04-16 LAB — ANTIBODY SCREEN: Antibody Screen: NEGATIVE

## 2020-04-16 LAB — POCT - BETA HCG: POCT - Beta HCG: <2

## 2020-04-16 LAB — TSH: TSH: 2.52 u[IU]/mL (ref 0.450–4.500)

## 2020-04-16 LAB — TESTOSTERONE: Testosterone: 118 ng/dL (ref 13–71)

## 2020-04-16 LAB — POCT - FSH: POCT - FSH: 3.28

## 2020-04-16 LAB — ABO/RH: Rh Type: POSITIVE

## 2020-04-16 LAB — DHEA-SULFATE: DHEA Sulfate: 40.9 ug/dL (ref 84.8–378.0)

## 2020-04-16 LAB — ANTI-MULLERIAN HORMONE (AMH): Anti-Mullerian Hormone (AMH): 7.07 ng/mL

## 2020-04-16 LAB — POCT LUTEINIZING HORMONE (LH): LH, POC: 6.1

## 2020-04-16 LAB — VARICELLA ZOSTER ANTIBODY, IGG: Varicella IgG: <135 index (ref >165)

## 2020-04-16 LAB — POCT - PROGESTERONE: POCT - Progesterone: 12.3

## 2020-04-16 NOTE — Unmapped (Signed)
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 Patient with last total testosterone of 290, repeat down to 160. DHEAS remains normal.  Adrenal CT unremarkable.    As total testosterone declined from 290 to 160 and DHEAS + adrenal CT normal remains normal. No e/o ovarian masses on ultrasound but will consider MRI.  Ovarian morphology inconsistent with hyperthecosis.  Discussed findings with patient options including: ovarian vein sampling with possible drilling vs ovarian suppression with lupron X 3 months.  After discussing with husband couple desires depot lupron.  Discussed side effect profile and r/b/a of medication.  Recommend: Depo lupron 11.25mg  IM X 1  Repeat testosterone panel in 1 month (-march 1)  Consider ovarian MRI in future

## 2020-04-16 NOTE — Progress Notes (Signed)
Tracy Watkins is a 24 y.o. G 1 P 66 female presenting for evaluation and management/second opinion of PCOS and fertility concerns.     She has long history of PCOS/oligomenorrhea, can skip 4-5 months, up to a year with no menses.  Went on OCP's in high school to regulate menses, then had Nexplanon for 1.5 years and bled continuously then went back to OCP's until ready to conceive.    Menses vary with cycle interval, may be very heavy and long or shorter and lighter.  She does have dysmenorrhea with every cycle.     Records received from [facility], outlined below.    Pt hand carried records from [facility] with lab results from 2018/2019, see below.     She reports in between active treatment in 2019-2020, she was delayed by Covid shutdown and then was given Lupron Depot injection to reset her system.  Records reflect Lupron Depot 11.25mg  injection given 10/20/2018, pt stated menses did not resume for 6 months following this.    She then had laparoscopic ovarian drilling in 03/2019 (op note not provided) both sides but menses did not resume after the ovarian drilling; she also had no improvement in symptoms of hyperandrogenism at that time.    She relays that she is now interested in ovarian slicing procedure.  She states that Dr Tiburcio Pea told her to get a second opinion and see what needs to be done and then come back to her for treatment.    Pt also reports hx of autoimmune hepatitis, incidental finding by dermatologist when getting routine labs before starting medication.  She is under the care of GI and has no restrictions currently.  Her labs have improved significantly per her report. She follows a low sodium diet and rarely drinks alcohol.  She stated she is no longer on the transplant list and is monitored Q23mo.    Patient referred by self. Previously saw Dr Carmin Muskrat at [facility].    FERTILITY HISTORY/IMAGING:  Relationship: Married    The patient and her partner have been attempting  pregnancy for 4 years.   Prior fertility treatments/monitoring used:   Non ART cycles  C#/Mo/Yr CRH/Other Regimen Gn   Y/N IUI   Y/N  Pregnancy Other info   OI#1  11/2017 [facility] LE 5mg   unmonitored N N N N   OI#4/IUI#1/  2019 [facility] LE 7.5mg   N Y N IUI @ [facility]   OI#5  03/2018 [facility] LE 5mg   N N N    OI#6  04/2018 [facility] CC100 N N N 5 dominant, cycle canceled     Covid shutdown  Lupron Depot 10/2018  Ovarian drilling 03/2019       OI#7  08/2019 [facility] LE 5mg  X 5 N N N 1 dom., TIC. MLP=10.6   OI#8  09/2019 [facility] LE 5mg    N N N Ovulated before follicle scan, cycle canceled   OI#9/IUI#2  11/2019 [facility] LE 5mg  X 5 N Y N 1 dom, 1 int (16,15), IUI 17.9 million TMC   OI#10/IUI#3  12/2019 [facility] CC50mg   Re-dose N Y N 1 dom., IUI 24.5 million. P4=18.4              Prior Contraception used: OCP's, Nexplanon  Prior Imaging: Multiple u/s w/prev treatment      Ethnicity: Caucasian  Patient tested for: Genetic Carrier Testing:  Not done .  TSH: 4.0 (05/2017)    Family History:  Non contrib; MGF has Pompe disease; pt's mother, MGM and  brother all have hearing loss.    Birth Defects?  None known   Mental Disabilities? None known    GYN HX:  Menstrual History:  Patient's last menstrual period was 03/16/2020.  Menstrual History  Age of Menarche: 67  Period Duration (Days): 2-7  Period Pattern: (!) Irregular  Menstrual Flow: Moderate  Dysmenorrhea: (!) Moderate  Regular Intercourse?  Yes       Dyspareunia?  No  History of STD's?  No  History of Abn PAP? No   Last PAP: Not sure how up to date  Excessive hair growth, acne? ++ Hirsutism, ++ Acne   Breast discharge/lactation? Denies  Hot flashes/vaginal dryness? Denies  Weight gain, Exercise? C/o weight gain within last 2 years, 50-60lbs within last 2 years.  weight has always fluctuated.  Follows Weight Watchers on and off and low-sodium diet due to liver.  Exercises a few times per week with walks.     PARTNER HISTORY:  Name:  Rebeka Kimble       DOB: 09/24/1994  Ethnicity: Caucsian   Pertinent Medical or  Surgical History: Med hx: Neg except for back pain    Surg hx: wrist surgery X 3  Allergies: NKDA   Meds: Robaxin PRN for back pain, Denies supplements or testosterone use  Social History: No tob., Rare Etoh use.  No exposures.   Urologic History: Neg  Normal CSA by report - 3 sperm washes 2020/2021, 17-24 million TMC  Family History:  Non contrib   Mental Disabilities: None known   Birth Defects: None known  # pregnancies from this relationship: 1 (early S AB)  # pregnancies from prior relationship: 0    Past Medical History:   Diagnosis Date    Acne     Amenorrhea     Autoimmune hepatitis (CMS Dx) 03/2016    Bilateral leg edema     Cirrhosis (CMS Dx)     Dysmenorrhea     Esophageal varices (CMS Dx) 05/2016    Grade 1/small, no history of bleeding    H/O infertility     PCOS (polycystic ovarian syndrome)      Past Surgical History:   Procedure Laterality Date    LIVER BIOPSY      UPPER GASTROINTESTINAL ENDOSCOPY       Current Outpatient Medications   Medication Sig Dispense Refill    azaTHIOprine (IMURAN) 50 mg tablet Take 2 tablets (100 mg total) by mouth daily. 60 tablet 6    bromocriptine (PARLODEL) 2.5 mg tablet Take 2.5 mg by mouth daily.      hydroCHLOROthiazide (HYDRODIURIL) 25 MG tablet Take 25 mg by mouth daily.      prenatal vit,cal 74/iron/folic (PRENATAL VITAMIN 1+1 ORAL) Take by mouth.       No current facility-administered medications for this visit.     Allergies   Allergen Reactions    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    Penicillins     Latex Rash     Social History     Socioeconomic History    Marital status: Married     Spouse name: Not on file    Number of children: Not on file    Years of education: Not on file    Highest education level: Not on file   Occupational History    Not on file   Tobacco Use    Smoking status: Never Smoker    Smokeless tobacco: Never Used  Substance and Sexual Activity    Alcohol  use: No    Drug use: No    Sexual activity: Yes     Partners: Male     Birth control/protection: None   Other Topics Concern    Caffeine Use Yes    Occupational Exposure Not Asked    Exercise Yes    Seat Belt Yes   Social History Narrative    Not on file     Social Determinants of Health     Financial Resource Strain:     Difficulty of Paying Living Expenses:    Food Insecurity:     Worried About Programme researcher, broadcasting/film/video in the Last Year:     Barista in the Last Year:    Transportation Needs:     Freight forwarder (Medical):     Lack of Transportation (Non-Medical):    Physical Activity:     Days of Exercise per Week:     Minutes of Exercise per Session:    Stress:     Feeling of Stress :    Social Connections:     Frequency of Communication with Friends and Family:     Frequency of Social Gatherings with Friends and Family:     Attends Religious Services:     Active Member of Clubs or Organizations:     Attends Engineer, structural:     Marital Status:    Intimate Partner Violence:     Fear of Current or Ex-Partner:     Emotionally Abused:     Physically Abused:     Sexually Abused:      OB History   Gravida Para Term Preterm AB Living   1       1     SAB TAB Ectopic Multiple Live Births                  # Outcome Date GA Lbr Len/2nd Weight Sex Delivery Anes PTL Lv   1 AB 2017 [redacted]w[redacted]d    SAB        Family History   Problem Relation Age of Onset    Hearing loss Mother     No Known Problems Father     Pompe disease Maternal Grandfather     Hearing loss Brother     Hearing loss Maternal Grandmother     Anesthesia problems Neg Hx        REVIEW OF SYSTEMS:  General: negative  Psychological: negative  ENT: negative  Hematological and Lymphatic: negative  Endocrine: negative  Breast: negative for breast lumps  Respiratory: no cough, shortness of breath, or wheezing  Cardiovascular: no chest pain or dyspnea on exertion  Gastrointestinal: no abdominal pain, change in bowel  habits, or black or bloody stools  Genito-Urinary: no dysuria, trouble voiding, or hematuria  Musculoskeletal: negative  Neurological: negative  Dermatological: negative    PHYSICAL EXAM:    BP 126/72   Ht 5' 6 (1.676 m)   Wt (!) 251 lb (113.9 kg)   LMP 03/16/2020 Comment: Spotting  BMI 40.51 kg/m    Gen: NAD, A&Ox4  HEENT: PERRLA, EOMI, MMM  Abd: NT, ND, No R/G  Skin: No rashes  GU: Normal external genitalia.  See GYN Ultrasound Below  Ext:  No calf pain, no lower ext pitting or edema bilaterally     Patient's last menstrual period was 03/16/2020.    Labs:    2018: [facility])  FSH:=4.7, LH=11.1  AMH=7.32  TSH=4.01  PRL=28.5  Total T=289 (HH) (8.4-48.1 ng/dL)  ZOXW=960 (H) (45.4-098)  Testosterone, Free & Weakly bound=40 (HH) (4.11-7.83 ng/dL)  Testosterone, JXBJ=4.78 (H) (0.175-.334)  Androgen Free=8.09 (H) (0.297-5.62%)    12/31/2019 - [facility]  Cadmium <0.5  Lead Not indicative of lead poisoning  Mercury <1.0  Arsenic 5                  Assessment:    24 y.o. G1 P0010 with a hx of PCOS, oligoamenorrhea, marked hyperandrogenemia/hyperandrogenism, obesity and infertility  Hx ovarian drilling 1 year ago  Presumed normal semen parameters (normal Sperm wash X 3)  Patent tubes by report, records not available.      Plan:    -  Hormonal evaluation- AMH, TSH, Prolactin.  PCOS Labs  -  Procreative management - CBC/CMP, Rubella, Varicella, ABO + Ab, STD's today.    Genetic Screening: strongly recommend comprehensive panel today given family hx Pompe disease and hearing loss in 3 immediate family members. Pt agreeable to this testing.   -  Infertility evaluation - SIS/TT if proceeding with ART in future; Updated CSA recommended if proceeding with ART in future  -  PNV + Folic Acid recommended  -  Infertility Management:    Discussed that length of time of infertility and many failed OI cycles indicate that IVF is best chance for pregnancy.  Pt states this is concurrent with what she was told by previous provider.   Would not  expect significantly different result from additional OI/IUI's done here vs another center.  Appeared to have reasonable follicular response to OI medication, good post-wash TMC and appropriate post-ovulatory progesterone levels, unfortunately still none of these cycles established a pregnancy.     Would not offer GNT outside the safety of IVF d/t very high chance for robust response and risk of HOM   Ovarian drilling can be effective when applied to right population, some patients will resume menses and may be able to spontaneously ovulate/achieve pregnancy after ovarian drilling.  This was not the case for her, not sure that any repeat ovarian procedures will offer any additional benefit. Unfamiliar with ovarian 'slicing' procedure pt expressed interest in.    Markedly elevated androgens and hyperandrogenism is c/w PCOS, Dr Tiburcio Pea' note indicated that adrenal pathology was r/o by normal CT.   -  Counseling:  Discussed normal menstrual cycle, rates of fertility in general population, causes of infertility, testing required, R/B/A of treatment options.  -  RTO for complex follow-up with physician after labs are resulted.  Will review all testing and discuss infertility management at next follow up       Alanzo Lamb ANN Tyrin Herbers, APRN, CNP  Board Certified Nurse Practitioner [facility])  [facility] [place]    LOS was determine by medical decision making summarize below:      Number and Complexity of Problems (Level 5)  During this encounter, I addressed 1 acute or chronic illness or injury that is a threat to life or bodily function (I.e. reproduction).      Amount and/or Complexity of Data Ordered, Reviewed, or Analyzed (Level 4)  I reviewed 3+ external note(s) from: [facility], [facility]  I reviewed 3+ test(s) including: FSH, LH, TT, SHBG, Androgens  I ordered 3+ test(s) including:PCOS labs, pre conception, CBC, CMP, STI's, genetic screening    Risk of Complication and/or Morbidity or Mortality of Patient  Management (Level  5)  The patient's management entails High risk of complications and/or  morbidity or mortality.  I discussed invasive fertility testing and reviewed r/b/a of this diagnostic testing  Overall LOS:  5    Time Spent With Patient:   I spent a total of 75 minutes with patient/couple and >= 50% of time was spent discussing/counseling regarding problem, patient education, management options and treatment plan as documented above.     Gynecologic Ultrasound    Patient's last menstrual period was 03/16/2020.     OB History     Gravida   1    Para        Term        Preterm        AB   1    Living           SAB        TAB        Ectopic        Multiple        Live Births                     Type of Ultrasound:  Transvaginal    Clinical Information:  Hx of PCOS, ovarian drilling, hyperandrogenism/hyperandrogenemia    Uterus:  51 X 33 X 46mm, tot vol 41mL; normal uterine contour     SIS:  no    Endometrial Thickness:  10.76mm    Right Ovary:  16 X 31 X 28mm, tot vol 7.59mL    Left Ovary:  22 X 34 X 44mm, tot vol 17.50mL; Markedly larger than Rt ovary, appearance c/w PCOM    Comments:  No free fluid    Impression:   Lt ovary classically PCOM.  Rt ovary unremarkable.      Karmen Bongo, CNP

## 2020-04-16 NOTE — Progress Notes (Signed)
Labs

## 2020-04-18 ENCOUNTER — Ambulatory Visit: Payer: TRICARE (CHAMPUS) | Attending: Reproductive Endocrinology

## 2020-04-21 NOTE — Progress Notes (Signed)
Faxed fertility lifeline and medical release form to her obgyn and received a confirmation fax that they did go through

## 2020-05-07 ENCOUNTER — Ambulatory Visit: Payer: TRICARE (CHAMPUS)

## 2020-05-08 ENCOUNTER — Ambulatory Visit: Admit: 2020-05-08 | Discharge: 2020-05-08 | Payer: TRICARE (CHAMPUS) | Attending: Reproductive Endocrinology

## 2020-05-08 ENCOUNTER — Institutional Professional Consult (permissible substitution): Admit: 2020-05-08 | Discharge: 2020-05-08 | Payer: TRICARE (CHAMPUS) | Attending: Reproductive Endocrinology

## 2020-05-08 DIAGNOSIS — E8881 Metabolic syndrome: Secondary | ICD-10-CM

## 2020-05-08 DIAGNOSIS — Z029 Encounter for administrative examinations, unspecified: Secondary | ICD-10-CM

## 2020-05-08 LAB — INSULIN: Insulin: 37.6 u[IU]/mL (ref 2.6–24.9)

## 2020-05-08 MED ORDER — metFORMIN (GLUCOPHAGE-XR) 500 MG 24 hr tablet
500 | ORAL_TABLET | ORAL | 4 refills | Status: AC
Start: 2020-05-08 — End: ?

## 2020-05-08 MED ORDER — levothyroxine (SYNTHROID) 50 MCG tablet
50 | ORAL_TABLET | Freq: Every morning | ORAL | 0 refills | Status: AC
Start: 2020-05-08 — End: 2021-01-23

## 2020-05-08 NOTE — Unmapped (Signed)
CC:  Follow up, discuss test results/next steps     HPI: 24 y.o. G60P0010 female here today to discuss the results of her diagnostic testing and discuss treatment options.     Diagnosis:  Irregular cycles  PCO  Multiple failed cycles of OI/IUI  History of ovarian drilling 2020  History of Depo-Lupron  History of borderline hypothyroidism  History of autoimmune hepatitis. they do not know what is causing it  History of high testosterone levels (289)  Very high LH/FSH      Pertinent History:  Duration of infertility:4 years  Cavity/tubal assessment:Tubes were open at L/Scopic ovarian drilling  Ovarian reserve:AMH 7.3  Genetic carrier screening: Inheritest negative  MGF has Pompe disease; pt's mother, MGM and brother all have hearing loss      Rec'd Records from [facility]  ??  Labs 12/31/2019  Cadmium <0.5  Lead Not indicative of lead poisoning  Mercury <1.0  Arsenic 5    Non ART cycles  C#/Mo/Yr CRH/Other Regimen Gn   Y/N IUI   Y/N  Pregnancy Other info   OI#1-11/2017 [facility] LE 5mg   unmonitored N N N N   OI#4/IUI#16/2019 [facility] LE 7.5mg   N Y N IUI @ [facility]   OI#5  03/2018 Other LE 5mg   N N N ??   OI#6  04/2018 Other CC100 N N N 5 dominant, cycle canceled   OI#7  08/2019 [facility] LE 5mg  X 5 N N N 1 dom., TIC. MLP=10.6   OI#8  09/2019 [facility] LE 5mg   ?? N N N Ovulated before follicle scan, cycle canceled   OI#9/IUI#2  11/2019 [facility] LE 5mg  X 5 N Y N 1 dom, 1 int (16,15), IUI 17.9 million TMC   OI#10/IUI#3  12/2019 [facility] CC50mg   Re-dose? N Y N 1 dom., IUI 24.5 million. P4=18.4   OI#11  03/2020 [facility] CC ?? ?? ?? ??   ??     Lupron Depot injection to reset her system.  Records reflect Lupron Depot 11.25mg  injection given 10/20/2018, pt stated menses did not resume for 6 months following this.    She then had laparoscopic ovarian drilling in 03/2019 (op note not provided) both sides but menses did not resume after the ovarian drilling; she also had no improvement in symptoms of hyperandrogenism at that time.    Pt also reports hx of  autoimmune hepatitis, incidental finding by dermatologist when getting routine labs before starting medication.  She is under the care of GI and has no restrictions currently.  Her labs have improved significantly per her report. She follows a low sodium diet and rarely drinks alcohol.  She stated she is no longer on the transplant list and is monitored Q54mo.    PARTNER HISTORY:  Name:?? Janye Maynor???????????? DOB: 09/24/1994  Normal CSA by report - 3 sperm washes 2020/2021, 17-24 million [facility]  # pregnancies from this relationship: 1 (early SAB)      Review of Systems:  The following systems were reviewed and negative, except those noted in HPI: General, cardiovascular, respiratory, gastrointestinal, genitourinary, musculoskeletal, skin, neurologic, psychiatric, endocrine, heme, allergy    Objective:  There were no vitals filed for this visit.  General: No acute distress, alert & oriented x 3  Head: normocephalic, pupils equal  Extremities: no significant edema    Results Review:     Lab Results   Component Value Date    ABOGROUP A 04/16/2020    RH Positive 04/16/2020    ABS Negative 04/16/2020  HGB 12.3 04/16/2020    HCT 35.8 04/16/2020    CREATININE 0.75 04/16/2020    ANTIMULLHORM 7.07 04/16/2020    FSH 4.7 05/26/2017    POCTFSH 3.28 04/16/2020    LH 11.1 05/26/2017    POCTESTRDIOL 369.72 04/16/2020    POCTPROGSTRN 12.30 04/16/2020    TESTOSTERONE 118 (H) 04/16/2020    PROLACTIN 28.5 05/26/2017    TSH 2.520 04/16/2020    HGBA1C 4.5 (L) 04/16/2020     Lab Results   Component Value Date    RUBELLAIGG 5.74 04/16/2020    RUBNUM 7.00 (H) 12/20/2016    VARICELLAIGG <135 (L) 04/16/2020    HIV12ABAGN Nonreactive 12/20/2016    HEPBSAG Negative 04/16/2020    HCVAB 0.2 04/16/2020    RPR Non Reactive 04/16/2020    TREPIA Negative 12/20/2016      Assessment:   24 y.o. G1P0010 with:  Irregular cycles  PCO  Multiple failed cycles of OI/IUI  History of ovarian drilling 2020  History of Depo-Lupron  History of borderline  hypothyroidism  History of autoimmune hepatitis. they do not know what is causing it  History of high testosterone levels (289)  Very high LH/FSH  VNI  High MCV      Plan:   Had questions about ovarian wedge resection. I explained that I do not perform that procedure due to surgery risks, adhesion risks, ovarian reserve risks, and fading effectiveness that sometimes occurs only a few months after surgery.  LFTs have normalized  Offered 50 mcg Levothyroxine for borderline TSH levels. She will start and repeat levels in 2 months.  Recommend Varicella vaccine  Explained that she has higher MCV. Offered Hematology consult. Plans to take 1 mg B12 and Folate and repeat testing  Fasting insulin drawn today (last test 3 years ago she says).  Long discussion of insulin resistance. We discussed the cellular mechanisms of IR, and their strong association of ovulation problems/PCO. We discussed the natural (Decreased carbs/increased protein // exercise with a goal of 30+ minutes a day, can be lower intensity) and artificial methods (Metformin with very modest benefit, beginning with 500mg /day and slowly advancing dose to 2000mg /day as tolerated to GI side effects). Also discussed Wegovy.    I spent 60 minutes face to face with this patient with greater than 50% of this time spent in counseling and coordination of care discussing items in the Assessment and Plan above. I also spent 5 minutes on the same day as the encounter performing additional activities related to this service. This time excludes time spent on any separately billed services.     Burman Blacksmith MD  Reproductive Endocrinologist

## 2020-05-08 NOTE — Unmapped (Signed)
Labs

## 2020-05-29 NOTE — Telephone Encounter (Signed)
Spoke with patient, scheduled follow up with Dr Zara Chess for 9/23 @ [facility].

## 2020-05-29 NOTE — Telephone Encounter (Signed)
Please call patient with lab results from 05-08-20 and next steps

## 2020-05-29 NOTE — Unmapped (Signed)
Pls call pt to schedule a follow up with Dr Zara Chess (ok if it is a phone consult!)

## 2020-05-30 ENCOUNTER — Ambulatory Visit: Payer: TRICARE (CHAMPUS)

## 2020-06-05 ENCOUNTER — Ambulatory Visit: Admit: 2020-06-05 | Discharge: 2020-06-05 | Payer: TRICARE (CHAMPUS) | Attending: Reproductive Endocrinology

## 2020-06-05 DIAGNOSIS — E8881 Metabolic syndrome: Secondary | ICD-10-CM

## 2020-06-05 MED ORDER — levothyroxine (SYNTHROID) 50 MCG tablet
50 | ORAL_TABLET | Freq: Every morning | ORAL | 3 refills | Status: AC
Start: 2020-06-05 — End: 2021-01-23

## 2020-06-05 NOTE — Unmapped (Signed)
CC:  Follow up, discuss test results/next steps     HPI: 24 y.o. G40P0010 female here today to discuss the results of her diagnostic testing and discuss treatment options.   Has seen Amy Harris    Diagnosis:  Irregular cycles  PCO  Multiple failed cycles of OI/IUI  History of ovarian drilling 2020  History of Depo-Lupron  History of borderline hypothyroidism  History of autoimmune hepatitis. 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  ??  Pertinent History:  Duration of infertility:4 years  Cavity/tubal assessment:Tubes were open at L/Scopic ovarian drilling  Ovarian reserve:AMH 7.3  Genetic carrier screening: Inheritest negative  MGF has Pompe disease; pt's mother, MGM and brother all have hearing loss    Labs 12/31/2019  Cadmium <0.5  Lead Not indicative of lead poisoning  Mercury <1.0  Arsenic 5  ??  Non ART cycles  C#/Mo/Yr CRH/Other Regimen Gn   Y/N IUI   Y/N  Pregnancy Other info   OI#1-11/2017 [facility] LE 5mg   unmonitored N N N N   OI#4/IUI#16/2019 [facility] LE 7.5mg   N Y N IUI @ [facility]   OI#5  03/2018 Other LE 5mg   N N N ??   OI#6  04/2018 Other CC100 N N N 5??dominant, cycle canceled   OI#7  08/2019 [facility] LE 5mg  X 5 N N N 1 dom., TIC. MLP=10.6   OI#8  09/2019 [facility] LE 5mg   ?? N N N Ovulated before follicle scan, cycle canceled   OI#9/IUI#2  11/2019 [facility] LE 5mg  X 5 N Y N 1 dom, 1 int (16,15), IUI Tracy.9 million TMC   OI#10/IUI#3  12/2019 [facility] CC50mg   Re-dose? N Y N 1 dom., IUI 24.5 million. P4=18.4   OI#11  03/2020 [facility] ?? ?? ?? ??   ??  ??  ??Lupron Depot injection to reset her system. ??Records reflect Lupron Depot 11.25mg  injection??given 10/20/2018, pt stated menses did not resume for 6 months following this. ??  She then had laparoscopic ovarian drilling in 03/2019 (op note??not provided) both sides but menses did not resume after the ovarian drilling; she??also??had no  improvement in symptoms of hyperandrogenism at that time. ??  Pt also reports hx of autoimmune hepatitis, incidental finding by dermatologist when getting routine labs before starting medication. ??She is under the care of GI and has no restrictions currently. ??Her labs have improved significantly per her report. She follows a low sodium diet and rarely drinks alcohol. ??She stated she is no longer on the transplant list and is monitored Q62mo.  ??  PARTNER HISTORY:  Name:????Earna Coder Hang??????????????DOB: 09/24/1994  Normal CSA by report - 3 sperm washes 2020/2021, Tracy-24 million [facility]  # pregnancies from this relationship:??1 (early SAB)  ??  ??    Review of Systems:  The following systems were reviewed and negative, except those noted in HPI: General, cardiovascular, respiratory, gastrointestinal, genitourinary, musculoskeletal, skin, neurologic, psychiatric, endocrine, heme, allergy  Wt Readings from Last 5 Encounters:   05/08/20 (!) 242 lb (109.8 kg)   05/11/17 207 lb (93.9 kg)   04/16/20 (!) 251 lb (113.9 kg)   05/Tracy/21 (!) 251 lb (113.9 kg)   07/09/19 (!) 230 lb (104.3 kg)       Objective:  There were no vitals filed for this visit.  General: No acute distress, alert & oriented x 3  Head: normocephalic, pupils equal    Results Review:     Lab Results   Component Value Date    ABOGROUP A 04/16/2020    RH Positive 04/16/2020    ABS Negative 04/16/2020    HGB 12.3 04/16/2020    HCT 35.8 04/16/2020    CREATININE 0.75 04/16/2020    ANTIMULLHORM 7.07 04/16/2020    FSH 4.7 05/26/2017    POCTFSH 3.28 04/16/2020    LH 11.1 05/26/2017    POCTESTRDIOL 369.72 04/16/2020    POCTPROGSTRN 12.30 04/16/2020    TESTOSTERONE 118 (H) 04/16/2020    PROLACTIN 28.5 05/26/2017    TSH 2.520 04/16/2020    HGBA1C 4.5 (L) 04/16/2020     Lab Results   Component Value Date    RUBELLAIGG 5.74 04/16/2020    RUBNUM 7.00 (H) 12/20/2016    VARICELLAIGG <135 (L) 04/16/2020    HIV12ABAGN Nonreactive 12/20/2016    HEPBSAG Negative 04/16/2020    HCVAB 0.2  04/16/2020    RPR Non Reactive 04/16/2020    TREPIA Negative 12/20/2016      Assessment:   24 y.o. G1P0010 with severe insulin resistance, anovulation, and hyper androgenemia.   Started 50 mcg of levothyroxine per day  Has started Metformin  Has follow-up questions    Plan:   Husband present.  Questioning ovarian wedge resection option again.  I stated that ovulation is improved for several months or more after surgery, but surgery risks in my opinion outweigh possible benefits.  I have not personally done an ovarian wedge resection for approximately 30 years, and I informed him of that.  Technically the surgery is not a difficult but would require a laparotomy if a wedge resection is desired.  Using gonadotropins would be helpful, but the risks of OHSS and multiple pregnancy would be very high.  These are ameliorated with IVF procedure.  IVF would be their best choice.  Informed that Reginal Lutes is now available.  I suspect that they will try to improve their insulin resistance with Metformin, possibly Wegovy, diet and exercise.  I explained that we could try letrozole 10 mg + IUI a couple cycles if they desired.  They will consider the options.  I spent 35 minutes face to face with this patient with greater than 50% of this time spent in counseling and coordination of care discussing items in the Assessment and Plan above. I also spent 5 minutes on the same day as the encounter performing additional activities related to this service. This time excludes time spent on any separately billed services.     Burman Blacksmith MD  Reproductive Endocrinologist

## 2020-06-12 NOTE — Unmapped (Signed)
Patient called regarding bill she received for DOS 05/08/20 stating [employer] told her it needs to be sent to them to process.  I explained that it did get billed to [facility] and the balance is deductible per EOB in system.  She will call [employer] back and speak with them and then I gave her my direct phone number to call me.

## 2020-06-13 ENCOUNTER — Ambulatory Visit: Payer: TRICARE (CHAMPUS)

## 2020-06-27 NOTE — Unmapped (Signed)
Faxed patient's records to [facility] and received a confirmation fax that it did go through to their office

## 2020-07-25 ENCOUNTER — Ambulatory Visit: Admit: 2020-07-25 | Payer: TRICARE (CHAMPUS)

## 2020-07-25 DIAGNOSIS — K754 Autoimmune hepatitis: Secondary | ICD-10-CM

## 2020-07-25 MED ORDER — azaTHIOprine (IMURAN) 50 mg tablet
50 | ORAL_TABLET | Freq: Every day | ORAL | 6 refills | Status: AC
Start: 2020-07-25 — End: 2021-04-15

## 2020-07-25 NOTE — Unmapped (Signed)
Transplant Hepatology Follow Up     HPI:                                                                                                                             This is a follow up visit for Tracy Watkins, 24 y.o. 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 The patient was diagnosed with AIH in June 2017 and was listed for liver transplant at [facility]) in [place]. 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 Her liver tests were elevated - AST-266, ALT-210, ALP-255, TB-2.8 with imaging suggestive of cirrhosis. Her serological evaluation was strongly suggestive of AIH - + ANA, ASMA-74, IgG-3200, AMA-negative. She underwent liver biopsy in June 2017 which revealed underlying cirrhosis with severe hepatitis - predominantly lymphoplasmacytic infiltrate with interface activity, hepatocyte dropout, incomplete granulmomas, and bile ductular proliferation. As a result, she was initiated on high dose prednisone 60 mg and eventually azathioprine 100 mg was added in late July 2017. Due to plateaued liver tests, she was transitioned to Cellcept 1 gram BID in August / Sept 2017 and tapered rapidly off of prednisone. Then switched to Imuran 125 mg daily. Other than mild jaundice, she is not clinically decompensated without any GI bleeding, ascites or confusion. Her EGD in Sept 2017 had evidence of small varices. Had EGD in Dec 2019 at [facility] which did not show any varicesHer evaluation for transplant was unremarkable in [place]. She moved to [place], [place] in Jan 2018 due to husband's job in [employer].  She presents today for follow up. Reports doing well. She was recently started on Metformin for insulin resistance. On Imuran 100 mg daily with good control of LFTs. Tolerating her Imuran well. On infertility treatment. Functional status is good.      ROS:  A comprehensive review of systems was done and was negative other than HPI.   Answers for HPI/ROS submitted by the patient on 07/24/2020  Decreased Appetite: No  Chills: No  Fatigue : No  Fever: No  Insomnia: No  Weight loss: No  Blurred vision: No  Double vision: No  Change in color vision: No  Chest Pain: No  Palpitations: No  Difficulty Breathing: No  Cough: No  Abdominal pain: No  Heart burn: No  Nausea: No  Vomiting: No  Constipation: No  Diarrhea: No  Blood in stool / on toilet paper: No  Black stool: No  Tarry stool: No  Dry Mouth: No  Arthritis: No  Neck pain: No  Muscle aching: No  Muscle weakness: No  Thyroid diorder: No  Excessive thirst: No  Flushing: No  Feeling to hot: No  Feeling to cold: No  Injection site reaction: No  Hair loss: No  Rash: No  Itching: No  Dry skin: No  Increased Sweating: No  Headaches: No  Dizziness: No  Memory impairment : No  Suicidal thoughts: No  Nervousness / Anxious: No  Depression: No  Difficulty concentrating: No  Mood alterations: No  Irritability: No  Abnormal brusing: No  Abnormal bleeding: No  Swollen lymph nodes: No      Past Medical History:  Past Medical History:   Diagnosis Date   ??? Acne    ??? Amenorrhea    ??? 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 Bilateral leg edema    ??? Cirrhosis (CMS Dx)    ??? Dysmenorrhea    ??? Esophageal varices (CMS Dx) 05/2016    Grade 1/small, no history of bleeding   ??? H/O infertility    ??? PCOS (polycystic ovarian syndrome)      Allergies:  Allergies   Allergen Reactions   ??? Latex Rash     Other reaction(s): Rash, urticarial  hives  hives     ??? Penicillins Rash   ??? 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     Medications:  Current Outpatient Medications   Medication Sig   ??? azaTHIOprine Take 2 tablets (100 mg total) by mouth daily.   ??? bromocriptine Take 2.5 mg by mouth daily.   ??? hydroCHLOROthiazide Take 25 mg by mouth daily.   ??? levothyroxine Take 1 tablet (50 mcg total) by  mouth every morning before breakfast.   ??? levothyroxine Take 1 tablet (50 mcg total) by mouth every morning before breakfast.   ??? metFORMIN 2 TAB PO BID WITH MEALS Indications: polycystic ovarian syndrome   ??? prenatal vit,cal 74/iron/folic (PRENATAL VITAMIN 1+1 ORAL) Take by mouth.     No current facility-administered medications for this visit.     Physical Examination:  Vitals:    07/25/20 1012   BP: 110/75   BP Location: Right arm   Patient Position: Sitting   BP Cuff Size: Regular   Pulse: 84   SpO2: 98%   Weight: (!) 238 lb 12.8 oz (108.3 kg)   Height: 5' 6 (1.676 m)   General: Young female, NAD  Neck: Supple  Head: PERRLA, moist oral mucosa  Pulmonary: Good air entry b/l, normal breath sounds   Abdomen: Soft, ND, NT, no surgical scars or hernias  Neuro: Awake, alert, oriented x 3, no asterixis  Extremities: No LE edema  Skin: No rash or jaundice    Labs:  Lab name 12/20/16  1434   HEMOGLOBIN 12.6   HEMATOCRIT 38.4   MEAN CORPUSCULAR VOLUME 91.1   PLATELETS 96*   SODIUM 135   POTASSIUM 4.0   CHLORIDE 105   CO2 25   BUN 17   CREATININE 0.70   GLUCOSE 78   PHOSPHORUS 2.9   ALBUMIN 3.3*   3.3*   CALCIUM 9.3   AST 65*   ALT 52   BILIRUBIN TOTAL 2.1*   ALK PHOS 147*   INR 1.5*     Assessment and Plan:  69 Y female with 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    - Autoimmune hepatitis: Will continue  Imuran 100 mg daily. Good control of liver enzymes. This can be continued even if she gets pregnant.   - Variceal screening/surveillance/prophylaxis: She is due for EGD now. She will try to have it done at [facility]. If not, will schedule at [facility].  [facility] screening: Ultrasound and AFP ordered today. Patient will do these at [facility].   - Immunizations: Immune to Hep A and  B  - Transplant status: Current MELD score is 10. She was on list for a year but then removed due to improvement in liver function.   - Transplant follow up in 6 months at [facility].    This note was completely edited, written and  reviewed by me and consists of information cut and pasted from the my most recent visit, my smart phrases and other Epic tools. I have personally reviewed all aspects of this note to at least include reviewing this patient's chart and problem list, updating the history, physical exam, lab and procedure results, and assessment and plan as detailed above and below.  As such this visit note reflects my current evaluation and management for this patient.    Tillman Abide, MD

## 2020-07-25 NOTE — Unmapped (Signed)
-   Continue Imuran 100 mg daily  - Blood work in next 4-6 weeks  - Ultrasound in next 4-6 weeks  - Will recommend flu shot every season  - Avoid aspirin, Ibuprofen, Aleeve, Motrin since these medications can increase the risk of bleeding and can cause kidney damage.  - Can take Tylenol up to 2000 mg in a 24 hour period.  - Complete abstinence from alcohol.  - Transplant follow up in 6 months at [facility]

## 2020-12-05 NOTE — Telephone Encounter (Signed)
Patient calling requesting her lab orders needed and orders for ultrasound to be faxed to [facility] location close to her house    Fax for labs  [phone]    Fax for ultrasound  [phone]    Next appointment scheduled for 5/13    Please advise

## 2021-01-23 ENCOUNTER — Ambulatory Visit: Admit: 2021-01-23 | Payer: TRICARE (CHAMPUS)

## 2021-01-23 DIAGNOSIS — K754 Autoimmune hepatitis: Secondary | ICD-10-CM

## 2021-01-23 NOTE — Unmapped (Signed)
Transplant Hepatology Follow Up     HPI:                                                                                                                             This is a follow up visit for Tracy Watkins, 25 y.o. 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 The patient was diagnosed with AIH in June 2017 and was listed for liver transplant at [facility]) in [place]. 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 Her liver tests were elevated - AST-266, ALT-210, ALP-255, TB-2.8 with imaging suggestive of cirrhosis. Her serological evaluation was strongly suggestive of AIH - + ANA, ASMA-74, IgG-3200, AMA-negative. She underwent liver biopsy in June 2017 which revealed underlying cirrhosis with severe hepatitis - predominantly lymphoplasmacytic infiltrate with interface activity, hepatocyte dropout, incomplete granulmomas, and bile ductular proliferation. As a result, she was initiated on high dose prednisone 60 mg and eventually azathioprine 100 mg was added in late July 2017. Due to plateaued liver tests, she was transitioned to Cellcept 1 gram BID in August / Sept 2017 and tapered rapidly off of prednisone. Then switched to Imuran 125 mg daily. Other than mild jaundice, she is not clinically decompensated without any GI bleeding, ascites or confusion. Her EGD in Sept 2017 had evidence of small varices. Had EGD in Dec 2019 at [facility] which did not show any varices. Her evaluation for transplant was unremarkable in [place]. She moved to [place], [place] in Jan 2018 due to husband's job in [employer]. She was on liver wait-list at [facility] but removed in May 2021 due to stable liver disease. On Metformin for insulin resistance.  She presents today for follow up. Reports doing well. On Imuran 100 mg daily with good control of LFTs. Tolerating her Imuran  well. On infertility treatment. Looking into IVF treatment in this summer. Functional status is good. Last Ultrasound was in April 2022. She has low back and hips arthritis as well as a new rash on her hands. NO acute issues today.     ROS:  A comprehensive review of systems was done and was negative other than HPI.     Past Medical History:  Past Medical History:   Diagnosis Date   ??? Acne    ??? Amenorrhea    ??? 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 Bilateral leg edema    ??? Cirrhosis (CMS Dx)    ??? Dysmenorrhea    ??? Esophageal varices (CMS Dx) 05/2016    Grade 1/small, no history of bleeding   ??? H/O infertility    ??? PCOS (polycystic ovarian syndrome)      Allergies:  Allergies   Allergen Reactions   ??? Latex Rash     Other reaction(s): Rash, urticarial  hives  hives     ??? Penicillins  Rash   ??? 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     Medications:  Current Outpatient Medications   Medication Sig   ??? azaTHIOprine Take 2 tablets (100 mg total) by mouth daily.   ??? bromocriptine Take 2.5 mg by mouth daily.   ??? hydroCHLOROthiazide Take 25 mg by mouth daily.   ??? levothyroxine Take 1 tablet (50 mcg total) by mouth every morning before breakfast.   ??? levothyroxine Take 1 tablet (50 mcg total) by mouth every morning before breakfast.   ??? metFORMIN 2 TAB PO BID WITH MEALS Indications: polycystic ovarian syndrome   ??? prenatal vit,cal 74/iron/folic (PRENATAL VITAMIN 1+1 ORAL) Take by mouth.     No current facility-administered medications for this visit.     Physical Examination:  Vitals:    01/23/21 0914   BP: 117/77   BP Location: Left arm   Patient Position: Sitting   BP Cuff Size: Large   Pulse: 88   SpO2: 99%   Weight: (!) 239 lb 9.6 oz (108.7 kg)   Height: 5' 6 (1.676 m)   General: Young female, NAD  Neck: Supple  Head: PERRLA, moist oral mucosa  Pulmonary: Good air entry b/l, normal breath sounds   Abdomen: Soft, ND, NT, no surgical scars or hernias  Neuro: Awake,  alert, oriented x 3, no asterixis  Extremities: No LE edema  Skin: Small patches of scaly erythematous rash on left hand, No jaundice    Labs:  Lab name 12/20/16  1434   HEMOGLOBIN 12.6   HEMATOCRIT 38.4   MEAN CORPUSCULAR VOLUME 91.1   PLATELETS 96*   SODIUM 135   POTASSIUM 4.0   CHLORIDE 105   CO2 25   BUN 17   CREATININE 0.70   GLUCOSE 78   PHOSPHORUS 2.9   ALBUMIN 3.3*   3.3*   CALCIUM 9.3   AST 65*   ALT 52   BILIRUBIN TOTAL 2.1*   ALK PHOS 147*   INR 1.5*     Assessment and Plan:  53 Y female with 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    - Autoimmune hepatitis: Will continue  Imuran 100 mg daily. Good control of liver enzymes. This can be continued even if she gets pregnant. Also discussed the option of Evusheld for COVID prevention. Patient will think about it.   - Arthritis and rash: Advised her to see rheumatologist for further evaluation   - Variceal screening/surveillance/prophylaxis: She is due for EGD now. Will refer to Dr. Berenda Morale at [facility].  - [facility] screening: Ultrasound and AFP will be due in October 2022.   - Immunizations: Immune to Hep A and B  - Transplant status: Current MELD score is 10. She was on list for a year but then removed due to improvement in liver function.   - Liver follow up in 6 months at [facility].    This note was completely edited, written and reviewed by me and consists of information cut and pasted from the my most recent visit, my smart phrases and other Epic tools. I have personally reviewed all aspects of this note to at least include reviewing this patient's chart and problem list, updating the history, physical exam, lab and procedure results, and assessment and plan as detailed above and below.  As such this visit note reflects my current evaluation and management for this patient.    Tillman Abide, MD

## 2021-01-23 NOTE — Unmapped (Addendum)
Transplant Hepatology Follow Up     HPI:                                                                                                                             This is a follow up visit for Tracy Watkins, 25 y.o. 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 The patient was diagnosed with AIH in June 2017 and was listed for liver transplant at [facility]) in [place]. 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 Her liver tests were elevated - AST-266, ALT-210, ALP-255, TB-2.8 with imaging suggestive of cirrhosis. Her serological evaluation was strongly suggestive of AIH - + ANA, ASMA-74, IgG-3200, AMA-negative. She underwent liver biopsy in June 2017 which revealed underlying cirrhosis with severe hepatitis - predominantly lymphoplasmacytic infiltrate with interface activity, hepatocyte dropout, incomplete granulmomas, and bile ductular proliferation. As a result, she was initiated on high dose prednisone 60 mg and eventually azathioprine 100 mg was added in late July 2017. Due to plateaued liver tests, she was transitioned to Cellcept 1 gram BID in August / Sept 2017 and tapered rapidly off of prednisone. Then switched to Imuran 125 mg daily. Other than mild jaundice, she is not clinically decompensated without any GI bleeding, ascites or confusion. Her EGD in Sept 2017 had evidence of small varices. Had EGD in Dec 2019 at [facility] which did not show any varices. Her evaluation for transplant was unremarkable in [place]. She moved to [place], [place] in Jan 2018 due to husband's job in [employer]. She was on liver wait-list at [facility] but removed in May 2021 due to stable liver disease. On Metformin for insulin resistance.  She presents today for follow up. Reports doing well. On Imuran 100 mg daily with good control of LFTs. Tolerating her Imuran  well. On infertility treatment. Looking into IVF treatment in this summer. Functional status is good. Last Ultrasound was in April 2022. She has low back and hips arthritis as well as a new rash on her hands. NO acute issues today.     ROS:  A comprehensive review of systems was done and was negative other than HPI.     Past Medical History:  Past Medical History:   Diagnosis Date   ??? Acne    ??? Amenorrhea    ??? 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 Bilateral leg edema    ??? Cirrhosis (CMS Dx)    ??? Dysmenorrhea    ??? Esophageal varices (CMS Dx) 05/2016    Grade 1/small, no history of bleeding   ??? H/O infertility    ??? PCOS (polycystic ovarian syndrome)      Allergies:  Allergies   Allergen Reactions   ??? Latex Rash     Other reaction(s): Rash, urticarial  hives  hives     ??? Penicillins  Rash   ??? 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     Medications:  Current Outpatient Medications   Medication Sig   ??? azaTHIOprine Take 2 tablets (100 mg total) by mouth daily.   ??? bromocriptine Take 2.5 mg by mouth daily.   ??? hydroCHLOROthiazide Take 25 mg by mouth daily.   ??? levothyroxine Take 1 tablet (50 mcg total) by mouth every morning before breakfast.   ??? levothyroxine Take 1 tablet (50 mcg total) by mouth every morning before breakfast.   ??? metFORMIN 2 TAB PO BID WITH MEALS Indications: polycystic ovarian syndrome   ??? prenatal vit,cal 74/iron/folic (PRENATAL VITAMIN 1+1 ORAL) Take by mouth.     No current facility-administered medications for this visit.     Physical Examination:  Vitals:    01/23/21 0914   BP: 117/77   BP Location: Left arm   Patient Position: Sitting   BP Cuff Size: Large   Pulse: 88   SpO2: 99%   Weight: (!) 239 lb 9.6 oz (108.7 kg)   Height: 5' 6 (1.676 m)   General: Young female, NAD  Neck: Supple  Head: PERRLA, moist oral mucosa  Pulmonary: Good air entry b/l, normal breath sounds   Abdomen: Soft, ND, NT, no surgical scars or hernias  Neuro: Awake,  alert, oriented x 3, no asterixis  Extremities: No LE edema  Skin: Small patches of scaly erythematous rash on left hand, No jaundice    Labs:  Lab name 12/20/16  1434   HEMOGLOBIN 12.6   HEMATOCRIT 38.4   MEAN CORPUSCULAR VOLUME 91.1   PLATELETS 96*   SODIUM 135   POTASSIUM 4.0   CHLORIDE 105   CO2 25   BUN 17   CREATININE 0.70   GLUCOSE 78   PHOSPHORUS 2.9   ALBUMIN 3.3*   3.3*   CALCIUM 9.3   AST 65*   ALT 52   BILIRUBIN TOTAL 2.1*   ALK PHOS 147*   INR 1.5*     Assessment and Plan:  53 Y female with 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    - Autoimmune hepatitis: Will continue  Imuran 100 mg daily. Good control of liver enzymes. This can be continued even if she gets pregnant. Also discussed the option of Evusheld for COVID prevention. Patient will think about it.   - Arthritis and rash: Advised her to see rheumatologist for further evaluation   - Variceal screening/surveillance/prophylaxis: She is due for EGD now. Will refer to Dr. Berenda Morale at [facility].  - [facility] screening: Ultrasound and AFP will be due in October 2022.   - Immunizations: Immune to Hep A and B  - Transplant status: Current MELD score is 10. She was on list for a year but then removed due to improvement in liver function.   - Liver follow up in 6 months at [facility].    This note was completely edited, written and reviewed by me and consists of information cut and pasted from the my most recent visit, my smart phrases and other Epic tools. I have personally reviewed all aspects of this note to at least include reviewing this patient's chart and problem list, updating the history, physical exam, lab and procedure results, and assessment and plan as detailed above and below.  As such this visit note reflects my current evaluation and management for this patient.    Tillman Abide, MD

## 2021-01-23 NOTE — Unmapped (Signed)
Called and spoke to patient and was able to get her scheduled for EGD at [facility] w/ Dr. Berenda Morale 02-05-2021

## 2021-01-23 NOTE — Unmapped (Signed)
Pt is parking she has a 9 am appt on her way

## 2021-01-23 NOTE — Unmapped (Signed)
RN called pt to let her know we received message. Pt states is just walking in now. DR Boykin Nearing aware.

## 2021-01-23 NOTE — Unmapped (Signed)
Patient is scheduled for a EGD @ [facility] w/ MAC w/ Dr. Berenda Morale on 02/05/2021. Pt instructed to arrive at 11:00 AM procedure is scheduled at 12:30PM. Prep instructions for NPO AFTER MIDNIGHT mailed to their verified address.

## 2021-01-23 NOTE — Unmapped (Signed)
-   Continue Imuran 100 mg daily  - Blood work in next 4-6 weeks  - EGD in next 4-6 weeks at [facility]  - Avoid aspirin, Ibuprofen, Aleeve, Motrin since these medications can increase the risk of bleeding and can cause kidney damage.  - Can take Tylenol up to 2000 mg in a 24 hour period.  - Complete abstinence from alcohol.  - Transplant follow up in 6 months at [facility]

## 2021-01-23 NOTE — Unmapped (Signed)
-----   Message from Wallene Dales, MD sent at 01/23/2021 11:08 AM EDT -----  Rip Harbour    Amy/Christina: Can you schedule her for an EGD as requested by Dr Boykin Nearing at [facility]?    Thanks    Wallene Dales  ----- Message -----  From: Tillman Abide, MD  Sent: 01/23/2021   9:33 AM EDT  To: Wallene Dales, MD    Can you please do an EGD on her at [facility] for variceal screening

## 2021-02-03 NOTE — Unmapped (Signed)
Insurance company called about the referral needed to approve the procedure on 02-05-2021    I called the patient and she stated it was already approved but was following up with the insurance company this moring to make sure

## 2021-02-05 ENCOUNTER — Ambulatory Visit: Admit: 2021-02-05 | Payer: TRICARE (CHAMPUS)

## 2021-02-05 LAB — POC HCG QUALITATIVE, URINE: hCG Qualitative -Clinitek: NEGATIVE

## 2021-02-05 MED ORDER — sodium chloride 0.9 % infusion
INTRAVENOUS | Status: AC
Start: 2021-02-05 — End: 2021-02-05
  Administered 2021-02-05: 16:00:00 50 mL/h via INTRAVENOUS

## 2021-02-05 MED ORDER — propofol (DIPRIVAN) infusion 10 mg/mL
10 | INTRAVENOUS | Status: AC | PRN
Start: 2021-02-05 — End: 2021-02-05
  Administered 2021-02-05: 17:00:00 200 via INTRAVENOUS

## 2021-02-05 MED ORDER — lidocaine (PF) 20 mg/mL (2 %) Soln
20 | INTRAVENOUS | Status: AC | PRN
Start: 2021-02-05 — End: 2021-02-05
  Administered 2021-02-05: 17:00:00 200 via INTRAVENOUS

## 2021-02-05 MED ORDER — propofol 10 mg/ml (DIPRIVAN) injection
10 | INTRAVENOUS | Status: AC | PRN
Start: 2021-02-05 — End: 2021-02-05
  Administered 2021-02-05 (×4): 50 via INTRAVENOUS

## 2021-02-05 MED ORDER — glycopyrrolate (ROBINUL) injection
0.2 | INTRAMUSCULAR | Status: AC | PRN
Start: 2021-02-05 — End: 2021-02-05
  Administered 2021-02-05: 17:00:00 .2 via INTRAVENOUS

## 2021-02-05 MED FILL — SODIUM CHLORIDE 0.9 % INTRAVENOUS SOLUTION: 50.00 50.00 mL/hr | INTRAVENOUS | Qty: 1000

## 2021-02-05 NOTE — Unmapped (Addendum)
DEPARTMENT OF ANESTHESIOLOGY  PRE-PROCEDURAL EVALUATION    Tracy Watkins is a 25 y.o. year old female presenting for:    Procedure(s):  ESOPHAGOGASTRODUODENOSCOPY WITH MAC    Surgeon:   Wallene Dales, MD    Chief Complaint     Esophageal varices without bleeding, unspecified esophageal varices type (*    Review of Systems     Anesthesia Evaluation         History of anesthetic complications         Cardiovascular:    Exercise tolerance: good    (-) hypertension.    Neuro/Muscoloskeletal/Psych:        Pulmonary:      (-) asthma, sleep apnea.       GI/Hepatic/Renal:    (+) liver disease.    (-) GERD, PUD, renal disease.    Comments: Autoimmune Hepatitis    Endo/Other:    (+) thrombocytopenia.      (-) hypothyroidism, no anemia.       Past Medical History     Past Medical History:   Diagnosis Date   ??? Acne    ??? Amenorrhea    ??? 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 Bilateral leg edema    ??? Cirrhosis (CMS Dx)    ??? Dysmenorrhea    ??? Esophageal varices (CMS Dx) 05/2016    Grade 1/small, no history of bleeding   ??? H/O infertility    ??? PCOS (polycystic ovarian syndrome)        Past Surgical History     Past Surgical History:   Procedure Laterality Date   ??? LIVER BIOPSY     ??? UPPER GASTROINTESTINAL ENDOSCOPY         Family History     Family History   Problem Relation Age of Onset   ??? Hearing loss Mother    ??? No Known Problems Father    ??? Pompe disease Maternal Grandfather    ??? Hearing loss Brother    ??? Hearing loss Maternal Grandmother    ??? Anesthesia problems Neg Hx        Social History     Social History     Socioeconomic History   ??? Marital status: Married     Spouse name: Not on file   ??? Number of children: Not on file   ??? Years of education: Not on file   ??? Highest education level: Not on file   Occupational History   ??? Not on file   Tobacco Use   ??? Smoking status: Never Smoker   ??? Smokeless tobacco: Never Used   Vaping Use   ??? Vaping Use: Never used   Substance and Sexual Activity   ??? Alcohol use: No    ??? Drug use: No   ??? Sexual activity: Yes     Partners: Male     Birth control/protection: None   Other Topics Concern   ??? Caffeine Use Yes   ??? Occupational Exposure Not Asked   ??? Exercise Yes   ??? Seat Belt Yes   Social History Narrative   ??? Not on file     Social Determinants of Health     Financial Resource Strain: Not on file   Physical Activity: Not on file   Stress: Not on file   Social Connections: Not on file   Housing Stability: Not on file       Medications     Allergies:  Allergies   Allergen  Reactions   ??? Latex Rash     Other reaction(s): Rash, urticarial  hives  hives     ??? Penicillins Rash   ??? 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       Home Meds:  Prior to Admission medications as of 01/23/21 0913   Medication Sig Taking?   azaTHIOprine (IMURAN) 50 mg tablet Take 2 tablets (100 mg total) by mouth daily.    bromocriptine (PARLODEL) 2.5 mg tablet Take 2.5 mg by mouth daily.    ergocalciferol (VITAMIN D2) 1,250 mcg (50,000 unit) capsule Take 50,000 Units by mouth once a week. Patient taking twice a week    hydroCHLOROthiazide (HYDRODIURIL) 25 MG tablet Take 25 mg by mouth daily.    metFORMIN (GLUCOPHAGE-XR) 500 MG 24 hr tablet 2 TAB PO BID WITH MEALS Indications: polycystic ovarian syndrome    prenatal vit,cal 74/iron/folic (PRENATAL VITAMIN 1+1 ORAL) Take by mouth.        Inpatient Meds:  Scheduled:   Continuous:   ??? sodium chloride 0.9 %         PRN:     Vital Signs     Wt Readings from Last 3 Encounters:   01/23/21 (!) 239 lb 9.6 oz (108.7 kg)   07/25/20 (!) 238 lb 12.8 oz (108.3 kg)   06/05/20 (!) 241 lb (109.3 kg)     Ht Readings from Last 3 Encounters:   01/23/21 5' 6 (1.676 m)   07/25/20 5' 6 (1.676 m)   06/05/20 5' 6 (1.676 m)     Temp Readings from Last 3 Encounters:   01/28/20 98.8 ??F (37.1 ??C) (Oral)   07/09/19 98.7 ??F (37.1 ??C) (Oral)   07/17/18 98.4 ??F (36.9 ??C) (Oral)     BP Readings from Last 3 Encounters:   01/23/21 117/77    07/25/20 110/75   06/05/20 112/70     Pulse Readings from Last 3 Encounters:   01/23/21 88   07/25/20 84   01/28/20 94     @LASTSAO2 (3)@    Physical Exam     Airway:     Mallampati: II  Mouth Opening: >2 FB  TM distance: > = 3 FB  Neck ROM: full    Dental:        Pulmonary:        Cardiovascular:     Rhythm: regular  Rate: normal    Neuro/Musculoskeletal/Psych:    Mental status: alert and oriented to person, place and time.          Abdominal:     Obese.    Current OB Status:       Other Findings:        Laboratory Data     Lab Results   Component Value Date    WBC 2.2 12/16/2020    HGB 12.2 12/16/2020    HCT 35.2 (A) 12/16/2020    MCV 103.9 12/16/2020    PLT 121 (L) 04/16/2020       No results found for: [facility]    Lab Results   Component Value Date    GLUCOSE 87 12/16/2020    BUN 13 12/16/2020    CO2 30 (A) 12/16/2020    CREATININE 0.60 12/16/2020    K 3.6 12/16/2020    NA 139 12/16/2020    CL 102 12/16/2020    CALCIUM 9.1 12/16/2020    ALBUMIN 3.9 12/16/2020    PROT 7.3 04/16/2020  ALKPHOS 87 12/16/2020    ALT 45 12/16/2020    AST 50 12/16/2020    BILITOT 1.1 12/16/2020       Lab Results   Component Value Date    INR 1.01 12/16/2020       Lab Results   Component Value Date    PREGTESTUR Negative 02/05/2021       Anesthesia Plan     ASA 2         Female and current non-smoker    Anesthesia Type:  MAC.      PONV Risk Factors: female, current non-smoker,                  (Patient states extreme awareness with the last two endoscopy procedures.  Drugs utilized not known by myself at this time.  I explained our goal of unawareness but must keep up with adequate voluntary ventilation.)  Anesthetic plan and risks discussed with patient.              Plan discussed with CRNA.

## 2021-02-05 NOTE — Unmapped (Signed)
H&P reviewed, patient examined, no changes to H&P.    Neave Lenger, MD  GI & Hepatology staff  Pgr: [phone]

## 2021-02-05 NOTE — Unmapped (Signed)
Anesthesia Post Note    Patient: Tracy Watkins    Procedure(s) Performed: Procedure(s):  ESOPHAGOGASTRODUODENOSCOPY WITH MAC  EGD WITH BIOPSY    Anesthesia type: MAC    Patient location: Same Day Surgery    Airway: Patent    Post pain: Adequate analgesia    Nausea / Vomiting: Absent    Post-operative Hydration Status: Adequate    Post assessment: no apparent anesthetic complications    Last Vitals:   Vitals:    02/05/21 1202 02/05/21 1313 02/05/21 1315 02/05/21 1330   BP: 112/68 119/81 119/81 124/72   BP Location:  Left arm     Patient Position:  Lying     Pulse: 86 92 98 82   Resp: 14 22 16 24    Temp:  97.4 ??F (36.3 ??C)     TempSrc:  Temporal     SpO2: 100% 97% 97% 97%   Weight:       Height:            Post vital signs: stable    Level of consciousness: awake and alert     Complications:  There were no known complications for this encounter.

## 2021-02-05 NOTE — Unmapped (Signed)
Esophagogastroduodenoscopy, Care After  Refer to this sheet in the next few weeks. These instructions provide you with information about caring for yourself after your procedure. Your health care provider may also give you more specific instructions. Your treatment has been planned according to current medical practices, but problems sometimes occur. Call your health care provider if you have any problems or questions after your procedure.  What can I expect after the procedure?  After the procedure, it is common to have:  A sore throat.  Nausea.  Bloating.  Dizziness.  Fatigue.  Follow these instructions at home:  Do not eat or drink anything until the numbing medicine (local anesthetic) has worn off and your gag reflex has returned. You will know that the local anesthetic has worn off when you can swallow comfortably.  Do not drive for 24 hours if you received a medicine to help you relax (sedative).  If your health care provider took a tissue sample for testing during the procedure, make sure to get your test results. This is your responsibility. Ask your health care provider or the department performing the test when your results will be ready.  Keep all follow-up visits as told by your health care provider. This is important.  Contact a health care provider if:  You cannot stop coughing.  You are not urinating.  You are urinating less than usual.  Get help right away if:  You have trouble swallowing.  You cannot eat or drink.  You have throat or chest pain that gets worse.  You are dizzy or light-headed.  You faint.  You have nausea or vomiting.  You have chills.  You have a fever.  You have severe abdominal pain.  You have black, tarry, or bloody stools.  This information is not intended to replace advice given to you by your health care provider. Make sure you discuss any questions you have with your health care provider.  Document Released: 08/16/2012 Document Revised: 02/05/2016 Document Reviewed:  07/24/2015  Elsevier Interactive Patient Education ?? 2018 [place]

## 2021-02-05 NOTE — Unmapped (Signed)
Anesthesia Transfer of Care Note    Patient: Tracy Watkins  Procedure(s) Performed: Procedure(s):  ESOPHAGOGASTRODUODENOSCOPY WITH MAC  EGD WITH BIOPSY    Patient location: Same Day Surgery    Anesthesia type: MAC    Airway Device on Arrival to PACU/ICU: Room Air    IV Access: Peripheral    Monitors Recommended to be Used During PACU/ICU: Standard Monitors    Outstanding Issues to Address: None    Level of Consciousness: awake and alert     Post vital signs:    Vitals:    02/05/21    BP: 119/78   Pulse: 86   Resp: 14   Temp: 98   SpO2: 100%       Complications:  No complications documented.

## 2021-02-05 NOTE — Unmapped (Signed)
Endoscopy  Post Procedure Briefing Note: Tracy Watkins      Specimens:   Specimens     ID Source Type Tests Collected By Collected At Frozen? Attributes Order ID Breast Spec Formalin Marked as Sent    A Stomach Tissue ?? SURGICAL PATHOLOGY EXAM   Wallene Dales, MD 02/05/21 1303 No Sent in Formalin ?? [id number]        Comment: R/o H-pylori          Prior to leaving the room the nurse confirmed procedure, specimen labeling, documentation of deployables, any equipment issues.  Are there any recovery management concerns or post procedure orders?   No      Other Comments:     Signed: Candace Gallus [name]    Date: 02/05/2021    Time: 1:05 PM

## 2021-02-05 NOTE — Unmapped (Signed)
1311: Pt received from ENDO.  VSS. Assessment complete.  Pain score: denies pain. Family at the bedside.     1315: MD spoke with family and pt. Pt started on po intake.     1345: Patient and family verbalized understanding of discharge instructions.    1350: Pt assisted up OOB and to the BR. Gait steady.        Pt. Discharged per wheelchair to exit. Discharge Pain level- Pt states no pain.           Vitals:    02/05/21 1330   BP: 124/72   Pulse: 82   Resp: 24   Temp:    SpO2: 97%             I/O this shift:  In: 620 [P.O.:120; I.V.:500]  Out: -       Niko Penson PERKINS BANKS

## 2021-02-05 NOTE — Unmapped (Signed)
[facility]  GI   _______________________________________________________________________________  Patient Name: Tracy Watkins         Procedure Date: 02/05/2021 12:50 PM  MRN: [id number]                         Date of Birth: [birth year]  Attending MD: Wallene Dales , MD      Account Number: [id number]  Order #:                                _______________________________________________________________________________     Procedure:             Upper GI endoscopy  Indications:           Cirrhosis rule out esophageal varices  Referring MD:            Medicines:             Monitored Anesthesia Care  Complications:         No immediate complications.  _______________________________________________________________________________  Procedure:             Pre-Anesthesia Assessment:                         - Prior to the procedure, a History and Physical was                          performed, and patient medications and allergies were                          reviewed. The patient is competent. The risks and                          benefits of the procedure and the sedation options and                          risks were discussed with the patient. All questions                          were answered and informed consent was obtained.                          Patient identification and proposed procedure were                          verified by the physician, the nurse and the                          anesthesiologist in the pre-procedure area in the                          procedure room in the endoscopy suite. Mental Status                          Examination: alert and oriented. Airway Examination:                          normal oropharyngeal airway and neck mobility.  Respiratory Examination: clear to auscultation. CV                          Examination: normal. Prophylactic Antibiotics: The                          patient does not require prophylactic antibiotics.                           Prior Anticoagulants: The patient has taken no                          anticoagulant or antiplatelet agents. ASA Grade                          Assessment: II - A patient with mild systemic disease.                          After reviewing the risks and benefits, the patient                          was deemed in satisfactory condition to undergo the                          procedure. The anesthesia plan was to use monitored                          anesthesia care (MAC). Immediately prior to                          administration of medications, the patient was                          re-assessed for adequacy to receive sedatives. The                          heart rate, respiratory rate, oxygen saturations,                          blood pressure, adequacy of pulmonary ventilation, and                          response to care were monitored throughout the                          procedure. The physical status of the patient was                          re-assessed after the procedure.                         After obtaining informed consent, the endoscope was                          passed under direct vision. Throughout the procedure,  the patient's blood pressure, pulse, and oxygen                          saturations were monitored continuously. The Endoscope                          was introduced through the mouth, and advanced to the                          second part of duodenum. The upper GI endoscopy was                          accomplished without difficulty. The patient tolerated                          the procedure well.                                                                                   Findings:       Small (< 5 mm) varices were found in the lower third of the esophagus.       Localized mild inflammation characterized by erosions and erythema was        found in the gastric antrum. Biopsies were taken with a cold forceps for         histology. Estimated blood loss: none.       Mild portal hypertensive gastropathy was found in the gastric body.       The examined duodenum was normal.                                                                                   Estimated Blood Loss:       Estimated blood loss: none.  Impression:            - Small (< 5 mm) esophageal varices.                         - Gastritis. Biopsied.                         - Portal hypertensive gastropathy.                         - Normal examined duodenum.  Recommendation:        - Discharge patient to home (ambulatory).                         - Await pathology results.                         -  Repeat upper endoscopy in 1-2 years for surveillance.                                                                                   Procedure Code(s):     --- Professional ---                         [phone], Esophagogastroduodenoscopy, flexible,                          transoral; with biopsy, single or multiple  Diagnosis Code(s):     --- Professional ---                         K74.60, Unspecified cirrhosis of liver                         K76.6, Portal hypertension                         K29.70, Gastritis, unspecified, without bleeding    CPT copyright 2020 [employer]. All rights reserved.    The codes documented in this report are preliminary and upon coder review may   be revised to meet current compliance requirements.    Wallene Dales,  MD  _________________  Wallene Dales, MD  02/05/2021 1:10:10 PM  This report has been signed electronically.  Number of Addenda: 0    Note Initiated On: 02/05/2021 12:50 PM  Total Procedure Duration Time 0 hours 4 minutes 28 seconds   Scope In: 1:00:03 PM  Scope Out: 1:04:31 PM

## 2021-02-10 MED ORDER — nirmatrelvir - ritonavir (PAXLOVID) 150 mg x 2- 100 mg Tab
300 | Freq: Two times a day (BID) | ORAL | 0 refills | 5.00000 days | Status: AC
Start: 2021-02-10 — End: 2021-04-07

## 2021-02-10 MED ORDER — nirmatrelvir - ritonavir (PAXLOVID) 150 mg x 2- 100 mg Tab
300 | Freq: Two times a day (BID) | ORAL | 0 refills | 5.00000 days | Status: AC
Start: 2021-02-10 — End: 2021-02-10

## 2021-02-10 NOTE — Unmapped (Signed)
Pharmacy called in and needs clarification on script.    [facility] [place], [place] - [address]  [address] [place] [address]  Phone: [phone]

## 2021-02-10 NOTE — Unmapped (Signed)
Pt's husband called back in to provide pharmacy that he would like RX for Paxlovid sent to:     [facility] [place], [place] - [address]  [address] [place] [address]  Phone: [phone]

## 2021-02-10 NOTE — Unmapped (Signed)
RN returned call. Patient presented with symptoms of fatigue, tightness in chest, cough, and dizziness late Thursday 02/05/21 and Friday and progressively got worse so went to ER and has tested positive for COVID. Patient has appt with PCP today 02/10/21 @ 2:20 pm. ER had mentioned to patient about monoclonal antibody infusion therapy Pt and spouse are aware will reach out to Dr Boykin Nearing and will return call with plan once he responds

## 2021-02-10 NOTE — Unmapped (Signed)
RN returned call and spoke with Herbert Seta pharmacist to inform that has been filled at [facility] per PCP so can discontinue fill through Their pharmacy. She thanked Charity fundraiser for update.

## 2021-02-10 NOTE — Unmapped (Signed)
RN Called pt and spouse and relayed the following information as directed per Dr Boykin Nearing. Aware this was sent to [facility] pharmacy. There are to call office if there are any further questions about dosing. Will take for 5 days as prescribed.

## 2021-02-10 NOTE — Unmapped (Signed)
Monoclonal Ab infusion at [facility] is restricted for pregnant females only. I have sent a prescription for Paxlovid (oral antiviral medication) to her [name]. She can start this

## 2021-02-10 NOTE — Unmapped (Signed)
Pt husband called regarding prescription sent for nirmatrelvir - ritonavir (PAXLOVID) 150 mg x 2- 100 mg Tab     Tracy Watkins advised that their pharmacy does not carry and he is not sure what pharmacy would have the medication.     I advised I am not sure mD would know who would actually have it in stock and I suggested to call another local pharmacy near him to find out if they have medication and call us back on where to send.    Tracy Watkins stated he would do that

## 2021-02-10 NOTE — Unmapped (Signed)
Spoke to patient. She had mild sore throat on May 26 after her EGD. Sore throat after her EGD is expected due to procedure. Her COVID symptoms started on May 29 with worsening sore throat, fever and muscle aches and wheezing. Yesterday was the worst day and today is better. She has received Paxlovid medication through her PCP and took the first dose just now. Advised her to continue the full dose

## 2021-02-10 NOTE — Unmapped (Signed)
Pt's spouse called in and stated patient tested positive for covid after EGD and request a call back from MD with recommendations.     Pt and spouse can be reached at [phone].

## 2021-02-12 NOTE — Unmapped (Signed)
-----   Message from Shary Decamp, RN sent at 02/11/2021  4:24 PM EDT -----  Tracy Watkins,    As we were just discussing per Dr. Berenda Morale EGD Gastric biopsy showed mild gastritis and no h.pylori    Thanks  Amy    ----- Message -----  From: Isabella Bowens, MA  Sent: 02/11/2021   3:15 PM EDT  To: Shary Decamp, RN    I dont do anything with EGD results     ----- Message -----  From: Shary Decamp, RN  Sent: 02/11/2021   1:36 PM EDT  To: Isabella Bowens, MA      ----- Message -----  From: Wallene Dales, MD  Sent: 02/11/2021  12:43 PM EDT  To: Shary Decamp, RN, Tillman Abide, MD    Gastric biopsy showed mild gastritis and no h.pylori      ----- Message -----  From: Leory Plowman, [facility]  Sent: 02/11/2021  12:35 PM EDT  To: Wallene Dales, MD

## 2021-02-20 NOTE — Unmapped (Signed)
Pt called stating her PCP needs OV notes from 5/13 to be able to send a referral to Rheumatology. Please fax notes to [phone]    Dr. Laurell Josephs office phone number: [phone]

## 2021-04-06 NOTE — Unmapped (Signed)
Olivia and I discussed this case when she called me.  Due to the yellowing of the eyes I agreed with her that she should present to the ED.

## 2021-04-06 NOTE — Unmapped (Signed)
Per NT protols, patient advised to go to the ED/Call 911 at this time. Patient/caregiver verbalizes understanding.    Pt calling to report symptoms that started after current IVF cycle.   +'Complete yellowing of the eyes   +Fatigue  +Bloating   +Low blood sugar events    Pt reports ankle and feet swelling over the weekend that has since subsided.     Pt denies any CP, SOB, fever, changes in bowl habits, weight gain, or diet changes.     Pt advised to seek the ER for evaluation. Pt voiced understanding.     Reason for Disposition  ??? Nursing judgment    Answer Assessment - Initial Assessment Questions  1. REASON FOR CALL: What is your main concern right now?      increase in yellowing of the eyes    2. ONSET: When did the *No Answer* start?      2.5 weeks ago after starting IVF cycle     3. SEVERITY: How bad is the *No Answer*?      moderate    4. FEVER: Do you have a fever?      Denies     5. OTHER SYMPTOMS: Do you have any other new symptoms?      Fatigue, bloating and low blood sugar attacks    6. INTERVENTIONS AND RESPONSE: What have you done so far to try to make this better? What medications have you used?      Nothing     7. PREGNANCY: Is there any chance you are pregnant?      Currently    Protocols used: NO PROTOCOL AVAILABLE - SICK ADULT-[facility]

## 2021-04-07 ENCOUNTER — Inpatient Hospital Stay: Admit: 2021-04-07 | Payer: TRICARE (CHAMPUS)

## 2021-04-07 ENCOUNTER — Emergency Department: Payer: TRICARE (CHAMPUS)

## 2021-04-07 ENCOUNTER — Emergency Department: Admit: 2021-04-07 | Payer: TRICARE (CHAMPUS)

## 2021-04-07 ENCOUNTER — Inpatient Hospital Stay
Admission: EM | Admit: 2021-04-07 | Discharge: 2021-04-15 | Disposition: A | Discharge status: Home / Self Care | Payer: TRICARE (CHAMPUS) | Admitting: Gastroenterology

## 2021-04-07 DIAGNOSIS — K7200 Acute and subacute hepatic failure without coma: Secondary | ICD-10-CM

## 2021-04-07 LAB — URINALYSIS-MACROSCOPIC W/REFLEX TO MICROSCOPIC
Blood, UA: NEGATIVE
Glucose, UA: 100 mg/dL
Leukocytes, UA: NEGATIVE
Nitrite, UA: NEGATIVE
Specific Gravity, UA: 1.025 (ref 1.005–1.035)
Urobilinogen, UA: 2 EU/dL (ref 0.2–1.0)
pH, UA: 5.5 (ref 5.0–8.0)

## 2021-04-07 LAB — IGG: IgG: 1949 mg/dL — ABNORMAL HIGH (ref 700.0–1600.0)

## 2021-04-07 LAB — HEPATIC FUNCTION PANEL
ALT: 463 U/L (ref 7–52)
AST: 623 U/L (ref 13–39)
Albumin: 3 g/dL (ref 3.5–5.7)
Alkaline Phosphatase: 106 U/L (ref 36–125)
Bilirubin, Direct: 7.24 mg/dL (ref 0.00–0.40)
Total Bilirubin: 13.8 mg/dL (ref 0.0–1.5)
Total Protein: 6.4 g/dL (ref 6.4–8.9)

## 2021-04-07 LAB — HERPES SIMPLEX VIRUS 1 & 2 BLOOD, REAL TIME PCR
HSV 1, PCR: NOT DETECTED
HSV 2 , PCR: NOT DETECTED

## 2021-04-07 LAB — CBC
Hematocrit: 30.2 % (ref 35.0–45.0)
Hemoglobin: 10.9 g/dL (ref 11.7–15.5)
MCH: 39 pg (ref 27.0–33.0)
MCHC: 36.1 g/dL (ref 32.0–36.0)
MCV: 108.1 fL (ref 80.0–100.0)
MPV: 10.8 fL (ref 7.5–11.5)
Platelet Estimate: ADEQUATE
Platelets: 195 10*3/uL (ref 140–400)
RBC: 2.79 10*6/uL (ref 3.80–5.10)
RDW: 22.7 % (ref 11.0–15.0)
WBC: 1.4 10*3/uL (ref 3.8–10.8)

## 2021-04-07 LAB — 2019 NOVEL CORONAVIRUS (COVID-19), NAA-B: SARS-CoV-2: NOT DETECTED

## 2021-04-07 LAB — PROTIME-INR
INR: 1.7 (ref 0.9–1.1)
INR: 1.7 — ABNORMAL HIGH (ref 0.9–1.1)
Protime: 20.7 seconds (ref 12.1–15.1)
Protime: 20.5 s — ABNORMAL HIGH (ref 12.1–15.1)

## 2021-04-07 LAB — COMPREHENSIVE METABOLIC PANEL
ALT: 414 U/L (ref 7–52)
AST: 461 U/L (ref 13–39)
Albumin: 2.9 g/dL — ABNORMAL LOW (ref 3.5–5.7)
Alkaline Phosphatase: 96 U/L (ref 36–125)
Anion Gap: 8 mmol/L (ref 3–16)
BUN: 18 mg/dL (ref 7–25)
CO2: 24 mmol/L (ref 21–33)
Calcium: 7.9 mg/dL (ref 8.6–10.3)
Chloride: 100 mmol/L (ref 98–110)
Creatinine: 0.73 mg/dL (ref 0.60–1.30)
EGFR: >90
Glucose: 193 mg/dL (ref 70–100)
Osmolality, Calculated: 281 mosm/kg (ref 278–305)
Potassium: 4.1 mmol/L (ref 3.5–5.3)
Sodium: 132 mmol/L — ABNORMAL LOW (ref 133–146)
Total Bilirubin: 13.9 mg/dL (ref 0.0–1.5)
Total Protein: 6.3 g/dL — ABNORMAL LOW (ref 6.4–8.9)

## 2021-04-07 LAB — HIGH SENSITIVITY TROPONIN
High Sensitivity Troponin: 3 ng/L (ref 0–14)
High Sensitivity Troponin: 3 ng/L (ref 0–14)

## 2021-04-07 LAB — DIFFERENTIAL
Basophils Absolute: 3 /uL (ref 0–200)
Basophils Relative: 0.2 % (ref 0.0–1.0)
Eosinophils Absolute: 15 /uL (ref 15–500)
Eosinophils Relative: 1.1 % (ref 0.0–8.0)
Lymphocytes Absolute: 140 /uL (ref 850–3900)
Lymphocytes Relative: 10 % (ref 15.0–45.0)
Monocytes Absolute: 25 /uL (ref 200–950)
Monocytes Relative: 1.8 % (ref 0.0–12.0)
Neutrophils Absolute: 1217 /uL (ref 1500–7800)
Neutrophils Relative: 86.9 % (ref 40.0–80.0)
PLT Morphology: NORMAL
nRBC: 0 /100 WBC (ref 0–0)

## 2021-04-07 LAB — BC RAPID MOLECULAR ID - GRAM POSITIVE PANEL
Bacillus cereus group: NOT DETECTED
Bacillus subtilis group: NOT DETECTED
Corynebacterium: NOT DETECTED
Cutibacterium (propionibacterium) acnes: NOT DETECTED
Enterococcus faecalis: NOT DETECTED
Enterococcus faecium: NOT DETECTED
Enterococcus species: NOT DETECTED
Lactobacillus species: NOT DETECTED
Listeria monocytogenes: NOT DETECTED
Listeria species: NOT DETECTED
Micrococcus species: DETECTED
Pan Candida: NOT DETECTED
Pan Gram Neg: NOT DETECTED
Staphylococcus aureus: NOT DETECTED
Staphylococcus epidermidis: NOT DETECTED
Staphylococcus lugdunensis: NOT DETECTED
Staphylococcus: NOT DETECTED
Streptococcus agalactiae: NOT DETECTED
Streptococcus pneumoniae, presumptive identification: NOT DETECTED
Streptococcus pyogenes: NOT DETECTED
Streptococcus species: NOT DETECTED
Streptocococcus anginosus group: NOT DETECTED

## 2021-04-07 LAB — BASIC METABOLIC PANEL
BUN: 14 mg/dL (ref 7–25)
CO2: 23 mmol/L (ref 21–33)
Calcium: 8.6 mg/dL (ref 8.6–10.3)
Chloride: 102 mmol/L (ref 98–110)
Creatinine: 0.66 mg/dL (ref 0.60–1.30)
EGFR: >90
Glucose: 102 mg/dL (ref 70–100)
Osmolality, Calculated: 283 mOsm/kg (ref 278–305)
Potassium: 3.5 mmol/L (ref 3.5–5.3)
Sodium: 136 mmol/L (ref 133–146)

## 2021-04-07 LAB — PREGNANCY QUALITATIVE, SERUM, UCMC ONLY: Preg, Serum: POSITIVE

## 2021-04-07 LAB — B NATRIURETIC PEPTIDE: BNP: 139 pg/mL (ref 0–100)

## 2021-04-07 LAB — HEPATITIS C ANTIBODY: HCV Ab: NONREACTIVE

## 2021-04-07 LAB — URINE DRUG SCREEN WITHOUT CONFIRMATION, STAT
Amphetamine, 500 ng/mL Cutoff: NEGATIVE
Barbiturates UR, 300  ng/mL Cutoff: NEGATIVE
Benzodiazepines UR, 300 ng/mL Cutoff: NEGATIVE
Buprenorphine, 5 ng/mL Cutoff: NEGATIVE
Cocaine UR, 300 ng/mL Cutoff: NEGATIVE
Fentanyl, 2 ng/mL Cutoff: NEGATIVE
Methadone, UR, 300 ng/mL Cutoff: NEGATIVE
Opiates UR, 300 ng/mL Cutoff: POSITIVE — AB
Oxycodone, 100 ng/mL Cutoff: NEGATIVE
THC UR, 50 ng/mL Cutoff: NEGATIVE
Tricyclic Antidepressants, 300 ng/mL Cutoff: NEGATIVE

## 2021-04-07 LAB — URINALYSIS, MICROSCOPIC
RBC, UA: <1 /HPF (ref 0–3)
Squam Epithel, UA: 2 /HPF (ref 0–5)
WBC, UA: 2 /HPF (ref 0–5)

## 2021-04-07 LAB — BLOOD CULTURE-PERIPHERAL: Culture Result: NO GROWTH

## 2021-04-07 LAB — CYTOMEGALOVIRUS DNA, QUANT, RT PCR: CMV DNA Qnt: NOT DETECTED [IU]/mL

## 2021-04-07 LAB — HEPATITIS B SURFACE ANTIGEN: Hep B Surface Ag: NONREACTIVE

## 2021-04-07 LAB — HEPATITIS E IGM: Hepatitis E IgM: NEGATIVE

## 2021-04-07 LAB — LIPASE: Lipase: 72 U/L (ref 4–82)

## 2021-04-07 LAB — LACTIC ACID, VENOUS, WHOLE BLOOD, UCMC
Lactate, Ven: 5.8 mmol/L (ref 0.5–2.2)
Lactate, Ven: 5.6 mmol/L (ref 0.5–2.2)

## 2021-04-07 LAB — HEPATITIS B SURFACE ANTIBODY, QUANTITATIVE
HBSAB NUMBER: 24.3 m[IU]/mL (ref 0.00–9.99)
Hep B S Ab: REACTIVE

## 2021-04-07 LAB — EPSTEIN BARR VIRUS DNA, QNT PCR, BLOOD: EBV DNA, Quantitative PCR: NOT DETECTED [IU]/mL

## 2021-04-07 LAB — ACETAMINOPHEN LEVEL: Acetaminophen Level: <10 ug/mL (ref 10–30)

## 2021-04-07 LAB — HEPATITIS B CORE ANTIBODY: Hep B Core Total Ab: NONREACTIVE

## 2021-04-07 LAB — HEPATITIS A IGM: Hep A IgM: NONREACTIVE

## 2021-04-07 MED ORDER — oxyCODONE (ROXICODONE) immediate release tablet 10 mg
5 | ORAL | Status: AC | PRN
Start: 2021-04-07 — End: 2021-04-10
  Administered 2021-04-08 – 2021-04-10 (×8): 10 mg via ORAL

## 2021-04-07 MED ORDER — HYDROmorphone (DILAUDID) injection Syrg 0.5 mg
0.5 | Freq: Once | INTRAMUSCULAR | Status: AC
Start: 2021-04-07 — End: 2021-04-07
  Administered 2021-04-07: 15:00:00 0.5 mg via INTRAVENOUS

## 2021-04-07 MED ORDER — heparin (porcine) injection 5,000 Units
5000 | Freq: Three times a day (TID) | INTRAMUSCULAR | Status: AC
Start: 2021-04-07 — End: 2021-04-15
  Administered 2021-04-07 – 2021-04-15 (×17): 5000 [IU] via SUBCUTANEOUS

## 2021-04-07 MED ORDER — electrolyte-R (pH 7.4) (NORMOSOL-R pH 7.4) iv solution SolP
Freq: Once | INTRAVENOUS | Status: AC
Start: 2021-04-07 — End: 2021-04-07
  Administered 2021-04-07: 12:00:00 1000 mL/h via INTRAVENOUS

## 2021-04-07 MED ORDER — predniSONE (DELTASONE) tablet 40 mg
20 | Freq: Every day | ORAL | Status: AC
Start: 2021-04-07 — End: 2021-04-10
  Administered 2021-04-08 – 2021-04-10 (×3): 40 mg via ORAL

## 2021-04-07 MED ORDER — HYDROmorphone (DILAUDID) injection Syrg 0.5 mg
0.5 | Freq: Once | INTRAMUSCULAR | Status: AC
Start: 2021-04-07 — End: 2021-04-07
  Administered 2021-04-07: 07:00:00 0.5 mg via INTRAVENOUS

## 2021-04-07 MED ORDER — ceFEPIme (MAXIPIME) in dextrose,iso-osm 100 mL IVPB 2 g
2 | Freq: Once | INTRAVENOUS | Status: AC
Start: 2021-04-07 — End: 2021-04-07
  Administered 2021-04-07: 07:00:00 2 g via INTRAVENOUS

## 2021-04-07 MED ORDER — vancomycin (VANCOCIN) 2,500 mg in sodium chloride 0.9 % 500 mL IVPB
Freq: Once | INTRAVENOUS | Status: AC
Start: 2021-04-07 — End: 2021-04-07
  Administered 2021-04-07: 08:00:00 2500 mg/kg via INTRAVENOUS

## 2021-04-07 MED ORDER — sodium chloride flush 10 mL
INTRAMUSCULAR | Status: AC
Start: 2021-04-07 — End: 2021-04-15
  Administered 2021-04-07 – 2021-04-15 (×18): 10 mL via INTRAVENOUS

## 2021-04-07 MED ORDER — ondansetron (ZOFRAN) injection 4 mg
4 | Freq: Four times a day (QID) | INTRAMUSCULAR | Status: AC | PRN
Start: 2021-04-07 — End: 2021-04-15
  Administered 2021-04-10 – 2021-04-13 (×5): 4 mg via INTRAVENOUS

## 2021-04-07 MED ORDER — PNV,calcium 72-iron-folic acid (PRENATAL PLUS) 27 mg iron- 1 mg tablet Tab 1 tablet
27 | Freq: Every day | ORAL | Status: AC
Start: 2021-04-07 — End: 2021-04-15
  Administered 2021-04-08 – 2021-04-15 (×8): 1 via ORAL

## 2021-04-07 MED ORDER — oxyCODONE (ROXICODONE) immediate release tablet 5 mg
5 | ORAL | Status: AC | PRN
Start: 2021-04-07 — End: 2021-04-10
  Administered 2021-04-08 – 2021-04-09 (×2): 5 mg via ORAL

## 2021-04-07 MED ORDER — ondansetron (ZOFRAN) injection 8 mg
4 | Freq: Once | INTRAMUSCULAR | Status: AC
Start: 2021-04-07 — End: 2021-04-07
  Administered 2021-04-07: 13:00:00 8 mg via INTRAVENOUS

## 2021-04-07 MED ORDER — oxyCODONE (ROXICODONE) immediate release tablet 5 mg
5 | Freq: Four times a day (QID) | ORAL | Status: AC | PRN
Start: 2021-04-07 — End: 2021-04-07

## 2021-04-07 MED ORDER — HYDROmorphone (DILAUDID) injection Syrg 0.5 mg
0.5 | Freq: Once | INTRAMUSCULAR | Status: AC
Start: 2021-04-07 — End: 2021-04-07
  Administered 2021-04-07: 12:00:00 0.5 mg via INTRAVENOUS

## 2021-04-07 MED ORDER — proMETHazine (PHENERGAN) injection 12.5 mg
25 | Freq: Four times a day (QID) | INTRAMUSCULAR | Status: AC | PRN
Start: 2021-04-07 — End: 2021-04-15
  Administered 2021-04-07 – 2021-04-14 (×8): 12.5 mg via INTRAVENOUS
  Administered 2021-04-14: 02:00:00 6.25 mg via INTRAVENOUS
  Administered 2021-04-15: 01:00:00 12.5 mg via INTRAVENOUS

## 2021-04-07 MED ORDER — sodium chloride 0.9 % infusion
INTRAVENOUS | Status: AC
Start: 2021-04-07 — End: 2021-04-08
  Administered 2021-04-07 – 2021-04-08 (×2): 75 mL/h via INTRAVENOUS

## 2021-04-07 MED ORDER — predniSONE (DELTASONE) tablet 40 mg
20 | Freq: Once | ORAL | Status: AC
Start: 2021-04-07 — End: 2021-04-07
  Administered 2021-04-07: 15:00:00 40 mg via ORAL

## 2021-04-07 MED ORDER — levothyroxine (SYNTHROID) tablet 112 mcg
112 | Freq: Every morning | ORAL | Status: AC
Start: 2021-04-07 — End: 2021-04-15
  Administered 2021-04-08 – 2021-04-15 (×8): 112 ug via ORAL

## 2021-04-07 MED ORDER — oxyCODONE (ROXICODONE) immediate release tablet 5 mg
5 | Freq: Once | ORAL | Status: AC
Start: 2021-04-07 — End: 2021-04-07
  Administered 2021-04-07: 19:00:00 5 mg via ORAL

## 2021-04-07 MED ORDER — electrolyte-R (pH 7.4) (NORMOSOL-R pH 7.4) 1,000 mL IV fluid
Freq: Once | INTRAVENOUS | Status: AC
Start: 2021-04-07 — End: 2021-04-07
  Administered 2021-04-07: 07:00:00 1000 mL via INTRAVENOUS

## 2021-04-07 MED FILL — ONDANSETRON HCL (PF) 4 MG/2 ML INJECTION SOLUTION: 4 4 mg/2 mL | INTRAMUSCULAR | Qty: 4

## 2021-04-07 MED FILL — HEPARIN (PORCINE) 5,000 UNIT/ML INJECTION SOLUTION: 5000 5,000 unit/mL | INTRAMUSCULAR | Qty: 1

## 2021-04-07 MED FILL — PROMETHAZINE 25 MG/ML INJECTION SOLUTION: 25 25 mg/mL | INTRAMUSCULAR | Qty: 1

## 2021-04-07 MED FILL — HYDROMORPHONE 0.5 MG/0.5 ML INJECTION SYRINGE: 0.5 0.5 mg/0.5 mL | INTRAMUSCULAR | Qty: 0.5

## 2021-04-07 MED FILL — PRENATAL PLUS (CALCIUM CARBONATE) 27 MG IRON-1 MG TABLET: 27 27 mg iron- 1 mg | ORAL | Qty: 1

## 2021-04-07 MED FILL — PREDNISONE 20 MG TABLET: 20 20 MG | ORAL | Qty: 2

## 2021-04-07 MED FILL — CEFEPIME 2 GRAM/100 ML IN DEXTROSE (ISO-OSMOTIC) INTRAVENOUS PIGGYBACK: 2 2 gram/100 mL | INTRAVENOUS | Qty: 100

## 2021-04-07 MED FILL — LEVOTHYROXINE 112 MCG TABLET: 112 112 MCG | ORAL | Qty: 1

## 2021-04-07 MED FILL — VANCOMYCIN 1,000 MG INTRAVENOUS INJECTION: 1000 1000 mg | INTRAVENOUS | Qty: 2500

## 2021-04-07 MED FILL — OXYCODONE 5 MG TABLET: 5 5 MG | ORAL | Qty: 1

## 2021-04-07 NOTE — Unmapped (Signed)
ED Attending Attestation Note    Date of service:  04/07/2021    This patient was seen by the resident physician.  I have seen and examined the patient, agree with the workup, evaluation, management and diagnosis. The care plan has been discussed and I concur.  I have reviewed the ECG and concur with the resident's interpretation.    My assessment reveals a 25 y.o. female with a history of autoimmune hepatitis who states that she recently finished a round of IVF.  She has noticed that for the last 10 days or so, she has had a change in the color of her skin and eyes.  She did become jaundiced.  She also complains of some right-sided abdominal pain, nausea and worsened left greater than right peripheral edema.  She states her urine has been dark in color.  She is also had some midsternal nonradiating pressure in her chest which is pleuritic and associated with some mild shortness of breath.  She states that she has been taking Tylenol as directed.  On exam, she has scleral icterus.  She does have epigastric, right upper quadrant and right lower quadrant tenderness.  Pulse oximeter is 100%.  Resting tachycardia at 120.  White count 1.4.  Hemoglobin 10.9.  Acetaminophen undetectable.  Lactate 5.6.  Lipase 72.  Chest x-ray clear.  Lying supine in the bed without any dyspnea.  BNP 139.  INR 1.7.  Total bili is now 13.8 with LFTs in her 600 and 460.      CRITICAL CARE TIME    I have seen and examined this patient and provided 31 minutes of critical care time exclusive of separately billed procedures and treating other patients.  Critical care was necessary to treat or prevent imminent or life threatening deterioration of the following condition(s): hepatic failure    Critical care time was spent personally by me on the following activities: evaluation of patient's resonse to treatment, ordering and performing treatments and interventions, ordering and review of laboratory studies, ordering and review of radiographic  studies and re-evaluation of patient's condition

## 2021-04-07 NOTE — Unmapped (Signed)
Patient presents to the ED from OSH. Patient has autoimmune hepatitis. Patient presented to OSH ER for jaundice, belly pain, and lower extremity swelling. Patient is alert and oriented X4. Respirations are easy and unlabored

## 2021-04-07 NOTE — Unmapped (Signed)
Gynecology Consult      Subjective: Abdominal Pain  Tracy Watkins is a 25 y.o. G1P0010 PMHx Autoimmune Hepatitis, esophageal varices, PCOS who presents for 1.5 weeks history of worsening abdominal pain, jaundice, scleral icterus, and dark urine. Also with associated nausea, ascites, bl lower extremity edema, and pleuritic chest pain. Consulted for concern for OHSS. She has been following with [facility]. Recently underwent first IVF cycle. Had been taking Follistim and Novarel. She triggered on Wednesday 7/20 with Ovidrel, Leupron and Ganirelix for egg retrieval 7/22. Her symptoms have been present since early last week, prior to trigger, and have been steadily worsening.     She had been diagnosed with auto-immune hepatitis in 2017 and had previously been on the liver transplant list. She follows closely with hepatology at [facility]. She states that her symptoms feel similar to prior AIH symptoms, though the severe RLQ and LLQ pain is new.    With patient permission her REI Dr. Fayette Pho was contacted and reports patient underwent retrieval on 04/03/21 with 16 eggs retrieved. Procedure was reported to be uncomplicated.      Past Medical History:   Diagnosis Date   ??? Acne    ??? Amenorrhea    ??? 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 Bilateral leg edema    ??? Cirrhosis (CMS Dx)    ??? Dysmenorrhea    ??? Esophageal varices (CMS Dx) 05/2016    Grade 1/small, no history of bleeding   ??? H/O infertility    ??? PCOS (polycystic ovarian syndrome)      Past Surgical History:   Procedure Laterality Date   ??? ESOPHAGOGASTRODUODENOSCOPY N/A 02/05/2021    Procedure: ESOPHAGOGASTRODUODENOSCOPY WITH MAC;  Surgeon: Wallene Dales, MD;  Location: [facility] ENDOSCOPY;  Service: Gastroenterology;  Laterality: N/A;   ??? ESOPHAGOGASTRODUODENOSCOPY N/A 02/05/2021    Procedure: EGD WITH BIOPSY;  Surgeon: Wallene Dales, MD;  Location: [facility] ENDOSCOPY;  Service: Gastroenterology;  Laterality: N/A;   ??? LIVER BIOPSY     ??? UPPER GASTROINTESTINAL ENDOSCOPY          OB History   Gravida Para Term Preterm AB Living   1       1     SAB IAB Ectopic Multiple Live Births                  # Outcome Date GA Lbr Len/2nd Weight Sex Delivery Anes PTL Lv   1 AB 2017 [redacted]w[redacted]d    SAB          No current facility-administered medications on file prior to encounter.     Current Outpatient Medications on File Prior to Encounter   Medication Sig Dispense Refill   ??? azaTHIOprine (IMURAN) 50 mg tablet Take 2 tablets (100 mg total) by mouth daily. 60 tablet 6   ??? bromocriptine (PARLODEL) 2.5 mg tablet Take 2.5 mg by mouth daily.     ??? ergocalciferol (ERGOCALCIFEROL) 1,250 mcg (50,000 unit) capsule Take 50,000 Units by mouth once a week. Patient taking twice a week     ??? hydroCHLOROthiazide (HYDRODIURIL) 25 MG tablet Take 25 mg by mouth daily.     ??? levothyroxine (SYNTHROID) 112 MCG tablet Take 112 mcg by mouth every morning before breakfast.     ??? metFORMIN (GLUCOPHAGE-XR) 500 MG 24 hr tablet 2 TAB PO BID WITH MEALS Indications: polycystic ovarian syndrome 120 tablet 4   ??? nirmatrelvir - ritonavir (PAXLOVID) 150 mg x 2- 100 mg Tab Take 3 tablet combination (2 x 150  mg nirmatrevir tablets, 300 mg total) (1 x 100 mg ritonavir tablet) twice daily for 5 days. 5 Dose Pack 0   ??? prenatal vit,cal 74/iron/folic (PRENATAL VITAMIN 1+1 ORAL) Take by mouth.       Allergies   Allergen Reactions   ??? Latex Rash     Other reaction(s): Rash, urticarial  hives  hives     ??? Penicillins Rash   ??? 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       Social History     Tobacco Use   ??? Smoking status: Never Smoker   ??? Smokeless tobacco: Never Used   Vaping Use   ??? Vaping Use: Never used   Substance Use Topics   ??? Alcohol use: No   ??? Drug use: No     Family History   Problem Relation Age of Onset   ??? Hearing loss Mother    ??? No Known Problems Father    ??? Pompe disease Maternal Grandfather    ??? Hearing loss Brother    ??? Hearing loss Maternal Grandmother    ??? Anesthesia  problems Neg Hx        Objective:   Patient Vitals for the past 24 hrs:   BP Temp Temp src Pulse Resp SpO2 Weight   04/07/21 0630 -- -- -- 107 26 98 % --   04/07/21 0600 -- -- -- -- 23 100 % --   04/07/21 0530 -- -- -- 120 22 100 % --   04/07/21 0500 129/73 -- -- 103 23 98 % --   04/07/21 0430 -- -- -- 105 21 97 % --   04/07/21 0400 117/57 -- -- 109 21 100 % --   04/07/21 0330 116/67 -- -- 108 28 99 % --   04/07/21 0300 -- -- -- 109 21 100 % --   04/07/21 0230 -- -- -- 108 20 99 % --   04/07/21 0218 -- -- -- -- -- -- 220 lb (99.8 kg)   04/07/21 0200 126/63 -- -- 110 24 100 % --   04/07/21 0130 -- -- -- 108 28 99 % --   04/07/21 0105 132/77 -- -- 117 19 100 % --   04/07/21 0100 -- -- -- 113 21 100 % --   04/07/21 0057 -- 98.2 ??F (36.8 ??C) Oral 120 19 100 % --       Gen: Alert, oriented, jaundiced with scleral icterus, very uncomfortable  Resp: Increased WOB, no rales/crackles  CV: Tachycardic with normal rhythm  Abd: Generalized abdominal tenderness with increased discomfort to both superficial and deep palpation, particularly in RLQ and LLQ  Pelvic: Deferred  Ext: Bilateral lower extremity edema    Recent Results (from the past 24 hour(s))   Basic metabolic panel    Collection Time: 04/07/21  1:49 AM   Result Value Ref Range    Sodium 136 133 - 146 mmol/L    Potassium 3.5 3.5 - 5.3 mmol/L    Chloride 102 98 - 110 mmol/L    CO2 23 21 - 33 mmol/L    BUN 14 7 - 25 mg/dL    Creatinine 1.61 0.96 - 1.30 mg/dL    Glucose 045 (H) 70 - 100 mg/dL    Calcium 8.6 8.6 - 40.9 mg/dL    Osmolality, Calculated 283 278 - 305 mOsm/kg    EGFR >90    CBC    Collection Time: 04/07/21  1:49  AM   Result Value Ref Range    WBC 1.4 (L) 3.8 - 10.8 10E3/uL    RBC 2.79 (L) 3.80 - 5.10 10E6/uL    Hemoglobin 10.9 (L) 11.7 - 15.5 g/dL    Hematocrit 16.1 (L) 35.0 - 45.0 %    MCV 108.1 (H) 80.0 - 100.0 fL    MCH 39.0 (H) 27.0 - 33.0 pg    MCHC 36.1 (H) 32.0 - 36.0 g/dL    RDW 09.6 (H) 04.5 - 15.0 %    Platelets 195 140 - 400 10E3/uL    Platelet  Estimate Adequate     MPV 10.8 7.5 - 11.5 fL   Differential    Collection Time: 04/07/21  1:49 AM   Result Value Ref Range    Scan Result PERFORMED     Neutrophils Relative 86.9 (H) 40.0 - 80.0 %    Lymphocytes Relative 10.0 (L) 15.0 - 45.0 %    Monocytes Relative 1.8 0.0 - 12.0 %    Eosinophils Relative 1.1 0.0 - 8.0 %    Basophils Relative 0.2 0.0 - 1.0 %    nRBC 0 0 - 0 /100 WBC    Neutrophils Absolute 1,217 (L) 1,500 - 7,800 /uL    Lymphocytes Absolute 140 (L) 850 - 3,900 /uL    Monocytes Absolute 25 (L) 200 - 950 /uL    Eosinophils Absolute 15 15 - 500 /uL    Basophils Absolute 3 0 - 200 /uL    PLT Morphology Platelet morphology appears normal    Hepatic Function Panel    Collection Time: 04/07/21  1:49 AM   Result Value Ref Range    Total Bilirubin 13.8 (H) 0.0 - 1.5 mg/dL    Bilirubin, Direct 4.09 (H) 0.00 - 0.40 mg/dL    AST 811 (H) 13 - 39 U/L    ALT 463 (H) 7 - 52 U/L    Alkaline Phosphatase 106 36 - 125 U/L    Total Protein 6.4 6.4 - 8.9 g/dL    Albumin 3.0 (L) 3.5 - 5.7 g/dL   Lipase    Collection Time: 04/07/21  1:49 AM   Result Value Ref Range    Lipase 72 4 - 82 U/L   Protime-INR    Collection Time: 04/07/21  1:49 AM   Result Value Ref Range    Protime 20.7 (H) 12.1 - 15.1 seconds    INR 1.7 (H) 0.9 - 1.1   Lactic acid, venous, whole blood    Collection Time: 04/07/21  1:49 AM   Result Value Ref Range    Lactate, Ven 5.8 (H) 0.5 - 2.2 mmol/L   BNP    Collection Time: 04/07/21  1:49 AM   Result Value Ref Range    BNP 139 (H) 0 - 100 pg/mL   High Sensitivity Troponin    Collection Time: 04/07/21  1:49 AM   Result Value Ref Range    High Sensitivity Troponin 3 0 - 14 ng/L   Acetaminophen level    Collection Time: 04/07/21  2:30 AM   Result Value Ref Range    Acetaminophen Level <10 (L) 10 - 30 ug/mL   Lactic acid    Collection Time: 04/07/21  2:30 AM   Result Value Ref Range    Lactate, Ven 5.6 (H) 0.5 - 2.2 mmol/L   #1  Blood culture-Peripheral site 1    Collection Time: 04/07/21  2:30 AM    Specimen:  Peripheral; Blood   Result Value Ref  Range    Culture Result No Growth To Date    #2  Blood culture-Peripheral site 2    Collection Time: 04/07/21  2:30 AM    Specimen: Peripheral; Blood   Result Value Ref Range    Culture Result No Growth To Date    Pregnancy Qualitative, Serum, [facility] Only    Collection Time: 04/07/21  4:37 AM   Result Value Ref Range    Preg, Serum POSITIVE (A) NEGATIVE   High Sensitivity Troponin ( )    Collection Time: 04/07/21  4:37 AM   Result Value Ref Range    High Sensitivity Troponin 3 0 - 14 ng/L   Urinalysis-Macroscopic w/Rfx to [employer] Time: 04/07/21  6:18 AM   Result Value Ref Range    Color, UA Dark yellow (A) Yellow,Straw    Clarity, UA Clear Clear    Specific Gravity, UA 1.025 1.005 - 1.035    pH, UA 5.5 5.0 - 8.0    Protein, UA Trace (A) Negative mg/dL    Glucose, UA 284 mg/dL (A) Negative mg/dL    Ketones, UA Trace (A) Negative mg/dL    Bilirubin, UA Large (A) Negative    Blood, UA Negative Negative    Nitrite, UA Negative Negative    Urobilinogen, UA 2.0 E.U./dL 0.2 - 1.0 EU/dL    Leukocytes, UA Negative Negative   Urinalysis, Microscopic    Collection Time: 04/07/21  6:18 AM   Result Value Ref Range    RBC, UA <1 0 - 3 /HPF    WBC, UA 2 0 - 5 /HPF    Squam Epithel, UA 2 0 - 5 /HPF    Mucus, UA Present (A) None Seen /HPF    Amorphous, UA Present (A) None Seen /HPF       Imaging:  US Transvaginal/Transabdominal  Result Date: 04/07/2021  FINDINGS:   The uterus measures 8.5 x 2.8 x 2.6 cm to provide an estimated volume of 32.4 cc. The endometrium is 1.1 cm in thickness. There is no visible intrauterine gestational sac.     There is trace free fluid in the pelvis.     The left ovary measures 3.5 x 6.5 x 5.1 cm to provide an estimated volume of 60.5 cc. There are at least 3 isoechoic to hypoechoic foci measuring approximately 2.5 cm in diameter without internal flow which may represent hemorrhagic cysts. Multiple additional follicles is scattered theca lutein cysts  are noted. There is arterial and venous flow documented within the left ovary.     The right ovary measures 4.2 x 6.0 x 5.9 cm to provide an estimated volume of 78 cc. Multiple theca lutein cysts are noted. There is arterial and venous flow documented within the right ovary.     IMPRESSION: No gestational sac is visualized within the uterine (in the setting of a positive hCG, this would represent a pregnancy of unknown location and viability). Bilateral enlarged ovaries with thecal lutein cysts consistent with the history of ovarian stimulation. Foci on the left most likely represent hemorrhagic cysts. Follow-up imaging is recommended to evaluate for evolution. Report Verified by: Norberta Keens, MD at 04/07/2021 11:44 AM EDT    X-ray Portable Chest  Result Date: 04/07/2021  IMPRESSION: No acute cardiopulmonary abnormality. Approved by Barnie Mort, MD on 04/07/2021 2:39 AM EDT I have personally reviewed the images and I agree with this report. Report Verified by: Lana Fish, MD at 04/07/2021 2:59 AM EDT      A/P:  Tracy Watkins is a 25 y.o. G1P0010 with PMHx significant for autoimmune hepatitis and PCOS who presents for 1.5 week history of abdominal pain, jaundice, and scleral icterus. Consulted for OHSS concern with liver dysfunction and abdominal pain in the setting of recent IVF cycle. Presentation not consistent with  Typical OHSS findings and is concerning for AIH exacerbation.     Abdominal Pain  Autoimmune Hepatitis Flare  - Patient with 5 year history of Auto-Immune Hepatitis. Previously on the transplant list, but now follows with [facility]  - Currently undergoing IVF with [facility]. Using Follistim and Novarel. Triggered with Ovidreal, Leupron, Ganirelix for egg retrieval 7/22  (16 eggs retrieved, E2 2100)  - 1.5 week history of worsening RLQ and LLQ pain, jaundice scleral icterus, pleuritic chest pain, BLE edema, ascites, nausea  - On exam pt jaudiced with scleral icterus, afebrile,  normotensive, on RA, with tachycardia worsened with pain, moderated abdominal distension with TTP, otherwise normal exam  - Labs notable for transaminitis (AST 623, ALT 463),  leukopenia (WBC 1.4), hyperlactemia (Lactate 5.6), hyperbilirubinemia (T Bili 13.8/Direct 7.24), mild hypoalbuminemia (3.0), and PT/INR 20.7/1.7.  - Electrolytes wnl, hemoconcentration not noted  - TVUS notable for cysts and consistent with H/O recent retrieval and without evidence of torsion or rupture (trace free fluid). Although she has a positive HCG she did recently undergo HCG trigger for oocyte retrieval. PUL is unlikely.  - CXR wnl  - The case was discussed with [facility], REI, and  [facility]  (Dr. Fayette Pho).  Although diagnosis of OHSS can be difficult to ascertain in the setting of known auto-immune hepatitis at this time her presentation is not consistent with OHSS. While abdominal pain and ascites could be c/w OHSS,it is unlikely to see this degree of liver failure 2/2 OHSS without hemoconcentration, creatinine/BUN elevation, electrolyte derangements, and leukocytosis.  - Would recommend further evaluation and  management via GI/Hepatology team. Gynecology recommendations are as follows:  -Daily weights, stict I/Os, CBC, BMPs, correct electrolyte abnormalities   - Pain control per primary   - Consider letrozole 2.5mg /day for 7days (or cabergoline 0.5mg /day for 7 days), if appropriate in regards to her liver as this may help reduce her risk of progressing into OHSS given her current liver impairment,  paracentesis for comfort, Heparin       Disposition: Per Primary team, Gynecology will continue to follow peripherally. Please feel free to consult again if there are further questions or concerns    Seen and Discussed with Attending Dr. Langston Masker, R3 Dr. Darrow Bussing, MD  OBGYN  PGY-1    I have seen and evaluated the patient with the Attending and R1. Note has been edited where appropriate  Allena Katz, MD  MS  OB/GYN-PGY3

## 2021-04-07 NOTE — Unmapped (Signed)
Pt c/o still being in abd pain, and is now having a slight pink dschg when she wipes, pt c/o the dilaudid is not helping at all, and wants something different for the pains,

## 2021-04-07 NOTE — Unmapped (Signed)
Pt c/o not getting her regular home meds, pt oriented to another admitting md will be to see her soon, and to let him know the meds you need and they will be ordered,

## 2021-04-07 NOTE — Unmapped (Signed)
Department of Internal Medicine  History & Physical    Patient: Tracy Watkins  MRN: [id number]  CSN: [id number]      Chief Complaint: Abdominal Pain    History of Present Illness     Raychel Dowler is a 25 y.o. female with cirrhosis d/b ascites and non-bleeding EV PCOS and h/o Cirrhosis d/t Autoimmune Hepatitis, presenting to the hospital with abdominal pain and elevated liver enzymes.    Pt first noted yellowing of eyes/mucous membranes and fatigue, that started about 1-2wks ago, shortly after starting injections for IVF protocol. Over the past 3-4d developed worsening abdominal pain, bloating, and nausea, prompting ED visit. Pain started in RUQ and epigastric region, but pt reports the addition of lower abdominal pain since recent GYN procedure. Prior to admission, pt had been tolerating PO and regular BMs. Reports some non-bloody loose stool today. No change in stool color, but notes urine has been noticeably darker for the last few days. Pt also reports one episode of non-bloody emesis since admission.     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 Liver biopsy at that time revealed underlying cirrhosis with severe hepatitis, and pt was listed for liver transplant. Apart from mild jaundice, pt is not clinically decompensated; no GI bleeding, ascites, or confusion. EGD in September 2017 showed some small varices; recent EGD December 2019 without evidence of varices. Of note, pt was on liver [facility]list [facility] but was removed in May 2021 d/t stable liver disease. Pt continues to have follow-up with GI as OP q52mo.     Pt followed by [facility]; recently completed first IVF cycle, including egg retrieval on 07/22. Per GYN note, procedure was reported to be uncomplicated. Pt in significant discomfort since that procedure. Although abdominal pain was initially localized R upper and epigastric pain, pt is no longer able to differentiate between abdominal vs ovarian pain. Describes pain as more diffuse  cramping, stabbing.         Review of Systems     Review of Systems   Constitutional: Positive for malaise/fatigue. Negative for fever.   HENT: Negative for sinus pain and sore throat.    Eyes: Negative for blurred vision and photophobia.   Respiratory: Negative for cough and hemoptysis.         Some mild shortness of breath since COVID resolution at the end of May   Cardiovascular: Negative for palpitations.   Gastrointestinal: Positive for abdominal pain, nausea and vomiting. Negative for blood in stool, constipation and diarrhea.   Genitourinary: Negative for dysuria, frequency, hematuria and urgency.   Musculoskeletal: Negative for back pain, joint pain and myalgias.   Skin: Positive for rash. Negative for itching.        + red patches on bilateral hands for several weeks   Neurological: Negative for sensory change, speech change, loss of consciousness and headaches.   Endo/Heme/Allergies: Bruises/bleeds easily.        + increased bruising for 1-2wks       Past Medical, Surgical, Family, and Social Histories     Past Medical History:   Diagnosis Date   ??? Acne    ??? Amenorrhea    ??? 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 Bilateral leg edema    ??? Cirrhosis (CMS Dx)    ??? Dysmenorrhea    ??? Esophageal varices (CMS Dx) 05/2016    Grade 1/small, no history of bleeding   ??? H/O infertility    ??? PCOS (polycystic ovarian syndrome)  Past Surgical History:   Procedure Laterality Date   ??? ESOPHAGOGASTRODUODENOSCOPY N/A 02/05/2021    Procedure: ESOPHAGOGASTRODUODENOSCOPY WITH MAC;  Surgeon: Wallene Dales, MD;  Location: [facility] ENDOSCOPY;  Service: Gastroenterology;  Laterality: N/A;   ??? ESOPHAGOGASTRODUODENOSCOPY N/A 02/05/2021    Procedure: EGD WITH BIOPSY;  Surgeon: Wallene Dales, MD;  Location: [facility] ENDOSCOPY;  Service: Gastroenterology;  Laterality: N/A;   ??? LIVER BIOPSY     ??? UPPER GASTROINTESTINAL ENDOSCOPY         Family History   Problem Relation Age of Onset   ??? Hearing loss Mother    ??? No Known Problems Father    ???  Pompe disease Maternal Grandfather    ??? Hearing loss Brother    ??? Hearing loss Maternal Grandmother    ??? Anesthesia problems Neg Hx        Social History     Socioeconomic History   ??? Marital status: Married     Spouse name: Not on file   ??? Number of children: Not on file   ??? Years of education: Not on file   ??? Highest education level: Not on file   Occupational History   ??? Not on file   Tobacco Use   ??? Smoking status: Never Smoker   ??? Smokeless tobacco: Never Used   Vaping Use   ??? Vaping Use: Never used   Substance and Sexual Activity   ??? Alcohol use: No   ??? Drug use: No   ??? Sexual activity: Yes     Partners: Male     Birth control/protection: None   Other Topics Concern   ??? Caffeine Use Yes   ??? Occupational Exposure Not Asked   ??? Exercise Yes   ??? Seat Belt Yes   Social History Narrative   ??? Not on file     Social Determinants of Health     Financial Resource Strain: Not on file   Physical Activity: Not on file   Stress: Not on file   Social Connections: Not on file   Housing Stability: Not on file       Medications     Home Meds:  (Not in a hospital admission)      Physical Exam     Physical Exam  Vitals reviewed.   Constitutional:       Appearance: She is obese.   HENT:      Head: Normocephalic.      Mouth/Throat:      Mouth: Mucous membranes are dry.      Pharynx: Oropharynx is clear.   Eyes:      General: Scleral icterus present.      Extraocular Movements: Extraocular movements intact.      Conjunctiva/sclera: Conjunctivae normal.   Cardiovascular:      Rate and Rhythm: Regular rhythm. Tachycardia present.      Pulses: Normal pulses.      Heart sounds: Murmur heard.   Pulmonary:      Effort: Pulmonary effort is normal.      Breath sounds: Normal breath sounds.   Abdominal:      General: Bowel sounds are normal. There is distension.      Tenderness: There is abdominal tenderness. There is guarding.   Musculoskeletal:      Cervical back: Normal range of motion. No rigidity or tenderness.   Lymphadenopathy:       Cervical: No cervical adenopathy.   Skin:     General: Skin is dry.  Coloration: Skin is jaundiced.      Findings: Rash present.      Comments: + eczematous rash on bilateral hands   Neurological:      General: No focal deficit present.      Mental Status: She is alert and oriented to person, place, and time.   Psychiatric:         Attention and Perception: Attention normal.         Mood and Affect: Mood normal.         Speech: Speech normal.         Behavior: Behavior normal. Behavior is cooperative.           Diagnostic Studies     Laboratory data was reviewed and relevant labs noted as follows:     Latest Reference Range & Units 04/07/21 17:45   Protime 12.1 - 15.1 seconds 20.5 (H)   INR 0.9 - 1.1  1.7 (H)   (H): Data is abnormally high     Latest Reference Range & Units 04/07/21 17:45   Hep A IgM Nonreactive  Nonreactive   Hep B Core Total Ab Nonreactive  Nonreactive   Hep B Surface Ab Nonreactive  Reactive !   HBSAB NUMBER 0.00 - 9.99 mIU/mL 24.30 (H)   Hep B Surface Ag Nonreactive  Nonreactive   HCV Ab Nonreactive  Nonreactive   !: Data is abnormal  (H): Data is abnormally high     Latest Reference Range & Units 04/07/21 17:45   Alkaline Phosphatase 36 - 125 U/L 96   AST (SGOT) 13 - 39 U/L 461 (H)   ALT (SGPT) 7 - 52 U/L 414 (H)   Albumin 3.5 - 5.7 g/dL 2.9 (L)   Protein, Total 6.4 - 8.9 g/dL 6.3 (L)   Bili, Total 0.0 - 1.5 mg/dL 09.3 (H)   (H): Data is abnormally high  (L): Data is abnormally low        Assessment & Plan       Tish Begin is a 25 y.o. female with PCOS, Cirrhosis 2/2 Autoimmune Hepatitis, is being admitted to the hospital for evaluation of elevated liver enzymes, acute abdominal pain.      #Elevated Liver Enzymes, history of Autoimmune Hepatitis  - Acute abdominal pain and significant elevation in liver enzymes seems to coincide with IVF treatment which began 2wks ago. Concern for Ovarian Hyperstimulation Syndrome vs acute flare of Autoimmune Hepatitis. Possible that pt's underlying  cirrhosis may be causing a more severe presentation of OHSS. Also considering drug associated hepatic injury, given extensive history of medications recently received as a part of IVF protocol. Prothrombic state associated with IVF could also raise concern for thromboembolization such as Budd-Chiari; though imaging does not seem to suggest that at this time. Infectious etiology should also be ruled out, however this is much lower on the differential.  - Follow-up on Infectious workup: HAV, HBV, HCV, HepE, CMV, EBV  - Follow-up UDS  - Follow-up Abd [place] with Duplex  - Gyn consulted; appreciate recs  - Liver consulted; appreciate recs:      - Start Prednisone 40 daily     - Due to significant history of cirrhosis, autoimmune hepatitis and acute elevation of INR, liver enzymes, will closely monitor patient at this time.  - q2h Mental Status Checks  - q6h CMP, INR  - If any abnormalities or acutely worsening clinical picture, have low threshold to contact team/GI attending      Code Status: Full  DVT Prophylaxis: SubQ Hep  Nutrition: Clear Liquids  Disposition Plan: GI Floor          Arvid Right, MD  PGY1 Internal Medicine  Pager: [phone]  04/07/2021, 4:06 PM

## 2021-04-07 NOTE — Unmapped (Signed)
Bed: ZO1096  Expected date:   Expected time:   Means of arrival:   Comments:  B21 when cleaned

## 2021-04-07 NOTE — Unmapped (Signed)
COVID testing done and sent up to the lab per RN, pt aware and agreeable to the testing

## 2021-04-07 NOTE — Unmapped (Signed)
Pt c/o needing to urinate too often,

## 2021-04-07 NOTE — Unmapped (Signed)
MD at bedside. - ob/gyn resident

## 2021-04-07 NOTE — Unmapped (Signed)
[facility] ED Note    Date of Service: 04/07/2021  Reason for Visit: Abdominal Pain and Abnormal Lab      Patient History     HPI  Tracy Watkins is a 25 y.o. female with a PMH significant for cirrhosis, autoimmune hepatitis, esophageal varices, PCOS, and dysmenorrhea presents for evaluation of abdominal pain.    Patient states that for the past week and a half she has been noticing jaundiced skin and scleral icterus.  She feels that these have been getting progressively worse.  She also endorses malaise, abdominal pain, nausea without emesis, and peripheral edema.  She states that she has been drinking 20 of water; however her urine has been increasingly dark.  She denies any changes to her bowel movements.  She has having frequent headaches and lightheadedness.  She has not had any syncope and denies any confusion.  She has had chest pain in the center of her chest that she describes as pressure-like.  It is pleuritic in nature.  It is also associated with mild shortness of breath that is worse with exertion.    She states that she has been taking Tylenol several times a day to help with her epigastric pain.  She describes the pain as sharp and states that it radiates slightly to the right.  She has been taking her medications as prescribed.      Other than stated above, no additional associated symptoms or aggravating or alleviating factors are noted.    Past Medical History:   Diagnosis Date   ??? Acne    ??? Amenorrhea    ??? 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 Bilateral leg edema    ??? Cirrhosis (CMS Dx)    ??? Dysmenorrhea    ??? Esophageal varices (CMS Dx) 05/2016    Grade 1/small, no history of bleeding   ??? H/O infertility    ??? PCOS (polycystic ovarian syndrome)      Past Surgical History:   Procedure Laterality Date   ??? ESOPHAGOGASTRODUODENOSCOPY N/A 02/05/2021    Procedure: ESOPHAGOGASTRODUODENOSCOPY WITH MAC;  Surgeon: Wallene Dales, MD;  Location[facility]  ENDOSCOPY;  Service: Gastroenterology;  Laterality: N/A;   ??? ESOPHAGOGASTRODUODENOSCOPY N/A 02/05/2021    Procedure: EGD WITH BIOPSY;  Surgeon: Wallene Dales, MD;  Location: [facility] ENDOSCOPY;  Service: Gastroenterology;  Laterality: N/A;   ??? LIVER BIOPSY     ??? UPPER GASTROINTESTINAL ENDOSCOPY       Patient  reports that she has never smoked. She has never used smokeless tobacco. She reports that she does not drink alcohol and does not use drugs.  Previous Medications    AZATHIOPRINE (IMURAN) 50 MG TABLET    Take 2 tablets (100 mg total) by mouth daily.    BROMOCRIPTINE (PARLODEL) 2.5 MG TABLET    Take 2.5 mg by mouth daily.    ERGOCALCIFEROL (ERGOCALCIFEROL) 1,250 MCG (50,000 UNIT) CAPSULE    Take 50,000 Units by mouth once a week. Patient taking twice a week    HYDROCHLOROTHIAZIDE (HYDRODIURIL) 25 MG TABLET    Take 25 mg by mouth daily.    LEVOTHYROXINE (SYNTHROID) 112 MCG TABLET    Take 112 mcg by mouth every morning before breakfast.    METFORMIN (GLUCOPHAGE-XR) 500 MG 24 HR TABLET    2 TAB PO BID WITH MEALS Indications: polycystic ovarian syndrome    NIRMATRELVIR - RITONAVIR (PAXLOVID) 150 MG X 2- 100 MG TAB    Take 3 tablet combination (2 x 150 mg nirmatrevir tablets,  300 mg total) (1 x 100 mg ritonavir tablet) twice daily for 5 days.    PRENATAL VIT,CAL 74/IRON/FOLIC (PRENATAL VITAMIN 1+1 ORAL)    Take by mouth.       Allergies:   Allergies as of 04/06/2021 - Fully Reviewed 02/05/2021   Allergen Reaction Noted   ??? Latex Rash 03/05/2016   ??? Penicillins Rash 01/15/2016   ??? Nsaids (non-steroidal anti-inflammatory drug)  04/16/2020       All nursing notes and triage notes were appropriately reviewed in the course of the creation of this note.     Review of Systems     Review of Systems   Constitutional: Positive for malaise/fatigue. Negative for chills and fever.   HENT: Negative for nosebleeds and tinnitus.    Eyes: Negative for pain.   Respiratory: Positive for shortness of breath. Negative for cough, hemoptysis and  sputum production.    Cardiovascular: Positive for chest pain and leg swelling. Negative for orthopnea and PND.   Gastrointestinal: Positive for abdominal pain and nausea. Negative for blood in stool, constipation, diarrhea, melena and vomiting.   Genitourinary: Positive for frequency. Negative for dysuria and urgency.   Musculoskeletal: Negative for falls.   Neurological: Positive for weakness and headaches. Negative for focal weakness and loss of consciousness.   Endo/Heme/Allergies: Does not bruise/bleed easily.   Psychiatric/Behavioral: Negative for substance abuse.        All other systems are negative except as mentioned in HPI.  Physical Exam     There were no vitals filed for this visit.    Physical Exam  Constitutional:       General: She is not in acute distress.  HENT:      Head: Normocephalic and atraumatic.      Mouth/Throat:      Mouth: Mucous membranes are moist.   Eyes:      Pupils: Pupils are equal, round, and reactive to light.   Cardiovascular:      Rate and Rhythm: Regular rhythm. Tachycardia present.      Heart sounds: Normal heart sounds.   Pulmonary:      Effort: Pulmonary effort is normal.      Breath sounds: Normal breath sounds.   Abdominal:      Tenderness: There is generalized abdominal tenderness and tenderness in the right upper quadrant and epigastric area. There is no right CVA tenderness, left CVA tenderness, guarding or rebound.   Skin:     Capillary Refill: Capillary refill takes less than 2 seconds.      Coloration: Skin is not cyanotic or mottled.   Neurological:      General: No focal deficit present.      Mental Status: She is alert and oriented to person, place, and time.   Psychiatric:         Mood and Affect: Mood normal.         Behavior: Behavior normal.             Diagnostic Studies     Labs:  Please see electronic medical record for any tests performed in the ED    Radiology:  No orders to display       EKG:     EKG Interpretation    Interpreted by emergency department  physician    Rhythm: normal sinus   Rate: normal  Axis: normal  Ectopy: none  Conduction: normal  ST Segments: no acute change  T Waves: no acute change  Q Waves: none  Clinical Impression: no acute changes    Tracey Harries, MD, PGY-1    Emergency Department Procedures   Procedures    ED Course and MDM     Kayelee Herbig is a 25 y.o. female with a history and presentation as described above in HPI.  The patient was evaluated by myself and the ED Attending Physician, Dr. Helen Hashimoto, and R4 Dr.  Dr. Criselda Peaches,. All management and disposition plans were discussed and agreed upon.        ED Course as of 04/07/21 0924   Tue Apr 07, 2021   0151 There is significant concern at this time that the patient is in acute liver failure.  There may also be a component of nephrotic syndrome considering her peripheral edema.  This makes her at an increased risk for coagulopathies.  We will initiate a broad work-up evaluating her liver function and we will also order an EKG and high-sensitivity troponin. [AB]   0210 Lactic acid, venous, whole blood(!):    Lactate, Ven 5.8(!)  Her lactic acid is markedly increased to 5.8 [AB]   0227 There is concern that the patient may be showing signs of sepsis with an increased heart rate, respiratory rate, and elevated lactic acid of 5.8.  We will begin treating her with broad-spectrum antibiotic coverage and give her 1 L of Normosol. [AB]   0243 Lipase:    Lipase 72  Lipase is within normal limits lowering concern for pancreatitis [AB]   0244 Lactic acid(!):    Lactate, Ven 5.6(!)  Her repeat lactate is still elevated at 5.6 however it is mildly reduced [AB]   0244 Protime-INR(!):    Protime 20.7(!)   INR 1.7(!)  Her INR is increased to 1.7.  This is increased from 3 months prior [AB]   0306 X-ray Portable Chest  No acute cardiopulmonary abnormality. [AB]   0309 Acetaminophen level(!):    Acetaminophen Level <10(!)  Her Tylenol level is within normal limits. [AB]   0309 BNP(!):    BNP 139(!)  Her  BMP is mildly elevated at 139 [AB]   0318 WBC(!): 1.4  Her white blood cell count is decreased increasing likelihood for sepsis [AB]   0325 High Sensitivity Troponin:    High Sensitivity Troponin 3  Her troponin is 3.  We will obtain a 1 hour delta. [AB]   [id number] Hepatic Function Panel(!):    Bili, Total 13.8(!)   Bilirubin, Direct 7.24(!)   AST (SGOT) 623(!)   ALT (SGPT) 463(!)   Alkaline Phosphatase 106   Protein, Total 6.4   Albumin 3.0(!)  Her ALT and AST are elevated at 463 and 623 respectively.  Total bilirubin is elevated at 13.8.  Her albumin is decreased at 3.0. [AB]   0552 High Sensitivity Troponin ( ):    High Sensitivity Troponin 3  Her delta is flat [AB]   0644 Patient's care was discussed with the GI fellow.  They are planning to come evaluate her in the ED and provide recommendations on further evaluation, management, and disposition. [AB]      ED Course User Index  [AB] Tracey Harries, MD        Medications received during this ED visit:  Medications - No data to display    t this time I am going off-service and will be signing out care of this patient to my colleague Dr. Orion Crook for further care.     Impression     No diagnosis found.  Plan   1. The patient is to be signed out in stable condition  2. Workup, treatment and diagnosis were discussed with the patient and/or family members; the patient agrees to the plan and all questions were addressed and answered.      Tracey Harries, MD, PGY-1  [facility]    Critical Care Time (Attendings)            Tracey Harries, MD  Resident  04/07/21 [phone]

## 2021-04-07 NOTE — Unmapped (Signed)
Department of Digestive Diseases  Hepatology Consult Note    Name: Tracy Watkins  MRN: [id number]  CSN: [id number]    Consulted by: Janne Napoleon, MD  Reason for consult: Elevated Liver Enzymes      History of Present Illness:  Tracy Watkins is a 25 y.o. 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    Patient accompanied by husband at bedside. States that symptoms of dizziness, jaundice started about 1.5 - 2 weeks ago, shortly after the start of starting injection as part of an In vitro fertilization protocol. She states that she received 4 injections though is not sure of all the medication names. She believes one was follistim.  In the last 3-4 days abdominal pain has been worsening. Reports diffuse abdominal pain that is currently severe. Difficult to find a comfortable position. Denies confusion currently though is persistently lightheaded. Having regular BMs. Has been taking Imuran 100mg  daily. Only missed dose yesterday due to illness and hospitalization. Denies alcohol, tobacco, or illcit drug use. No other OTC medications or fertility supplements outside planned treatment.    On arrival liver enzymes were notable for AlkP of 106, AST/ALT of 623/463 and tbili 13.8, having all been WNL on 12/16/20. Lactate was elevated to 5.8 and 5.6 after 1L of IVF. Tylenol was negative .She says that pregnancy at this point is not possible and the positive test is just related to these medications.    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 Her liver tests were elevated - AST-266, ALT-210, ALP-255, TB-2.8 with imaging suggestive of cirrhosis. Her serological evaluation was strongly suggestive of AIH - + ANA, ASMA-74, IgG-3200, AMA-negative. She underwent liver biopsy in June 2017 which revealed underlying cirrhosis with severe hepatitis - predominantly lymphoplasmacytic infiltrate with interface activity, hepatocyte  dropout, incomplete granulmomas, and bile ductular proliferation. As a result, she was initiated on high dose prednisone 60 mg and eventually azathioprine 100 mg was added in late July 2017. She listed for liver transplant at [facility] in [place]. Due to plateaued liver tests, she was transitioned to Cellcept 1 gram BID in August / Sept 2017 and tapered rapidly off of prednisone. Then switched to Imuran 125 mg daily. Other than mild jaundice, she is not clinically decompensated without any GI bleeding, ascites or confusion. Her EGD in Sept 2017 had evidence of small varices. Had EGD in Dec 2019 at [facility] which did not show any varices. Her evaluation for transplant was unremarkable in [place]. She moved to [place], [place] in Jan 2018 due to husband's job in [employer]. She was on liver wait-list at [facility] but removed in May 2021 due to stable liver disease    Review of Systems   Positive for dizziness, abdominal pain, jaundice  10 system otherwise negative    Past Medical History:   Diagnosis Date   ??? Acne    ??? Amenorrhea    ??? 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 Bilateral leg edema    ??? Cirrhosis (CMS Dx)    ??? Dysmenorrhea    ??? Esophageal varices (CMS Dx) 05/2016    Grade 1/small, no history of bleeding   ??? H/O infertility    ??? PCOS (polycystic ovarian syndrome)         Past Surgical History:   Procedure Laterality Date   ??? ESOPHAGOGASTRODUODENOSCOPY N/A 02/05/2021    Procedure: ESOPHAGOGASTRODUODENOSCOPY  WITH MAC;  Surgeon: Wallene Dales, MD;  Location: [facility] ENDOSCOPY;  Service: Gastroenterology;  Laterality: N/A;   ??? ESOPHAGOGASTRODUODENOSCOPY N/A 02/05/2021    Procedure: EGD WITH BIOPSY;  Surgeon: Wallene Dales, MD;  Location: [facility] ENDOSCOPY;  Service: Gastroenterology;  Laterality: N/A;   ??? LIVER BIOPSY     ??? UPPER GASTROINTESTINAL ENDOSCOPY          Family History   Problem Relation Age of Onset   ??? Hearing loss Mother    ??? No Known Problems Father    ??? Pompe disease  Maternal Grandfather    ??? Hearing loss Brother    ??? Hearing loss Maternal Grandmother    ??? Anesthesia problems Neg Hx         Social History     Tobacco Use   ??? Smoking status: Never Smoker   ??? Smokeless tobacco: Never Used   Substance Use Topics   ??? Alcohol use: No        Allergies   Allergen Reactions   ??? Latex Rash     Other reaction(s): Rash, urticarial  hives  hives     ??? Penicillins Rash   ??? 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        Scheduled Meds:  ??? HYDROmorphone  0.5 mg Intravenous Once     Continuous Infusions:    PRN Meds:      Prior to Admission Meds:  (Not in a hospital admission)      Vitals:  Temp:  [98.2 ??F (36.8 ??C)] 98.2 ??F (36.8 ??C)  Heart Rate:  [103-120] 107  Resp:  [19-28] 26  BP: (116-132)/(57-77) 129/73    Intake/Output Summary (Last 24 hours) at 04/07/2021 0804  Last data filed at 04/07/2021 0618  Gross per 24 hour   Intake 1100 ml   Output --   Net 1100 ml       Physical Exam   Gen: Moderately uncomfortable. Jaundiced. Sitting on side of bed.  HEENT: Nares patent. Mucous membranes pink/moist. + scleral icterus.   Neck: Neck is supple. No JVD present. No tracheal deviation. No cervical lymphadenopathy.  CV: Tachycardic, no murmur  Lungs: Clear to auscultation bilaterally. No wheezes/rales/rhonchi. No respiratory distress.   Abdomen: Obese, moderately TTP diffusely.  Extremities: Pitting edema present   Skin: Jaundiced.  Neuro: Alert and oriented to person, place, and time. CN 2-12 grossly intact. No gross motor defecits. No asterixis  Psych: Normal mood and affect. Normal speech and behavior.     Laboratory:  Lab Results   Component Value Date    WBC 1.4 (L) 04/07/2021    HGB 10.9 (L) 04/07/2021    HCT 30.2 (L) 04/07/2021    MCV 108.1 (H) 04/07/2021    PLT 195 04/07/2021     Lab Results   Component Value Date    NA 136 04/07/2021    K 3.5 04/07/2021    CL 102 04/07/2021    CO2 23 04/07/2021    BUN 14 04/07/2021    CREATININE 0.66  04/07/2021    GLUCOSE 102 (H) 04/07/2021    CALCIUM 8.6 04/07/2021    PHOS 2.3 01/28/2020     Lab Results   Component Value Date    AST 623 (H) 04/07/2021    ALT 463 (H) 04/07/2021    BILITOT 13.8 (H) 04/07/2021    BILIDIRECT 7.24 (H) 04/07/2021    PROT 6.4 04/07/2021    ALBUMIN 3.0 (  L) 04/07/2021    ALKPHOS 106 04/07/2021     Lab Results   Component Value Date    INR 1.7 (H) 04/07/2021        No results found for: AMYLASE  Lab Results   Component Value Date    LIPASE 72 04/07/2021       Lab Results   Component Value Date    HEPAIGM Nonreactive 12/20/2016    HEPBCAB Nonreactive 12/20/2016     No results found for: ANA  No results found for: SMOOTHMUSCAB  Lab Results   Component Value Date    IRON 98 12/20/2016    TIBC 267 12/20/2016    FERRITIN 28.6 12/20/2016     No results found for: OCCULTBLD    No results found for: AFP  No results found for: CEA  No results found for: CA125    No results found for: VITAMINB12  No results found for: FOLATE    Microbiology:   No results found for: AEROBOT, ANABOT, LABGRAM, ISO2, ISO3, ISO4, ISO5, ISO6, PNAFISH    Images:  X-ray Portable Chest   Final Result   IMPRESSION:    No acute cardiopulmonary abnormality.      Approved by Barnie Mort, MD on 04/07/2021 2:39 AM EDT      I have personally reviewed the images and I agree with this report.      Report Verified by: Lana Fish, MD at 04/07/2021 2:59 AM EDT            Assessment/Plan:  Tracy Watkins is a 25 y.o. 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      #Elevated liver enzymes, history of AIH: significant, acute elevation in liver enzymes coincides with recent IVF treatment about 2 weeks ago. While flare of autoimmune hepatitis is high on the differential, it is also possible that elevations are related to Ovarian hyperstimulation syndrome with a more severe presentation given her underlying cirrhosis. This prothrombic state  would also raise concern for thromboembolization such as Budd-chiari syndrome. Infectious etiology also need to be ruled out however are lower on the differential    Recommendations:  -Check Abdominal ultrasound with Duplex  -Check hepatitis serologies    HAV IgM   HBV Surface Ag, Surface Ab, Core IgM, DNA   HCV Ab  -CMV, EBV have been ordered  -Check UDS  -Hepatitis E has been ordered.  -Check Total IgG levels  -Start Prednisone 40mg  daily            HE: None  Varices: Last EGD 02/05/21: Small varices. Repeat in 1-2 years.  Ascites: None previously  HCC:US negative 12/2020  Immunization: Immune to HAV, HBV      Case to be discussed with attending physician Faylene Million. Recommendations final following attestation.    Samson Frederic, MD  Gastroenterology/Hepatology Fellow- PGY 6  Pager [phone]

## 2021-04-07 NOTE — Unmapped (Signed)
Cross Cover Note    Evaluated patient at bedside. Received in sign out that she was having some worsening bilateral leg pain. Patient reports worsening pain in her ankles and feet bilaterally as her legs have become more swollen. She does note worse pain and more swelling in the left leg. Denies any calf pain bilaterally. Did not notice any rash. She says that she did have an ultrasound of her left lower extremity on Monday at [facility] which she was told was normal. She describes a multitude of other stable symptoms, including dizziness, abdominal pain, SOB with ambulation/talking.     Phlebotomy in room during exam; having trouble getting labs from patient, going to ask nursing if could possibly draw from line.     Vitals:    04/07/21 2155   BP: 115/68   Pulse: 107   Resp: 18   Temp: 97.8 ??F (36.6 ??C)   SpO2: 100%     Of note patient is not yet on telemetry; requires specific leads because she is allergic to adhesive.     Gen: NAD, jaundiced  Resp: no increased WOB  GI: mild tenderness to palpation in all four quadrants, no guarding  Extremities: tenderness to palpation in bilateral ankles and dorsum of feet, no tenderness to palpation in calves bilaterally. No rashes present. LLE swelling slightly greater than RLE.   Neuro: AAOx3, moving all four extremities    Plan:    Will continue to closely monitor mental statu, follow up lab MELD labs.     Awanda Mink, DO, MS  Internal Medicine Resident, PGY-3  Pager [phone]  04/07/2021, 11:55 PM

## 2021-04-07 NOTE — Unmapped (Signed)
Pt to US per wc, per txp services,

## 2021-04-07 NOTE — Unmapped (Signed)
Admit resident at bedside, pt and husband calm, pt c/o having an increase in the abd pain, MD made aware earlier told her she would get some pain meds, no orders found by this RN, will readdress with md asap,

## 2021-04-07 NOTE — Unmapped (Signed)
MD at bedside.

## 2021-04-07 NOTE — Unmapped (Signed)
Pt given hosp bed for comfort pt grateful,

## 2021-04-07 NOTE — Unmapped (Signed)
addtl labs sent,

## 2021-04-07 NOTE — Unmapped (Signed)
hosp bed ordered for the pts comfort,

## 2021-04-07 NOTE — Unmapped (Signed)
[facility] ED  Reassessment Note    Tracy Watkins is a 25 y.o. female who presented to the emergency department on 04/07/2021. This patient was initially seen by an off-going provider and their care has been turned over to me. Please see the original provider's note for details regarding the initial history, physical exam and ED course.  At the time of turnover the following steps in the patient's evaluation were pending: Hepatology consult, reassessment, final disposition    Patient is a 25 year old female history of autoimmune hepatitis, esophageal varices, PCOS, dysmenorrhea, current IVF therapy on Follistim, Menopur, and Ovidril (last injection 7/19) presenting with chief complaint of abdominal pain, bloating, jaundice.  Labs remarkable for transaminitis with AST of 623, ALT of 463, and hyperbilirubinemia with total bili of 13.8.  Lactate elevated at 5.  INR elevated 1.7.  Patient was empirically antibiosis with vancomycin and cefepime.  Patient required multiple doses of IV narcotics for pain control.  She was given 2 L of IV fluids for her tachycardia.  Hepatology has concerns that these hepatic derangements may be secondary to ovarian hyperstimulation syndrome, and recommended consultation with maternal-fetal medicine and gynecology.  Gynecology evaluated the patient recommended a transvaginal ultrasound to further characterize her ovaries for work-up for OHSS.  Hepatology recommended empiric treatment for autoimmune hepatitis flare with 40 mg of prednisone, which was administered.    Gynecology reviewed the imaging, report that is consistent with history of recent egg retrieval, but does not represent concern for ovarian hyperstimulation syndrome.  They also report that there were aspects of her work-up that are inconsistent with this diagnosis including her lack of hemoconcentration and absence of leukocytosis.  They will follow along, but report that there is a lower suspicion for this diagnosis at this  time with her current evidence.  We will plan to admit her to the medicine service for further treatment of autoimmune hepatitis flare.    While the patient does have a positive pregnancy test, abdominal pain, absence of pregnancy visualized in the uterus, I do have a lower suspicion for ectopic pregnancy in this clinical scenario.  The patient has an alternative reason to have a positive beta hCG with her IVF treatment, and has not undergone embryo implantation, nor has had vaginal intercourse since initiating this IVF treatment.  The patient reports that she has very low suspicion that she is pregnant given the temporal history of her vaginal intercourse many weeks ago preceding IVF initiation.  This was discussed with the gynecology team, and they also report that they have a low suspicion for ectopic pregnancy, and that it does not need further investigation at this time.    Clinical Impression:    1.  Hepatitis

## 2021-04-07 NOTE — Unmapped (Signed)
Patient transferred from OSH for autoimmune hepatitis flare up. Pt jaundiced and has swollen extremities with pain to RLE and upper abdomen. Pt alert and oriented, tachy to 110s-120 otherwise VSS.

## 2021-04-07 NOTE — Unmapped (Signed)
Pt returned from US per txp services, per wc, pt calm,

## 2021-04-08 LAB — DIFFERENTIAL
Basophils Absolute: 7 /uL (ref 0–200)
Basophils Relative: 0.2 % (ref 0.0–1.0)
Eosinophils Absolute: 10 /uL (ref 15–500)
Eosinophils Relative: 0.3 % (ref 0.0–8.0)
Lymphocytes Absolute: 408 /uL (ref 850–3900)
Lymphocytes Relative: 12 % (ref 15.0–45.0)
Monocytes Absolute: 184 /uL (ref 200–950)
Monocytes Relative: 5.4 % (ref 0.0–12.0)
Neutrophils Absolute: 2791 /uL (ref 1500–7800)
Neutrophils Relative: 82.1 % (ref 40.0–80.0)
nRBC: 0 /100 WBC (ref 0–0)

## 2021-04-08 LAB — COMPREHENSIVE METABOLIC PANEL
ALT: 359 U/L (ref 7–52)
ALT: 397 U/L (ref 7–52)
AST: 376 U/L (ref 13–39)
AST: 390 U/L (ref 13–39)
Albumin: 2.8 g/dL (ref 3.5–5.7)
Albumin: 2.9 g/dL (ref 3.5–5.7)
Alkaline Phosphatase: 85 U/L (ref 36–125)
Alkaline Phosphatase: 102 U/L (ref 36–125)
Anion Gap: 7 mmol/L (ref 3–16)
Anion Gap: 9 mmol/L (ref 3–16)
BUN: 16 mg/dL (ref 7–25)
BUN: 11 mg/dL (ref 7–25)
CO2: 24 mmol/L (ref 21–33)
CO2: 23 mmol/L (ref 21–33)
Calcium: 7.5 mg/dL (ref 8.6–10.3)
Calcium: 8.3 mg/dL — ABNORMAL LOW (ref 8.6–10.3)
Chloride: 102 mmol/L (ref 98–110)
Chloride: 103 mmol/L (ref 98–110)
Creatinine: 0.7 mg/dL (ref 0.60–1.30)
Creatinine: 0.65 mg/dL (ref 0.60–1.30)
EGFR: >90
EGFR: >90
Glucose: 164 mg/dL (ref 70–100)
Glucose: 83 mg/dL (ref 70–100)
Osmolality, Calculated: 281 mOsm/kg (ref 278–305)
Osmolality, Calculated: 279 mOsm/kg (ref 278–305)
Potassium: 3.6 mmol/L (ref 3.5–5.3)
Potassium: 4.5 mmol/L (ref 3.5–5.3)
Sodium: 133 mmol/L (ref 133–146)
Sodium: 135 mmol/L (ref 133–146)
Total Bilirubin: 12.9 mg/dL (ref 0.0–1.5)
Total Bilirubin: 13.5 mg/dL (ref 0.0–1.5)
Total Protein: 5.7 g/dL (ref 6.4–8.9)
Total Protein: 6.2 g/dL (ref 6.4–8.9)

## 2021-04-08 LAB — PROTIME-INR
INR: 1.8 (ref 0.9–1.1)
INR: 1.8 (ref 0.9–1.1)
INR: 1.9 (ref 0.9–1.1)
INR: 1.8 — ABNORMAL HIGH (ref 0.9–1.1)
Protime: 21.8 seconds (ref 12.1–15.1)
Protime: 21.4 seconds (ref 12.1–15.1)
Protime: 22.3 seconds (ref 12.1–15.1)
Protime: 21.4 s — ABNORMAL HIGH (ref 12.1–15.1)

## 2021-04-08 LAB — BLOOD CULTURE-PERIPHERAL
Culture Result: NO GROWTH
Culture Result: NO GROWTH

## 2021-04-08 LAB — CBC
Hematocrit: 33.4 % (ref 35.0–45.0)
Hemoglobin: 11.6 g/dL (ref 11.7–15.5)
MCH: 38.9 pg (ref 27.0–33.0)
MCHC: 34.8 g/dL (ref 32.0–36.0)
MCV: 111.9 fL — ABNORMAL HIGH (ref 80.0–100.0)
MPV: 7.4 fL (ref 7.5–11.5)
Platelets: 97 10*3/uL (ref 140–400)
RBC: 2.98 10*6/uL (ref 3.80–5.10)
RDW: 23.7 % (ref 11.0–15.0)
WBC: 3.4 10*3/uL (ref 3.8–10.8)

## 2021-04-08 LAB — HEPATIC FUNCTION PANEL
ALT: 359 U/L (ref 7–52)
AST: 322 U/L (ref 13–39)
Albumin: 2.8 g/dL (ref 3.5–5.7)
Alkaline Phosphatase: 99 U/L (ref 36–125)
Bilirubin, Direct: 6.93 mg/dL (ref 0.00–0.40)
Bilirubin, Indirect: 5.97 mg/dL (ref 0.00–1.10)
Total Bilirubin: 12.9 mg/dL (ref 0.0–1.5)
Total Protein: 5.9 g/dL (ref 6.4–8.9)

## 2021-04-08 LAB — MAGNESIUM: Magnesium: 2.4 mg/dL (ref 1.5–2.5)

## 2021-04-08 MED ORDER — vancomycin (VANCOCIN) 2,500 mg in sodium chloride 0.9 % 500 mL IVPB
Freq: Once | INTRAVENOUS | Status: AC
Start: 2021-04-08 — End: 2021-04-08
  Administered 2021-04-08: 15:00:00 2500 mg/kg via INTRAVENOUS

## 2021-04-08 MED ORDER — diphenhydrAMINE (BENADRYL) capsule 25 mg
25 | Freq: Four times a day (QID) | ORAL | Status: AC | PRN
Start: 2021-04-08 — End: 2021-04-09
  Administered 2021-04-08 – 2021-04-09 (×4): 25 mg via ORAL

## 2021-04-08 MED ORDER — vancomycin (VANCOCIN) 1,500 mg in sodium chloride 0.9 % 250 mL IVPB
INTRAVENOUS | Status: AC
Start: 2021-04-08 — End: 2021-04-08

## 2021-04-08 MED ORDER — hydroCHLOROthiazide (HYDRODIURIL) tablet 25 mg
25 | Freq: Every day | ORAL | Status: AC
Start: 2021-04-08 — End: 2021-04-10
  Administered 2021-04-08 – 2021-04-10 (×3): 25 mg via ORAL

## 2021-04-08 MED ORDER — albuterol (PROVENTIL) 90 mcg/actuation inhaler 2 puff
90 | RESPIRATORY_TRACT | Status: AC | PRN
Start: 2021-04-08 — End: 2021-04-15
  Administered 2021-04-08 – 2021-04-10 (×3): 2 via RESPIRATORY_TRACT

## 2021-04-08 MED ORDER — hydrOXYzine HCL (ATARAX) tablet 25 mg
25 | Freq: Four times a day (QID) | ORAL | Status: AC | PRN
Start: 2021-04-08 — End: 2021-04-09
  Administered 2021-04-08 – 2021-04-09 (×5): 25 mg via ORAL

## 2021-04-08 MED ORDER — azaTHIOprine (IMURAN) tablet 100 mg
50 | Freq: Every day | ORAL | Status: AC
Start: 2021-04-08 — End: 2021-04-13
  Administered 2021-04-09 – 2021-04-13 (×5): 100 mg via ORAL

## 2021-04-08 MED ORDER — vancomycin (VANCOCIN) 1,500 mg in sodium chloride 0.9 % 250 mL IVPB
Freq: Three times a day (TID) | INTRAVENOUS | Status: AC
Start: 2021-04-08 — End: 2021-04-08

## 2021-04-08 MED ORDER — cholestyramine-aspartame (PREVALITE) 4 gram packet PwPk 4 g
4 | Freq: Every day | ORAL | Status: AC
Start: 2021-04-08 — End: 2021-04-09
  Administered 2021-04-08 – 2021-04-09 (×2): 4 g via ORAL

## 2021-04-08 MED FILL — PREDNISONE 20 MG TABLET: 20 20 MG | ORAL | Qty: 2

## 2021-04-08 MED FILL — PREVALITE 4 GRAM POWDER FOR SUSP IN A PACKET: 4 4 gram | ORAL | Qty: 1

## 2021-04-08 MED FILL — VANCOMYCIN 1,000 MG INTRAVENOUS INJECTION: 1000 1000 mg | INTRAVENOUS | Qty: 1500

## 2021-04-08 MED FILL — OXYCODONE 5 MG TABLET: 5 5 MG | ORAL | Qty: 2

## 2021-04-08 MED FILL — VENTOLIN HFA 90 MCG/ACTUATION AEROSOL INHALER: 90 90 mcg/actuation | RESPIRATORY_TRACT | Qty: 8

## 2021-04-08 MED FILL — HYDROXYZINE HCL 25 MG TABLET: 25 25 MG | ORAL | Qty: 1

## 2021-04-08 MED FILL — HEPARIN (PORCINE) 5,000 UNIT/ML INJECTION SOLUTION: 5000 5,000 unit/mL | INTRAMUSCULAR | Qty: 1

## 2021-04-08 MED FILL — PRENATAL PLUS (CALCIUM CARBONATE) 27 MG IRON-1 MG TABLET: 27 27 mg iron- 1 mg | ORAL | Qty: 1

## 2021-04-08 MED FILL — LEVOTHYROXINE 112 MCG TABLET: 112 112 MCG | ORAL | Qty: 1

## 2021-04-08 MED FILL — HYDROCHLOROTHIAZIDE 25 MG TABLET: 25 25 MG | ORAL | Qty: 1

## 2021-04-08 MED FILL — AZATHIOPRINE 50 MG TABLET: 50 50 mg | ORAL | Qty: 2

## 2021-04-08 MED FILL — OXYCODONE 5 MG TABLET: 5 5 MG | ORAL | Qty: 1

## 2021-04-08 MED FILL — DIPHENHYDRAMINE 25 MG CAPSULE: 25 25 mg | ORAL | Qty: 1

## 2021-04-08 MED FILL — VANCOMYCIN 1,000 MG INTRAVENOUS INJECTION: 1000 1000 mg | INTRAVENOUS | Qty: 2500

## 2021-04-08 NOTE — Unmapped (Signed)
Occupational Therapy  Initial Assessment     Name: Tracy Watkins  DOB: Apr 11, 1996  Attending Physician: Georgeann Oppenheim, MD  Admission Diagnosis: HEPATITIS  Date: 04/08/2021  Room: CD3227/CD3227  Reviewed Pertinent hospital course: Yes    Hospital Course PT/OT: 24 F CC:  , presenting to the hospital with abdominal pain and elevated liver enzymes 7/26 US liver doppler: Patent hepatic vasculature with antegrade flow.; [place] abd: Cirrhotic morphology of the liver; US transvaginal: Bilateral enlarged ovaries with thecal lutein cysts consistent with the history of ovarian stimulation. Foci on the left most likely represent hemorrhagic cysts  Relevant PMH : cirrhosis d/b ascites and non-bleeding EV PCOS and h/o Cirrhosis d/t Autoimmune Hepatitis  Activity Level: Activity as tolerated    Aide:     Recommendation  Recommendation: Home with PRN assist  Equipment Recommendations: None    Assessment/Goals/Plan  Pt with no skilled acute occupational therapy needs, therefore no occupational therapy goals were established. Discharge patient from inpatient occupational therapy.       Pt stated goal to go home.      Outcome Measures  AM-PAC 6 Clicks Daily Activity Inpatient Short Form: OT 6 Clicks Score: 24    Home Living/Prior Function  Patient able to provide accurate information at this time: Yes  Lives With: Spouse  Assistance available: 24 hour supervision  Type of Home: House  Home Entry: No steps to enter  Bathroom Shower/Tub: Walk-in shower  Home Equipment: None    Prior Function  Functional Mobility: Independent ( no assistive device)  ADL Assistance: Intermittent assistance needed  Needs assistance with: Dressing  IADL Assistance: Intermittent assistance needed     Pain  Pain Score: 7   Pain Location: Hip  Pain Descriptors: Aching  Pain Intervention(s): Repositioned  Therapist reported pain to: RN    Cognition  Overall Cognitive Status: Within Functional Limits  Cognitive Assessment: Arousal/ Alertness;Orientation  Level;Behavior;Following Commands  Arousal/Alertness: Alert  Orientation Level: Oriented X4  Behavior: Appropriate;Cooperative  Following Commands: Follows all commands and directions without difficulty    Vision  Overall Vision/ Perception: Impaired  Impairments: Basic Vision  Current Vision: Wears glasses all the time       Right Upper Extremity   Right UE ROM: Grossly WFL as observed during functional activities  Right UE Strength: Grossly WFL (at least 3+/5) as observed during functional activities  Right UE Muscle Tone: Normal  Right Hand Function: Grossly WFL as observed during functional activity         Left Upper Extremity  Left UE ROM: Grossly WFL as observed during functional activities  Left UE Strength: Grossly WFL (at least 3+/5) as observed during functional activities  Left UE Muscle Tone: Normal  Left UE Hand Function: Grossly WFL as observed during functional activites         Neuromuscular  Overall Sensation: Within Functional Limits          Functional Mobility  Bed Mobility   Supine to Sit: Independent  Sit to Supine: Independent  Transfers  Sit to Stand: Independent  Functional Mobility: Independent (Pt ambulated household distance into hall independently with no LOB)  Balance  Sitting - Static: Independent  Sitting-Dynamic: Independent   Standing-Static: Independent  Standing-Dynamic: Independent    ADL  Upper Body Dressing : Independent  Upper Body Dressing Deficit Additional Comments: to donn jacket  Lower Body Dressing: Independent  Lower Body Dressing Deficit: Don/doff L sock;Don/doff R sock;Don/doff R shoe;Don/doff L shoe  Additional Comments: Pt educated on sleeping  positions to reduce hip pain. Pt verbalized understanding         Position after Treatment/Safety Handoff  Position after therapy session: Bed  Details: RN notified;Call light/ needs within reach  Alarms: Bed  Alarms Status: Activated and Interfaced with call system    Plan  Plan  Progress: Discontinue OT  OT Frequency:  One-time visit--discharge from OT    The plan of care and recommendations assesses the patient's and/or caregiver's readiness, willingness, and ability to provide or support functional mobility and ADL tasks as needed upon discharge.      Patient/Family Education  Educated patient on the role of occupational therapy, OT goals, OT plan of care, discharge recommendation, ADL training and functional mobility training and fall prevention strategies including need for supervision/ assistance with OOB activity and use of call light. patient  verbalized understanding and demonstrated understanding.    OT Time  Start Time: 1344  Stop Time: 1358  Time Calculation (min): 14 min    OT Charges  $OT Evaluation Low Complex 30 Min: 1 Procedure              Problem List  Patient Active Problem List   Diagnosis   ??? Pre-liver transplant, listed   ??? Autoimmune hepatitis (CMS Dx)   ??? Cirrhosis of liver (CMS Dx)   ??? Other acne   ??? PCOS (polycystic ovarian syndrome)   ??? Hyperandrogenemia   ??? Hyperandrogenism   ??? Infertility, anovulation   ??? Class 3 severe obesity due to excess calories without serious comorbidity in adult (CMS Dx)   ??? COVID      Past Medical History  Past Medical History:   Diagnosis Date   ??? Acne    ??? Amenorrhea    ??? 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 Bilateral leg edema    ??? Cirrhosis (CMS Dx)    ??? Dysmenorrhea    ??? Esophageal varices (CMS Dx) 05/2016    Grade 1/small, no history of bleeding   ??? H/O infertility    ??? PCOS (polycystic ovarian syndrome)      Past Surgical History  Past Surgical History:   Procedure Laterality Date   ??? ESOPHAGOGASTRODUODENOSCOPY N/A 02/05/2021    Procedure: ESOPHAGOGASTRODUODENOSCOPY WITH MAC;  Surgeon: Wallene Dales, MD;  Location: [facility] ENDOSCOPY;  Service: Gastroenterology;  Laterality: N/A;   ??? ESOPHAGOGASTRODUODENOSCOPY N/A 02/05/2021    Procedure: EGD WITH BIOPSY;  Surgeon: Wallene Dales, MD;  Location: [facility] ENDOSCOPY;  Service: Gastroenterology;  Laterality: N/A;   ??? LIVER BIOPSY      ??? UPPER GASTROINTESTINAL ENDOSCOPY

## 2021-04-08 NOTE — Unmapped (Addendum)
[place]  Case Management/Social Work Department  Discharge Planning Screen    Screening Questions     Do you need help filling out medical forms: No  Is patient from anywhere other than a private residence? (shelter, SNF, LTC, IPR, LTAC, etc.): No  Do you have any services that come into the house to help you? (COA, private duty, HHC, etc): No  Do you have barriers getting to follow up appointment or obtaining prescriptions?: No  Have you been to the (ED/hospital) 4x times in the past 6 months? : No    CM has completed a chart review and completed rounds with the team. Per team, the pt is not medically ready for D/C today. Patient's discharge needs were assessed and no additional needs were identified. Patient likely to discharge back to prior living arrangements with no additional services. CM to follow up if any needs arise at d/c.     Discharge Plan      Anticipated discharge plan: Home with no needs                Anticipated discharge date:  07/29     CM/SW will continue to follow and remain available for discharge planning needs.       Rosine Door  RN Case Manager  [phone]

## 2021-04-08 NOTE — Unmapped (Signed)
HEPATOLOGY PROGRESS NOTE    ID: Amadea Keagy is a 25 y.o. 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 States that symptoms of dizziness, jaundice started about 1.5 - 2 weeks ago, shortly after the start of starting injection as part of an In vitro fertilization protocol. On arrival liver enzymes were notable for AlkP of 106, AST/ALT of 623/463 and tbili 13.8, having all been WNL on 12/16/20. Started on Prednisone 40mg  on 7/26.    Interim:  -LFTs improving slightly  -TVUS with cysts consistent with ovarian stimulation  -Gyn saw patient, low concern for OHSS  -Feeling better today. Able to eat. Still has some abdominal pain    ROS:  10 pt ROS negative unless otherwise noted above    Vitals:  Temp:  [97.7 ??F (36.5 ??C)-98.1 ??F (36.7 ??C)] 98.1 ??F (36.7 ??C)  Heart Rate:  [94-115] 94  Resp:  [15-25] 16  BP: (110-128)/(61-84) 116/66    Intake/Output Summary (Last 24 hours) at 04/08/2021 0644  Last data filed at 04/07/2021 1517  Gross per 24 hour   Intake 10 ml   Output --   Net 10 ml       Physical Exam   Gen: Well-developed, well nourished. No acute distress.   HEENT: NC/AT, EOMI, moist mucus membranes  CV: RRR, no m/r/g, no LE edema  Lungs: Clear to auscultation bilaterally.   Abdomen: +BS, soft, mild tenderness worse in RUQ  Extremities: warm, well perfused. Bilateral pitting edema of legs  Neuro: A&Ox3, moving all extremities spontaneous, no asterixis   Skin: no bruising/rashes/jaundice  Psych: normal affect    MELD-Na score: 25 at 04/07/2021 11:56 PM  Calculated from:  Serum Creatinine: 0.70 mg/dL (Using min of 1 mg/dL) at 9/81/1914 78:29 PM  Serum Sodium: 133 mmol/L at 04/07/2021 11:56 PM  Total Bilirubin: 12.9 mg/dL at 5/62/1308 65:78 PM  INR(ratio): 1.8 at 04/07/2021 11:56 PM  Age: 51 years        Assessment/Plan:   Eliyana Pagliaro is a 25 y.o. 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  ??  ??  #Elevated liver enzymes, history of AIH: significant, acute elevation in liver enzymes coincides with recent IVF treatment about 2 weeks ago. While flare of autoimmune hepatitis is high on the differential, it is also possible that elevations are related to Ovarian hyperstimulation syndrome with a more severe presentation given her underlying cirrhosis. Gyn has less concern for this. Infectious etiology also need to be ruled out however are lower on the differential  ??  Recommendations:  -Hepatitis serologies               HAV immune              HBV Immune              HCV Ab: negative  -CMV negative  -UDS unrevealing  -Hepatitis E has been ordered.  -IgG: 1949 (down from last check at 2450 in 2018)  -Continue Prednisone 40mg  daily        Samson Frederic, MD  Gastroenterology/Hepatology Fellow- PGY 6  Pager [phone]

## 2021-04-08 NOTE — Unmapped (Signed)
[place] [facility]: Vancomycin Monitoring Consult       Tracy Watkins is a 25 y.o. female currently being treated for bacteremia.     Patient is allergic to latex, penicillins, and nsaids (non-steroidal anti-inflammatory drug).    Pharmacy consulted for vancomycin management by Dr. Nelta Numbers Wards Team.      Current Anti-Infectives       Dose Frequency Start End    vancomycin (VANCOCIN) 1,500 mg in sodium chloride 0.9 % 250 mL IVPB 15 mg/kg ?? 99.8 kg Every 8 hours 04/08/2021     Admin Instructions: Maintenance dose to follow Loading dose    Route: Intravenous    Linked Group 1: Followed by Linked Group Details        vancomycin (VANCOCIN) 2,500 mg in sodium chloride 0.9 % 500 mL IVPB 25 mg/kg ?? 99.8 kg Once 04/08/2021 04/09/2021    Admin Instructions: Contact pharmacy if there is a question/concern of whether vancomycin should be given based on serum drug levels.    Route: Intravenous    Linked Group 1: Followed by Linked Group Details            documented within (last 72 hours)     Date/Time Action Medication Dose Rate    04/07/21 0414 New Bag    vancomycin (VANCOCIN) 2,500 mg in sodium chloride 0.9 % 500 mL IVPB 2,500 mg 200 mL/hr          --Objective Data--     Vitals:    04/07/21 2155 04/08/21 0021 04/08/21 0415 04/08/21 0712   BP: 115/68 123/74 116/66 109/57   Pulse: 107 105 94 93   Resp: 18 16 16 18    Temp: 97.8 ??F (36.6 ??C) 97.9 ??F (36.6 ??C) 98.1 ??F (36.7 ??C) 98.1 ??F (36.7 ??C)   TempSrc: Oral Oral Oral Oral   SpO2: 100% 100% 96% 97%   Weight:           I/O last 3 completed shifts:  In: 1110 [I.V.:10; IV Piggyback:1100]  Out: -     WBC, BUN, Creatinine  (Last 7 days)      Today 0743 Yesterday 2356 Yesterday 1745    WBC             3.4                     --                     --            BUN             --                     16                     18            Creatinine             --                     0.70                     0.73                  Ideal body weight: 130 lb  11.7 oz (59.3 kg)  Adjusted ideal body weight: 166 lb 7 oz (  75.5 kg)  Estimated CrCl: >120 mL/min (stable)    --Cultures--     Microbiology Results     Date and Time Order Name Sensitivity Status Organisms Specimen ID Source    04/08/2021  8:49 AM #1  Blood culture-Peripheral site 1  In process  [id number] Peripheral    04/07/2021  2:30 AM BC Rapid Molecular ID - Gram Positive Panel  In process  [id number] Peripheral    04/07/2021  2:30 AM #2  Blood culture-Peripheral site 2  Preliminary  [id number] Peripheral    04/07/2021  2:30 AM #1  Blood culture-Peripheral site 1  Preliminary  [id number] Peripheral          --Vancomycin Concentrations--     Lab Results  (Last 7 days)         No relevant labs found             --Assessment and Plan--        Patient is a 25 y.o. female being treated with vancomycin for bacteremia (gram + cocci in clusters).    ?? Recommend to re-load the patient with vancomycin 2500mg  (25mg /kg) x1 as the last vancomycin dose was given over 24 hours ago and recommend to adjust the maintenance regimen from vancomycin 1500mg  (15mg /kg) IV daily to 1500mg  (15mg /kg) IV TID given stable renal function and patient's age.   ?? Will check vancomycin serum concentration prior to 4th dose of current regimen.  ?? Goal vancomycin trough of 15-20 mg/L. Pending speciation, will consider adjusting to AUC monitoring.  ?? Recommend to continue trending renal function daily and to avoid additional nephrotoxic agents as able.    ?? Pharmacy will continue to monitor therapy for efficacy and toxicity.       Thank you for the consult.    Nettie Elm, PharmD, BCPS  Clinical Pharmacy Specialist - Internal Medicine  Preferred Contact: Epic Secured Chat  04/08/2021  8:58 AM

## 2021-04-08 NOTE — Unmapped (Signed)
Physical Therapy  Initial Assessment and Discharge     Name: Tracy Watkins  DOB: March 21, 1996  Attending Physician: Georgeann Oppenheim, MD  Admission Diagnosis: HEPATITIS  Date: 04/08/2021  Room: CD3227/CD3227  Reviewed Pertinent hospital course: Yes    Hospital Course PT/OT: 24 F CC:  , presenting to the hospital with abdominal pain and elevated liver enzymes 7/26 US liver doppler: Patent hepatic vasculature with antegrade flow.; [place] abd: Cirrhotic morphology of the liver; US transvaginal: Bilateral enlarged ovaries with thecal lutein cysts consistent with the history of ovarian stimulation. Foci on the left most likely represent hemorrhagic cysts  Relevant PMH : cirrhosis d/b ascites and non-bleeding EV PCOS and h/o Cirrhosis d/t Autoimmune Hepatitis  Activity Level: Activity as tolerated      Assessment  Assessment: No impairments  Prognosis: Excellent  Patient is awake and agreeable to participate in therapy. Patient is limited by pain but is overall independent with functional mobility. Due to no acute needs, she is discharged from acute PT at this time.       Recommendation  Recommendation: Home with intermittent assistance, Outpatient PT (Pt would benefit from being seen at pain management clinic)  Equipment Recommended: None         Outcome Measures  AM-PAC 6 Clicks Basic Mobility Inpatient Short Form: PT 6 Clicks Score: 24          Mobility Recommendations for Staff  Patient ability: Patient ambulates in hallway  Assist needed: independently    Home Living/Prior Function  Patient able to provide accurate information at this time: Yes  Lives With: Spouse  Assistance available: 24 hour supervision  Type of Home: House  Home Entry: No steps to enter  Bathroom Shower/Tub: Walk-in shower  Home Equipment: None  Prior Function  Functional Mobility: Independent ( no assistive device)  ADL Assistance: Intermittent assistance needed  Needs assistance with: Dressing  IADL Assistance: Intermittent assistance needed      Pain  Pain Score: 7   Pain Location: Hip  Pain Descriptors: Aching  Pain Intervention(s): Repositioned  Therapist reported pain to: RN    Vision  Vision/Perception  Overall Vision/ Perception: Impaired  Impairments: Basic Vision  Current Vision: Wears glasses all the time       Cognition  Overall Cognitive Status: Within Functional Limits  Cognitive Assessment: Arousal/ Alertness;Orientation Level;Behavior;Following Commands  Arousal/Alertness: Alert  Orientation Level: Oriented X4  Behavior: Appropriate;Cooperative  Following Commands: Follows all commands and directions without difficulty    Neuromuscular  Overall Sensation: Within Functional Limits          Upper Extremity  UE Assessment: Strength WFL ( at least 3+/5) as observed during functional activity    Lower Extremity  Lower Extremity  LE Assessment: Strength WFL (at least 3+/5) as observed during functional activity          Functional Mobility  Bed Mobility   Supine to Sit: Independent  Sit to Supine: Independent  Transfers  Sit to Stand: Independent  Stand to Sit: Independent  Gait  Distance (in feet): 200'  Level of assistance: Independent  Assistive Device: None  Gait Characteristics: Steady;swing-through pattern;No LOB  Balance  Sitting - Static: Independent  Sitting-Dynamic: Independent   Standing-Static: Independent  Standing-Dynamic: Independent         Position after Therapy/Safety Handoff  Position after treatment and safety handoff  Position after therapy session: Bed (seated EOB)  Details: RN notified;Call light/ needs within reach  Alarms: Bed  Alarms Status: Not needed-patient low fall  risk or medium fall risk with other precautions in place    Goals      No PT goals established secondary to patient with no acute skilled PT needs. Patient stated goal(s) is/are to go home--addressed at time of eval. Discharge patient from inpatient PT services at this time.    Patients and/or caregivers as well as practitioners mutually agreed upon the  above goal(s)/plan.    Patient/Family Education  Educated patient on the role of physical therapy, goals, plan of care, importance of increased activity, discharge recommendations, transfer training, gait training and safety and need for supervision during OOB activity and fall prevention strategies, including need for supervision/ assistance with OOB activity and use of call light; patient verbalized understanding. Handout(s) issued: n/a.    Plan  Plan  PT Frequency: One time visit--discharge from PT    The plan of care and recommendations assesses the patient's and/or caregiver's readiness, willingness, and ability to provide or support functional mobility and ADL tasks as needed upon discharge.        Time  Start Time: 1344  Stop Time: 1358  Time Calculation (min): 14 min    Charges   $PT Evaluation Low Complex 20 Min: 1 Procedure                    Problem List  Patient Active Problem List   Diagnosis   ??? Pre-liver transplant, listed   ??? Autoimmune hepatitis (CMS Dx)   ??? Cirrhosis of liver (CMS Dx)   ??? Other acne   ??? PCOS (polycystic ovarian syndrome)   ??? Hyperandrogenemia   ??? Hyperandrogenism   ??? Infertility, anovulation   ??? Class 3 severe obesity due to excess calories without serious comorbidity in adult (CMS Dx)   ??? COVID      Past Medical History  Past Medical History:   Diagnosis Date   ??? Acne    ??? Amenorrhea    ??? 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 Bilateral leg edema    ??? Cirrhosis (CMS Dx)    ??? Dysmenorrhea    ??? Esophageal varices (CMS Dx) 05/2016    Grade 1/small, no history of bleeding   ??? H/O infertility    ??? PCOS (polycystic ovarian syndrome)       Past Surgical History  Past Surgical History:   Procedure Laterality Date   ??? ESOPHAGOGASTRODUODENOSCOPY N/A 02/05/2021    Procedure: ESOPHAGOGASTRODUODENOSCOPY WITH MAC;  Surgeon: Wallene Dales, MD;  [facility] ENDOSCOPY;  Service: Gastroenterology;  Laterality: N/A;   ??? ESOPHAGOGASTRODUODENOSCOPY N/A 02/05/2021    Procedure: EGD WITH BIOPSY;   Surgeon: Wallene Dales, MD;  Location: [facility] ENDOSCOPY;  Service: Gastroenterology;  Laterality: N/A;   ??? LIVER BIOPSY     ??? UPPER GASTROINTESTINAL ENDOSCOPY

## 2021-04-08 NOTE — Unmapped (Signed)
Attending Physician's Note:  I saw and examined the patient on 04/08/2021. I discussed with the fellow and agree with fellow's findings and plan as documented in the note. Copied / pasted information was reviewed and confirmed to be accurate. >10 point POS was performed and negative unless otherwise documented in the note. Please also see below.       25 y.o. woman with complex PMH of Autoimmune hepatitis cirrhosis and PCOS who was admitted for abdominal pain, elevated liver chemistries.     ??   Hepatology consulted for co-management of acute elevated liver chemistries.       Pt transvaginal ultrasound showed cysts suggestive of ovarian stimulation     Gynecology not concerned for ovarian hyperstimulation     Pt noted some improvement in abdomen pain     Slight improvement in AST, ALT and bilirubin       PE  Vitals:    04/08/21 0918 04/08/21 1115 04/08/21 1148 04/08/21 1544   BP:   116/59 123/70   BP Location:   Left arm Right arm   Patient Position:   Lying Lying   Pulse: 89  95 103   Resp:   18 18   Temp:   97.9 ??F (36.6 ??C) 97.9 ??F (36.6 ??C)   TempSrc:   Oral Oral   SpO2: 100% 99% 97% 99%   Weight:         General: awake, alert, no apparent distress   HENT: sclera anicteric, moist mucous membranes   Neck: supple    Pulmonary: good air exchange, bilateral breath sounds    Cardiac: S1 and S2 heard    Abdomen:soft, + tender, non-distended stomach, normoactive bowel sounds, no fluid wave, no guarding or rebound, no hepatomegaly, no splenomegaly    Neuro: awake, alert, oriented x 3, no confusion   Extremities: no pedal edema    Skin: + jaundice     MELD-Na score: 24 at 04/08/2021  3:12 PM  MELD score: 23 at 04/08/2021  3:12 PM  Calculated from:  Serum Creatinine: 0.65 mg/dL (Using min of 1 mg/dL) at 0/98/1191  4:78 AM  Serum Sodium: 135 mmol/L at 04/08/2021  7:26 AM  Total Bilirubin: 12.9 mg/dL at 2/95/6213  0:86 PM  INR(ratio): 1.9 at 04/08/2021 11:31 AM  Age: 58 years    A&P  25 y.o. year old female with complex medical  issues as detailed in HPI    Acute elevated liver chemistries: predominant hepatocellular injury with bilirubin up to 14 and INR 1.9 in setting of autoimmune hepatitis cirrhosis. Concern for autoimmune hepatitis flare, triggered by ovarian stimulation.  Negative viral hepatitis serologies and no evidence of perihepatic vascular thrombosis.  04/08/2021 liver chemistries trended down   - continue prednisone 40 mg daily   - continue azathioprine 100 mg daily   - appreciate gyn input  - pain control as needed   - monitor liver chemistries and INR daily     This note was completely edited, written and reviewed by me and consists of information cut and pasted from the my most recent visit, my smart phrases and other Epic tools. I have personally reviewed all aspects of this note to at least include reviewing this patient's chart and problem list, updating the history, physical exam, lab and procedure results, and assessment and plan as detailed above and below.  As such this visit note reflects my current evaluation and management for this patient.    I have reviewed all prior notes, labs, imaging  and path reports. I have reviewed interval / recent lab work including: CBC, renal, hepatic, INR, viral studies,  and all other labs for digestive disease relating to their medical condition. I have independently reviewed the patients recent radiologic imaging including any CT scans, MRI, ultrasound or endoscopic procedures. Finally, I have reviewed independently any interval GI and liver pathology.    Rennie Natter, MD, MPH  Transplant hepatology   Pager: [phone]

## 2021-04-08 NOTE — Unmapped (Signed)
Internal Medicine - Updated Plan    Patient was discussed with the night resident and seen on rounds with the attending physician. Please see H&P for further details.         Latest Reference Range & Units 04/07/21 17:45 04/07/21 23:56   Protime 12.1 - 15.1 seconds 20.5 (H) 21.8 (H)   INR 0.9 - 1.1  1.7 (H) 1.8 (H)   (H): Data is abnormally high       Latest Reference Range & Units 04/07/21 17:45 04/07/21 23:56   Alkaline Phosphatase 36 - 125 U/L 96 85   AST (SGOT) 13 - 39 U/L 461 (H) 376 (H)   ALT (SGPT) 7 - 52 U/L 414 (H) 359 (H)   Albumin 3.5 - 5.7 g/dL 2.9 (L) 2.8 (L)   Protein, Total 6.4 - 8.9 g/dL 6.3 (L) 5.7 (L)   Bili, Total 0.0 - 1.5 mg/dL 16.1 (H) 09.6 (H)   (H): Data is abnormally high  (L): Data is abnormally low          Assessment & Plan     Tracy Watkins is a 25 y.o. female with PCOS, Cirrhosis 2/2 Autoimmune Hepatitis, is being admitted to the hospital for evaluation of elevated liver enzymes, acute abdominal pain.  ??  ??  #Elevated Liver Enzymes, history of Autoimmune Hepatitis  - Acute abdominal pain and significant elevation in liver enzymes seems to coincide with IVF treatment which began 2wks ago. Concern for Ovarian Hyperstimulation Syndrome vs acute flare of Autoimmune Hepatitis. Possible that pt's underlying cirrhosis may be causing a more severe presentation of OHSS. Also considering drug associated hepatic injury, given extensive history of medications recently received as a part of IVF protocol. Prothrombic state associated with IVF could also raise concern for thromboembolization such as Budd-Chiari; though imaging does not seem to suggest that at this time. Infectious etiology should also be ruled out, however this is much lower on the differential.  - Follow-up on Infectious workup: HepE, CMV, EBV  - Gyn consulted; appreciate recs  - Liver consulted; appreciate recs:      - Continue Prednisone 40 daily     Status: INR 1.7 > 1.8. Having increased chest pain, tightness this am. States  she's having some difficulty breathing. Has been persistently tachycardic in the low 100-110s. On continuous tele without changes. Will continue to monitor. Plan to advance diet pending am INR.    - Hep B: immune  - Hep A, C: nonreactive  - COVID: negative  - UDS: positive only for opiates    - Due to significant history of cirrhosis, autoimmune hepatitis and acute elevation of INR, liver enzymes, will closely monitor patient at this time.  - q2h Mental Status Checks  - q6h  INR  - If any abnormalities or acutely worsening clinical picture, have low threshold to contact team/GI attending    ??  Code Status: Full  DVT Prophylaxis: SubQ Hep  Nutrition: Clear Liquids  Disposition Plan: GI Floor  ??  ??    Arvid Right, MD  PGY1 Internal Medicine  Pager: [phone]  04/08/2021, 7:28 AM

## 2021-04-08 NOTE — Unmapped (Signed)
Critical lab notification:  Blood cultures x 2 yesterday, 1 is growing gm + cocci/

## 2021-04-08 NOTE — Unmapped (Signed)
PT/OT at bedside.

## 2021-04-09 ENCOUNTER — Encounter

## 2021-04-09 LAB — HEPATIC FUNCTION PANEL
ALT: 325 U/L (ref 7–52)
ALT: 342 U/L (ref 7–52)
AST: 283 U/L (ref 13–39)
AST: 298 U/L (ref 13–39)
Albumin: 2.6 g/dL (ref 3.5–5.7)
Albumin: 2.8 g/dL (ref 3.5–5.7)
Alkaline Phosphatase: 91 U/L (ref 36–125)
Alkaline Phosphatase: 104 U/L (ref 36–125)
Bilirubin, Direct: 6.2 mg/dL (ref 0.00–0.40)
Bilirubin, Direct: 6.92 mg/dL (ref 0.00–0.40)
Bilirubin, Indirect: 5.9 mg/dL (ref 0.00–1.10)
Bilirubin, Indirect: 6.28 mg/dL (ref 0.00–1.10)
Total Bilirubin: 12.1 mg/dL (ref 0.0–1.5)
Total Bilirubin: 13.2 mg/dL (ref 0.0–1.5)
Total Protein: 5.5 g/dL (ref 6.4–8.9)
Total Protein: 5.9 g/dL (ref 6.4–8.9)

## 2021-04-09 LAB — CBC
Hematocrit: 26.8 % (ref 35.0–45.0)
Hemoglobin: 9.6 g/dL (ref 11.7–15.5)
MCH: 39.4 pg (ref 27.0–33.0)
MCHC: 35.7 g/dL (ref 32.0–36.0)
MCV: 110.4 fL (ref 80.0–100.0)
MPV: 7.4 fL (ref 7.5–11.5)
Platelets: 100 10*3/uL (ref 140–400)
RBC: 2.43 10*6/uL (ref 3.80–5.10)
RDW: 23.6 % (ref 11.0–15.0)
WBC: 2.3 10*3/uL (ref 3.8–10.8)

## 2021-04-09 LAB — RENAL FUNCTION PANEL W/EGFR
Albumin: 2.6 g/dL (ref 3.5–5.7)
Albumin: 2.7 g/dL (ref 3.5–5.7)
Anion Gap: 5 mmol/L (ref 3–16)
Anion Gap: 7 mmol/L (ref 3–16)
BUN: 11 mg/dL (ref 7–25)
BUN: 10 mg/dL (ref 7–25)
CO2: 29 mmol/L (ref 21–33)
CO2: 24 mmol/L (ref 21–33)
Calcium: 8 mg/dL (ref 8.6–10.3)
Calcium: 8.6 mg/dL (ref 8.6–10.3)
Chloride: 103 mmol/L (ref 98–110)
Chloride: 102 mmol/L (ref 98–110)
Creatinine: 0.59 mg/dL (ref 0.60–1.30)
Creatinine: 0.54 mg/dL (ref 0.60–1.30)
EGFR: >90
EGFR: >90
Glucose: 74 mg/dL (ref 70–100)
Glucose: 111 mg/dL (ref 70–100)
Osmolality, Calculated: 282 mOsm/kg (ref 278–305)
Osmolality, Calculated: 276 mOsm/kg (ref 278–305)
Phosphorus: 2.4 mg/dL (ref 2.1–4.7)
Phosphorus: 2 mg/dL (ref 2.1–4.7)
Potassium: 3.5 mmol/L (ref 3.5–5.3)
Potassium: 3.7 mmol/L (ref 3.5–5.3)
Sodium: 137 mmol/L (ref 133–146)
Sodium: 133 mmol/L (ref 133–146)

## 2021-04-09 LAB — DIFFERENTIAL
Basophils Absolute: 0 /uL (ref 0–200)
Basophils Relative: 0 % (ref 0.0–1.0)
Eosinophils Absolute: 44 /uL (ref 15–500)
Eosinophils Relative: 1.9 % (ref 0.0–8.0)
Hypochromasia: INCREASED
Lymphocytes Absolute: 658 /uL (ref 850–3900)
Lymphocytes Relative: 28.6 % (ref 15.0–45.0)
Macrocytosis: INCREASED
Monocytes Absolute: 110 /uL (ref 200–950)
Monocytes Relative: 4.8 % (ref 0.0–12.0)
Neutrophils Absolute: 1488 /uL (ref 1500–7800)
Neutrophils Relative: 64.7 % (ref 40.0–80.0)
PLT Morphology: NORMAL
nRBC: 2 /100 WBC (ref 0–0)

## 2021-04-09 LAB — PROTIME-INR
INR: 1.7 (ref 0.9–1.1)
Protime: 20.4 seconds (ref 12.1–15.1)

## 2021-04-09 LAB — MAGNESIUM
Magnesium: 2.1 mg/dL (ref 1.5–2.5)
Magnesium: 2 mg/dL (ref 1.5–2.5)

## 2021-04-09 LAB — THYROID FUNCTION CASCADE: TSH: <0.02 u[IU]/mL (ref 0.45–4.12)

## 2021-04-09 LAB — T4, FREE: Free T4: 1.56 ng/dL (ref 0.61–1.76)

## 2021-04-09 LAB — T3: T3, Total: 117.4 ng/dL (ref 60.0–220.0)

## 2021-04-09 MED ORDER — cholestyramine-aspartame (PREVALITE) 4 gram packet PwPk 4 g
4 | Freq: Two times a day (BID) | ORAL | Status: AC
Start: 2021-04-09 — End: 2021-04-15
  Administered 2021-04-10 – 2021-04-15 (×10): 4 g via ORAL

## 2021-04-09 MED FILL — PREVALITE 4 GRAM POWDER FOR SUSP IN A PACKET: 4 4 gram | ORAL | Qty: 1

## 2021-04-09 MED FILL — PREDNISONE 20 MG TABLET: 20 20 MG | ORAL | Qty: 2

## 2021-04-09 MED FILL — AZATHIOPRINE 50 MG TABLET: 50 50 mg | ORAL | Qty: 2

## 2021-04-09 MED FILL — DIPHENHYDRAMINE 25 MG CAPSULE: 25 25 mg | ORAL | Qty: 1

## 2021-04-09 MED FILL — LEVOTHYROXINE 112 MCG TABLET: 112 112 MCG | ORAL | Qty: 1

## 2021-04-09 MED FILL — HEPARIN (PORCINE) 5,000 UNIT/ML INJECTION SOLUTION: 5000 5,000 unit/mL | INTRAMUSCULAR | Qty: 1

## 2021-04-09 MED FILL — PRENATAL PLUS (CALCIUM CARBONATE) 27 MG IRON-1 MG TABLET: 27 27 mg iron- 1 mg | ORAL | Qty: 1

## 2021-04-09 MED FILL — HYDROXYZINE HCL 25 MG TABLET: 25 25 MG | ORAL | Qty: 1

## 2021-04-09 MED FILL — OXYCODONE 5 MG TABLET: 5 5 MG | ORAL | Qty: 2

## 2021-04-09 MED FILL — VENTOLIN HFA 90 MCG/ACTUATION AEROSOL INHALER: 90 90 mcg/actuation | RESPIRATORY_TRACT | Qty: 8

## 2021-04-09 MED FILL — OXYCODONE 5 MG TABLET: 5 5 MG | ORAL | Qty: 1

## 2021-04-09 MED FILL — HYDROCHLOROTHIAZIDE 25 MG TABLET: 25 25 MG | ORAL | Qty: 1

## 2021-04-09 NOTE — Unmapped (Signed)
[place]  Case Management/Social Work Department  Progress Note    Patient Information     Hospital day: 2  Inpatient/Observation:  Inpatient   Level of Care:  Floor status  Admit date:  04/07/2021  Admission diagnosis: HEPATITIS    PMH:  has a past medical history of Acne, Amenorrhea, Autoimmune hepatitis (CMS Dx) (03/2016), Bilateral leg edema, Cirrhosis (CMS Dx), Dysmenorrhea, Esophageal varices (CMS Dx) (05/2016), H/O infertility, and PCOS (polycystic ovarian syndrome).    PCP:  Faylene Million, DO    Home Pharmacy:    [facility] - [place], [place] - [address]  [address]  [address]  [place] [address]  Phone: [phone]     [facility] [place], [place] - [address]  [address] [place] [address]  Phone: [phone]         Medical Insurance Coverage:  Payor: [phone] TRICARE / Plan: [facility] MILITARY TRICARE / Product Type: *No Product type* /     Other Pertinent Information     CM has completed a chart review and completed rounds with the team. Per team, the pt is not medically ready for D/C today. Team advised, liver labs slowly improving. Team advised, pending liver labs continuing to improve pt may need a liver biopsy.     Discharge Plan     Anticipated discharge plan:  Home with no needs     Anticipated discharge date:  07/29     CM/SW will continue to follow and remain available for discharge planning needs.      Sol Passer, RN     Cell [phone]

## 2021-04-09 NOTE — Unmapped (Signed)
Team en route to assess pt at this time.

## 2021-04-09 NOTE — Unmapped (Signed)
HEPATOLOGY PROGRESS NOTE    ID: Tracy Watkins is a 25 y.o. 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 States that symptoms of dizziness, jaundice started about 1.5 - 2 weeks ago, shortly after the start of starting injection as part of an In vitro fertilization protocol. On arrival liver enzymes were notable for AlkP of 106, AST/ALT of 623/463 and tbili 13.8, having all been WNL on 12/16/20. Started on Prednisone 40mg  on 7/26.    Interim:  -LFTs continue gradual improvement  -Pain somewhat better though still present in RUQ and epigastrium      ROS:  10 pt ROS negative unless otherwise noted above    Vitals:  Temp:  [97.7 ??F (36.5 ??C)-98.3 ??F (36.8 ??C)] 97.7 ??F (36.5 ??C)  Heart Rate:  [85-117] 85  Resp:  [18] 18  BP: (110-132)/(48-71) 110/63  No intake or output data in the 24 hours ending 04/09/21 1756    Physical Exam   Gen: Well-developed, well nourished. No acute distress.   HEENT: NC/AT, EOMI, moist mucus membranes  CV: RRR, no m/r/g, no LE edema  Lungs: Clear to auscultation bilaterally.   Abdomen: +BS, soft, mild tenderness worse in RUQ  Extremities: warm, well perfused. Bilateral pitting edema of legs  Neuro: A&Ox3, moving all extremities spontaneous, no asterixis   Skin: no bruising/rashes/jaundice  Psych: normal affect    MELD-Na score: 22 at 04/09/2021  7:21 AM  Calculated from:  Serum Creatinine: 0.59 mg/dL (Using min of 1 mg/dL) at 1/61/0960  4:54 AM  Serum Sodium: 137 mmol/L at 04/09/2021  5:53 AM  Total Bilirubin: 12.1 mg/dL at 0/98/1191  4:78 AM  INR(ratio): 1.7 at 04/09/2021  7:21 AM  Age: 38 years        Assessment/Plan:   Tracy Watkins is a 25 y.o. 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  ??  ??  #Elevated liver enzymes, history of AIH: significant, acute elevation in liver enzymes coincides with recent IVF  treatment about 2 weeks ago. While flare of autoimmune hepatitis is high on the differential, it is also possible that elevations are related to Ovarian hyperstimulation syndrome with a more severe presentation given her underlying cirrhosis. Gyn has less concern for this. Infectious etiology also need to be ruled out however are lower on the differential  ??  Recommendations:  -Continue Prednisone 40mg  daily  -Continue Imuran 100mg   -Trend LFTs  -Discharge once symptoms improved and eating without need for pain medication. Will continue on this current regimen as outpatient with weekly lab checks until follow up in hepatology clinic.  -Discussed with patient that liver biopsy is no longer needed given improvement in LFTs.        Samson Frederic, MD  Gastroenterology/Hepatology Fellow- PGY 6  Pager [phone]

## 2021-04-09 NOTE — Unmapped (Signed)
Clinical Pharmacy Service: Vancomycin Consult Progress Note    Patient has been transitioned off of vancomycin therapy per team notes and orders. Pharmacy will sign-off.    Thank you for the consult,  Tracy Watkins, PharmD, BCPS  Clinical Pharmacy Specialist - Internal Medicine  Preferred Contact: Epic Secured Chat  04/09/2021  8:02 AM  -------------------------------------------------------------------------------------------------------------------      Vitals     Allergies   Allergen Reactions   ??? Latex Rash     Other reaction(s): Rash, urticarial  hives  hives     ??? Penicillins Rash   ??? 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     Wt Readings from Last 3 Encounters:   04/07/21 220 lb (99.8 kg)   02/05/21 (!) 230 lb (104.3 kg)   01/23/21 (!) 239 lb 9.6 oz (108.7 kg)     Ht Readings from Last 3 Encounters:   02/05/21 5' 6 (1.676 m)   01/23/21 5' 6 (1.676 m)   07/25/20 5' 6 (1.676 m)       Temp:  [97.9 ??F (36.6 ??C)-98.3 ??F (36.8 ??C)] 98.1 ??F (36.7 ??C)  Heart Rate:  [89-111] 111  Resp:  [18] 18  BP: (111-132)/(48-71) 111/58  No intake or output data in the 24 hours ending 04/09/21 0802    Laboratory Data and Imagining         Lab 04/09/21  0553 04/08/21  0743 04/07/21  0149   WBC 2.3* 3.4* 1.4*   HEMOGLOBIN 9.6* 11.6* 10.9*   HEMATOCRIT 26.8* 33.4* 30.2*   MEAN CORPUSCULAR VOLUME 110.4* 111.9* 108.1*   PLATELETS 100* 97* 195         Lab 04/09/21  0553 04/08/21  0726 04/07/21  2356 04/07/21  1745   SODIUM 137 135 133 132*   POTASSIUM 3.5 4.5 3.6 4.1   CHLORIDE 103 103 102 100   CO2 29 23 24 24    BUN 11 11 16 18    CREATININE 0.59* 0.65 0.70 0.73   GLUCOSE 74 83 164* 193*   CALCIUM 8.0* 8.3* 7.5* 7.9*   MAGNESIUM 2.1 2.4  --   --    PHOSPHORUS 2.4  --   --   --        Cultures     Lab Results   Component Value Date    LABGRAM Gram Positive Cocci In Clusters 04/07/2021    LABGRAM  04/07/2021     The critical result was called to, and read back by, licensed  caregiver Oscar La at 07:50 on 04-08-2021 by CGS.;      No results found for: [facility]  Lab Results   Component Value Date    ISO1 No Growth To Date 04/08/2021    LABGRAM Gram Positive Cocci In Clusters 04/07/2021    LABGRAM  04/07/2021     The critical result was called to, and read back by, licensed caregiver Oscar La at 07:50 on 04-08-2021 by CGS.;        Pharmacokinetics     No results for input(s): VANCOTROUGH, VANCORANDOM in the last 72 hours.]

## 2021-04-09 NOTE — Unmapped (Signed)
Department of Internal Medicine  Daily Progress Note      Chief Complaint / Reason for Follow-up     Tracy Watkins is a 25 y.o. female on hospital day 1. The principal reason for today's follow up visit is Elevated liver enzymes.    Interval History / Subjective     Pt continues to have severe itchiness. Atarax, Benadryl have been unsuccessful in providing relief. Abdominal pain, nausea improved from prior days. Pt is able to eat without issue.    ROS:   General: No fever, chills  Eyes: No vision changes  Cardiovascular: No chest pain, palpitations  Lungs: No SOB, cough  GI: No n/v, constipation, diarrhea; + epigastric abdominal pain, improved from prior days  MSK: No muscle/joint pain  GU: No dysuria; no changes in frequency  Neuro: No dizziness, lightheadedness, LOC  Derm: + severe itching        Physical Exam     Temp:  [97.8 ??F (36.6 ??C)-98.3 ??F (36.8 ??C)] 98.3 ??F (36.8 ??C)  Heart Rate:  [89-107] 103  Resp:  [16-18] 18  BP: (109-132)/(57-74) 132/71    Physical Exam  Vitals reviewed.   HENT:      Head: Normocephalic.      Mouth/Throat:      Mouth: Mucous membranes are dry.      Pharynx: Oropharynx is clear.      Comments: Jaundice of mucous membranes, but improved from prior days  Eyes:      Extraocular Movements: Extraocular movements intact.      Conjunctiva/sclera: Conjunctivae normal.      Comments: Scleral icterus, improved from initial presentation   Cardiovascular:      Rate and Rhythm: Normal rate and regular rhythm.      Pulses: Normal pulses.      Heart sounds: Normal heart sounds.   Pulmonary:      Effort: Pulmonary effort is normal.   Abdominal:      General: Bowel sounds are normal. There is distension.      Palpations: Abdomen is soft.      Tenderness: There is abdominal tenderness. There is no guarding.   Musculoskeletal:         General: Normal range of motion.      Cervical back: Normal range of motion.   Skin:     General: Skin is warm and dry.      Coloration: Skin is jaundiced.       Findings: Bruising present.   Neurological:      General: No focal deficit present.      Mental Status: She is alert and oriented to person, place, and time.   Psychiatric:         Mood and Affect: Mood normal.         Behavior: Behavior normal.         No intake or output data in the 24 hours ending 04/08/21 2050      Diagnostic Data     All laboratory data was reviewed and relevant values are noted as below.       Latest Reference Range & Units 04/08/21 15:12 04/09/21 05:53   Alkaline Phosphatase 36 - 125 U/L 99 91   AST (SGOT) 13 - 39 U/L 322 (H) 283 (H)   ALT (SGPT) 7 - 52 U/L 359 (H) 325 (H)   Albumin 3.5 - 5.7 g/dL 2.8 (L) 2.6 (L)   Protein, Total 6.4 - 8.9 g/dL 5.9 (L) 5.5 (L)   Bilirubin, Indirect  0.00 - 1.10 mg/dL 4.54 (H) 0.98 (H)   Bili, Total 0.0 - 1.5 mg/dL 11.9 (H) 14.7 (H)   Bilirubin, Direct 0.00 - 0.40 mg/dL 8.29 (H) 5.62 (H)   (H): Data is abnormally high  (L): Data is abnormally low     Latest Reference Range & Units 04/08/21 11:31 04/08/21 15:12 04/09/21 07:21   Protime 12.1 - 15.1 seconds 22.3 (H) 21.4 (H) 20.4 (H)   INR 0.9 - 1.1  1.9 (H) 1.8 (H) 1.7 (H)   (H): Data is abnormally high     Latest Reference Range & Units 04/09/21 05:53   T3, Total 60.0 - 220.0 ng/dL 130.8   Free T4 6.57 - 1.76 ng/dL 8.46   TSH 9.62 - 9.52 uIU/mL <0.02 (L)   (L): Data is abnormally low    Imaging:   None.    Assessment & Plan     Principal Problem:    Elevated liver enzymes        Tracy Watkins is a 25 y.o. female on HD# 1 with Elevated liver enzymes.  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 Predominantly hepatocellular injury with Tbili peak for 14 and INR 1.9, in the setting of Autoimmune Hepatitis Cirrhosis. Concern for flare of autoimmune hepatitis, likely triggered by ovarian stimulation in the setting of recent IVF. Viral hepatic panels were all negative. No  hemoconcentration or evidence of perihepatic vascular thrombosis. Will continue to treat as AIH flare for now.  - INR remains stable and Hepatic panel slowly downtrending. Continuing to trend INR, hepatic labs, and doing q2h mental status exams.   - Liver and GYN on board; appreciate recs     - Per recs, no changes to Imuran at this point. Biopsy not necessary for now.       - Continue Prednisone 40 daily     - Per liver recs, pt will continue on Prednisone 40 daily at discharge until follow-up as OP  - Continue Imuran 100 daily   - Continue Hydrochlorothiazide 25 daily  - Continue PRN Oxycodone 5-10 for Abdominal Pain  - Continue PRN Phenergan, Zofran for Nausea       - Due to significant history of cirrhosis, autoimmune hepatitis and acute elevation of INR, liver enzymes, will closely monitor patient at this time.  - q2h Mental Status Checks  - q6h  INR  - If any abnormalities or acutely worsening clinical picture, have low threshold to contact team/GI attending    #Pruritis, 2/2 AIH  - Continue Benadryl, Hydroxyzine PRN for itching  - Increased Cholestyramine 4g from daily to BID. Encouraged pt to mix with juice to make more palatable.    #Blood Cultures, positive for Micrococcus  - Initially started on Vanc and Cefepime d/t positive BCx 07/27 for G+ cocci in clusters. Found to be Micrococcus. Very likely contamination vs true bacteremia. Pt has remained afebrile, with stable vitals and no signs of acute infection.   - Discontinued Vanc and Cefepime  - Repeat BCx, pending      #PCOS  #Infertility, on IVF  - Continue home Prenatal Vitamin daily    #Hypothyroidism  - Continue home Levothyroxine daily     #Shortness of Breath,   - Continue home Albuterol inhaler PRN     Code Status: Full  DVT Prophylaxis: SubQ Hep  Nutrition: Reg  Disposition Plan: GI  floor      Arvid Right, MD  PGY1 Internal Medicine  Pager: [phone]  04/08/2021, 8:50 PM

## 2021-04-09 NOTE — Unmapped (Signed)
Pt called this RN with c/o leg tightness and numbness when attempting to ambulate. Team paged.

## 2021-04-09 NOTE — Unmapped (Signed)
Pt c/o dizziness and blurry vision while laying in bed. Team paged.

## 2021-04-10 LAB — RENAL FUNCTION PANEL W/EGFR
Albumin: 2.9 g/dL (ref 3.5–5.7)
Anion Gap: 7 mmol/L (ref 3–16)
BUN: 11 mg/dL (ref 7–25)
CO2: 28 mmol/L (ref 21–33)
Calcium: 8.4 mg/dL (ref 8.6–10.3)
Chloride: 101 mmol/L (ref 98–110)
Creatinine: 0.46 mg/dL (ref 0.60–1.30)
EGFR: >90
Glucose: 59 mg/dL (ref 70–100)
Osmolality, Calculated: 279 mOsm/kg (ref 278–305)
Phosphorus: 3.1 mg/dL (ref 2.1–4.7)
Potassium: 3.2 mmol/L (ref 3.5–5.3)
Sodium: 136 mmol/L (ref 133–146)

## 2021-04-10 LAB — HEPATIC FUNCTION PANEL
ALT: 333 U/L (ref 7–52)
ALT: 309 U/L (ref 7–52)
AST: 264 U/L (ref 13–39)
AST: 246 U/L (ref 13–39)
Albumin: 2.9 g/dL (ref 3.5–5.7)
Albumin: 2.8 g/dL (ref 3.5–5.7)
Alkaline Phosphatase: 102 U/L (ref 36–125)
Alkaline Phosphatase: 93 U/L (ref 36–125)
Bilirubin, Direct: 6.44 mg/dL (ref 0.00–0.40)
Bilirubin, Direct: 6.66 mg/dL (ref 0.00–0.40)
Bilirubin, Indirect: 6.96 mg/dL (ref 0.00–1.10)
Bilirubin, Indirect: 6.64 mg/dL (ref 0.00–1.10)
Total Bilirubin: 13.4 mg/dL (ref 0.0–1.5)
Total Bilirubin: 13.3 mg/dL (ref 0.0–1.5)
Total Protein: 5.9 g/dL (ref 6.4–8.9)
Total Protein: 5.8 g/dL (ref 6.4–8.9)

## 2021-04-10 LAB — CBC
Hematocrit: 28.1 % (ref 35.0–45.0)
Hemoglobin: 10.1 g/dL (ref 11.7–15.5)
MCH: 39.9 pg (ref 27.0–33.0)
MCHC: 36 g/dL (ref 32.0–36.0)
MCV: 110.8 fL (ref 80.0–100.0)
MPV: 7.3 fL (ref 7.5–11.5)
Platelets: 110 10*3/uL (ref 140–400)
RBC: 2.54 10*6/uL (ref 3.80–5.10)
RDW: 23.9 % (ref 11.0–15.0)
WBC: 2.8 10*3/uL (ref 3.8–10.8)

## 2021-04-10 LAB — PROTIME-INR
INR: 1.7 (ref 0.9–1.1)
INR: 1.6 (ref 0.9–1.1)
INR: 1.8 (ref 0.9–1.1)
Protime: 20.6 seconds (ref 12.1–15.1)
Protime: 19.8 seconds (ref 12.1–15.1)
Protime: 21.1 seconds (ref 12.1–15.1)

## 2021-04-10 LAB — MAGNESIUM: Magnesium: 1.8 mg/dL (ref 1.5–2.5)

## 2021-04-10 LAB — POC GLU MONITORING DEVICE: POC Glucose Monitoring Device: 84 mg/dL (ref 70–100)

## 2021-04-10 MED ORDER — oxyCODONE (ROXICODONE) immediate release tablet 5 mg
5 | ORAL | Status: AC | PRN
Start: 2021-04-10 — End: 2021-04-15
  Administered 2021-04-10 – 2021-04-15 (×15): 5 mg via ORAL

## 2021-04-10 MED ORDER — potassium chloride (KLOR-CON M20) CR tablet 40 mEq
20 | Freq: Once | ORAL | Status: AC
Start: 2021-04-10 — End: 2021-04-10
  Administered 2021-04-10: 16:00:00 40 meq via ORAL

## 2021-04-10 MED ORDER — dextrose 10%-water (D10W) IV soln
INTRAVENOUS | Status: AC | PRN
Start: 2021-04-10 — End: 2021-04-15

## 2021-04-10 MED ORDER — predniSONE (DELTASONE) tablet 60 mg
20 | Freq: Every day | ORAL | Status: AC
Start: 2021-04-10 — End: 2021-04-15
  Administered 2021-04-11 – 2021-04-15 (×5): 60 mg via ORAL

## 2021-04-10 MED ORDER — diphenhydrAMINE (BENADRYL) capsule 25 mg
25 | Freq: Four times a day (QID) | ORAL | Status: AC | PRN
Start: 2021-04-10 — End: 2021-04-11
  Administered 2021-04-10 (×2): 25 mg via ORAL

## 2021-04-10 MED ORDER — glucose chewable tablet 12 g
4 | ORAL | Status: AC | PRN
Start: 2021-04-10 — End: 2021-04-15

## 2021-04-10 MED ORDER — predniSONE (DELTASONE) tablet 20 mg
20 | Freq: Once | ORAL | Status: AC
Start: 2021-04-10 — End: 2021-04-10
  Administered 2021-04-10: 19:00:00 20 mg via ORAL

## 2021-04-10 MED ORDER — ursodioL (ACTIGALL) capsule 300 mg
300 | Freq: Two times a day (BID) | ORAL | Status: AC
Start: 2021-04-10 — End: 2021-04-14
  Administered 2021-04-10 – 2021-04-13 (×8): 300 mg via ORAL

## 2021-04-10 MED ORDER — potassium chloride (KLOR-CON M20) CR tablet 40 mEq
20 | Freq: Once | ORAL | Status: AC
Start: 2021-04-10 — End: 2021-04-10
  Administered 2021-04-10: 12:00:00 40 meq via ORAL

## 2021-04-10 MED ORDER — ondansetron (ZOFRAN-ODT) disintegrating tablet 4 mg
4 | Freq: Once | ORAL | Status: AC
Start: 2021-04-10 — End: 2021-04-11
  Administered 2021-04-11: 04:00:00 4 mg via ORAL

## 2021-04-10 MED ORDER — furosemide (LASIX) injection 20 mg
10 | Freq: Once | INTRAMUSCULAR | Status: AC
Start: 2021-04-10 — End: 2021-04-10
  Administered 2021-04-10: 19:00:00 20 mg via INTRAVENOUS

## 2021-04-10 MED FILL — ONDANSETRON HCL (PF) 4 MG/2 ML INJECTION SOLUTION: 4 4 mg/2 mL | INTRAMUSCULAR | Qty: 2

## 2021-04-10 MED FILL — URSODIOL 300 MG CAPSULE: 300 300 mg | ORAL | Qty: 1

## 2021-04-10 MED FILL — DIPHENHYDRAMINE 25 MG CAPSULE: 25 25 mg | ORAL | Qty: 1

## 2021-04-10 MED FILL — PREDNISONE 20 MG TABLET: 20 20 MG | ORAL | Qty: 1

## 2021-04-10 MED FILL — PREVALITE 4 GRAM POWDER FOR SUSP IN A PACKET: 4 4 gram | ORAL | Qty: 1

## 2021-04-10 MED FILL — OXYCODONE 5 MG TABLET: 5 5 MG | ORAL | Qty: 1

## 2021-04-10 MED FILL — PROMETHAZINE 25 MG/ML INJECTION SOLUTION: 25 25 mg/mL | INTRAMUSCULAR | Qty: 1

## 2021-04-10 MED FILL — HEPARIN (PORCINE) 5,000 UNIT/ML INJECTION SOLUTION: 5000 5,000 unit/mL | INTRAMUSCULAR | Qty: 1

## 2021-04-10 MED FILL — PRENATAL PLUS (CALCIUM CARBONATE) 27 MG IRON-1 MG TABLET: 27 27 mg iron- 1 mg | ORAL | Qty: 1

## 2021-04-10 MED FILL — HYDROCHLOROTHIAZIDE 25 MG TABLET: 25 25 MG | ORAL | Qty: 1

## 2021-04-10 MED FILL — POTASSIUM CHLORIDE ER 20 MEQ TABLET,EXTENDED RELEASE(PART/CRYST): 20 20 MEQ | ORAL | Qty: 2

## 2021-04-10 MED FILL — LEVOTHYROXINE 112 MCG TABLET: 112 112 MCG | ORAL | Qty: 1

## 2021-04-10 MED FILL — FUROSEMIDE 10 MG/ML INJECTION SOLUTION: 10 10 mg/mL | INTRAMUSCULAR | Qty: 4

## 2021-04-10 MED FILL — PREDNISONE 20 MG TABLET: 20 20 MG | ORAL | Qty: 2

## 2021-04-10 MED FILL — AZATHIOPRINE 50 MG TABLET: 50 50 mg | ORAL | Qty: 2

## 2021-04-10 MED FILL — OXYCODONE 5 MG TABLET: 5 5 MG | ORAL | Qty: 2

## 2021-04-10 NOTE — Unmapped (Signed)
Team at bedside.

## 2021-04-10 NOTE — Unmapped (Signed)
This RN notified of blood glucose level of 59 by provider and instructed to obtain repeat POC. Upon reassessment, pt FSBS is 84. Pt states feeling  clammy. Pt requested orange juice. Orange juice given. Will continue to monitor.

## 2021-04-10 NOTE — Unmapped (Signed)
HEPATOLOGY PROGRESS NOTE    ID: Tracy Watkins is a 25 y.o. 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 States that symptoms of dizziness, jaundice started about 1.5 - 2 weeks ago, shortly after the start of starting injection as part of an In vitro fertilization protocol. On arrival liver enzymes were notable for AlkP of 106, AST/ALT of 623/463 and tbili 13.8, having all been WNL on 12/16/20. Started on Prednisone 40mg  on 7/26.    Interim:  -Not much improvement in liver enzymes  -Still having abdominal pain  -Legs swelling and bruising.      ROS:  10 pt ROS negative unless otherwise noted above    Vitals:  Temp:  [97.9 ??F (36.6 ??C)-98.7 ??F (37.1 ??C)] 98.7 ??F (37.1 ??C)  Heart Rate:  [90-102] 100  Resp:  [16-20] 18  BP: (102-121)/(53-88) 121/65    Intake/Output Summary (Last 24 hours) at 04/10/2021 1620  Last data filed at 04/10/2021 0511  Gross per 24 hour   Intake 20 ml   Output --   Net 20 ml       Physical Exam   Gen: Well-developed, well nourished. No acute distress.   HEENT: NC/AT, EOMI, moist mucus membranes  CV: RRR, no m/r/g, no LE edema  Lungs: Clear to auscultation bilaterally.   Abdomen: +BS, soft, mild tenderness worse in RUQ  Extremities: warm, well perfused. Bilateral pitting edema of legs  Neuro: A&Ox3, moving all extremities spontaneous, no asterixis   Skin: no bruising/rashes/jaundice  Psych: normal affect    MELD-Na score: 23 at 04/10/2021  6:03 AM  Calculated from:  Serum Creatinine: 0.46 mg/dL (Using min of 1 mg/dL) at 1/61/0960  4:54 AM  Serum Sodium: 136 mmol/L at 04/10/2021  6:03 AM  Total Bilirubin: 13.4 mg/dL at 0/98/1191  4:78 AM  INR(ratio): 1.6 at 04/10/2021  6:03 AM  Age: 25 years        Assessment/Plan:   Tracy Watkins is a 25 y.o. 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  ??  ??  #Elevated liver enzymes,  history of AIH: significant, acute elevation in liver enzymes coincides with recent IVF treatment about 2 weeks ago. While flare of autoimmune hepatitis is high on the differential, it is also possible that elevations are related to Ovarian hyperstimulation syndrome with a more severe presentation given her underlying cirrhosis. Gyn has less concern for this. Infectious etiology also need to be ruled out however are lower on the differential  ??  Recommendations:  -Increase Prednisone to 60mg  daily  -Add ursodiol to cholestyramine for itching.  -Continue Imuran 100mg   -Trend LFTs  -If liver enzymes not improved over the weekend, will plan for liver biopsy on Monday. Patient is currently on the schedule for Monday though will cancel if LFT improved. If remaining elevated, make NPO Sunday night.      Tracy Frederic, MD  Gastroenterology/Hepatology Fellow- PGY 6  Pager [phone]

## 2021-04-10 NOTE — Unmapped (Signed)
Department of Internal Medicine  Daily Progress Note      Chief Complaint / Reason for Follow-up     Tracy Watkins is a 25 y.o. female on hospital day 3. The principal reason for today's follow up visit is Elevated liver enzymes.    Interval History / Subjective     Yesterday, patient felt lightheaded, and sleepy with difficulty staying awake. She also reported worsening LE swelling with associated numbness over her right anterolateral thigh. Orthostatic vital signs performed and were normal. Noted to have taken benadryl and oxycodone simultaneously prior to sleepiness. Benadryl was discontinued to limit oversedation, with improvement in symptoms.    She is anxious about the diminished improvement in her labwork and is concerned this may lead to being re-listed for liver transplant.    +RUQ abdominal pain, bruising on posterior legs  - Nausea, vomiting, chest pain, shortness of breath, dysuria, constipation    Physical Exam     Temp:  [97.7 ??F (36.5 ??C)-98.5 ??F (36.9 ??C)] 98.5 ??F (36.9 ??C)  Heart Rate:  [85-117] 96  Resp:  [16-20] 16  BP: (109-120)/(53-88) 119/57    Physical Exam  Vitals reviewed.   Constitutional:       General: She is not in acute distress.     Comments: Sitting on couch and able to stand unassisted for portions of exam   HENT:      Head: Normocephalic.      Nose: No congestion or rhinorrhea.      Mouth/Throat:      Mouth: Mucous membranes are moist.      Pharynx: Oropharynx is clear.      Comments: Jaundice of mucous membranes, but improved from prior days  Eyes:      General: Scleral icterus present.      Extraocular Movements: Extraocular movements intact.      Conjunctiva/sclera: Conjunctivae normal.      Comments: Scleral icterus, improved from initial presentation   Cardiovascular:      Rate and Rhythm: Normal rate and regular rhythm.      Pulses: Normal pulses.      Heart sounds: Normal heart sounds.   Pulmonary:      Effort: Pulmonary effort is normal.   Abdominal:      General: Bowel  sounds are normal. There is distension.      Palpations: Abdomen is soft.      Tenderness: There is abdominal tenderness (RUQ). There is no guarding.   Musculoskeletal:         General: Normal range of motion.      Cervical back: Normal range of motion and neck supple.      Right lower leg: Edema (to hips) present.      Left lower leg: Edema (to hips) present.   Skin:     General: Skin is warm and dry.      Coloration: Skin is jaundiced.      Findings: Bruising present.   Neurological:      General: No focal deficit present.      Mental Status: She is alert and oriented to person, place, and time.   Psychiatric:         Mood and Affect: Mood is anxious.         Behavior: Behavior normal.           Intake/Output Summary (Last 24 hours) at 04/10/2021 0610  Last data filed at 04/10/2021 0511  Gross per 24 hour   Intake 20 ml  Output --   Net 20 ml         Diagnostic Data     All laboratory data was reviewed and relevant values are noted as below.    Lab Results   Component Value Date    WBC 2.8 (L) 04/10/2021    RBC 2.54 (L) 04/10/2021    HGB 10.1 (L) 04/10/2021    HCT 28.1 (L) 04/10/2021    MCV 110.8 (H) 04/10/2021    MCH 39.9 (H) 04/10/2021    MCHC 36.0 04/10/2021    RDW 23.9 (H) 04/10/2021    PLT 110 (L) 04/10/2021     Lab Results   Component Value Date    CREATININE 0.46 (L) 04/10/2021    BUN 11 04/10/2021    NA 136 04/10/2021    K 3.2 (L) 04/10/2021    CL 101 04/10/2021    CO2 28 04/10/2021     Lab Results   Component Value Date    ALKPHOS 93 04/10/2021    ALT 309 (H) 04/10/2021    AST 246 (H) 04/10/2021    BILITOT 13.3 (H) 04/10/2021    ALBUMIN 2.8 (L) 04/10/2021    BILIDIRECT 6.66 (H) 04/10/2021    PROT 5.8 (L) 04/10/2021    LABGLOB 3.4 04/16/2020     Lab Results   Component Value Date    INR 1.8 (H) 04/10/2021    INR 1.6 (H) 04/10/2021    INR 1.7 (H) 04/09/2021    PROTIME 21.1 (H) 04/10/2021    PROTIME 19.8 (H) 04/10/2021    PROTIME 20.6 (H) 04/09/2021     Imaging:   None.    Assessment & Plan     Principal  Problem:    Elevated liver enzymes        Tracy Watkins is a 25 y.o. female on HD# 3 with Elevated liver enzymes.  The medical issues being addressed in today's encounter are as follows:    #Acute liver injury  #Cirrhosis d/b Ascites, NBEV, 2/2 AIH  C/f autoimmune hepatitis flare vs ovarian hyperstimulation syndrome in the setting of recent in vitro fertilization. Liver enzymes, bilirubin, and INR with initial improvement that has now plateaued.   - Liver consulted  - Gynecology consulted  - Monitoring labs q12h  - Neuro checks q4h  - Plan for liver biopsy 8/1  - Increase prednisone 40mg  -> 60mg  daily - continue on discharge until Liver clinic f/u  - Azathioprine 100 daily   - Decrease PRN Oxycodone 5-10mg  to 5mg  only  - PRN Phenergan, Zofran for Nausea    #Hypoglycemia  Seen on AM labs 7/29. Concerning for decreased hepatic glycogen reserve/ gluconeogenesis function. Repeat POC glucose improved.  - POC glucose qAC/HS  - Hypoglycemia protocol    #Pruritis  Secondary to hyperbilirubinemia. Refractory to benadryl and hydroxyzine  - Cholestyramine 4g from BID  - Start ursodiol    #Lower extremity edema  Likely secondary to decreased mobility with venous stasis, vasodilation related to recent IVF hormones. Abdominal US on admission negative for thrombosis.  - Hold Hydrochlorothiazide 25 daily  - Trial lasix 20mg  IV today  - Thigh high compression stockings    #Blood Cultures, positive for Micrococcus  Likely contaminant with repeat cultures negative.   - S/p vancomycin 7/27     #PCOS  #Infertility, on IVF  - Prenatal Vitamin daily    #Hypothyroidism  - Levothyroxine daily     #Asthma  - Albuterol inhaler PRN     Code Status: Full  DVT Prophylaxis: SubQ Hep  Nutrition: Reg  Disposition Plan: GI floor      Rosezella Florida. Adolm Joseph, M.D.  Internal Medicine & Pediatrics, PGY-3

## 2021-04-10 NOTE — Unmapped (Signed)
[place]  Case Management/Social Work Department  Progress Note    Patient Information     Hospital day: 3  Inpatient/Observation:  Inpatient   Level of Care:  Floor status  Admit date:  04/07/2021  Admission diagnosis: HEPATITIS    PMH:  has a past medical history of Acne, Amenorrhea, Autoimmune hepatitis (CMS Dx) (03/2016), Bilateral leg edema, Cirrhosis (CMS Dx), Dysmenorrhea, Esophageal varices (CMS Dx) (05/2016), H/O infertility, and PCOS (polycystic ovarian syndrome).    PCP:  Faylene Million, DO    Home Pharmacy:    [facility] - [place] [employer], [place] - [address]  [address]  [address]  [place] [address]  Phone: [phone]     [facility] [address] [place], [place] - [address] [place] [address]  Phone: [phone]         Medical Insurance Coverage:  Payor: [phone] TRICARE / Plan: [facility] MILITARY TRICARE / Product Type: *No Product type* /     Other Pertinent Information     CM has completed a chart review and completed rounds with the team. Per team, the pt is not medically ready for D/C today. Plan for liver biopsy on Monday.     Discharge Plan     Anticipated discharge plan:  Home with no needs     Anticipated discharge date:  08/02     CM/SW will continue to follow and remain available for discharge planning needs.      Sol Passer, RN     Cell [phone]

## 2021-04-11 ENCOUNTER — Inpatient Hospital Stay: Admit: 2021-04-11 | Payer: TRICARE (CHAMPUS)

## 2021-04-11 ENCOUNTER — Inpatient Hospital Stay: Payer: TRICARE (CHAMPUS)

## 2021-04-11 LAB — RENAL FUNCTION PANEL W/EGFR
Albumin: 2.5 g/dL (ref 3.5–5.7)
Anion Gap: 4 mmol/L (ref 3–16)
BUN: 11 mg/dL (ref 7–25)
CO2: 30 mmol/L (ref 21–33)
Calcium: 8.1 mg/dL (ref 8.6–10.3)
Chloride: 104 mmol/L (ref 98–110)
Creatinine: 0.53 mg/dL (ref 0.60–1.30)
EGFR: >90
Glucose: 86 mg/dL (ref 70–100)
Osmolality, Calculated: 285 mOsm/kg (ref 278–305)
Phosphorus: 2.9 mg/dL (ref 2.1–4.7)
Potassium: 3.7 mmol/L (ref 3.5–5.3)
Sodium: 138 mmol/L (ref 133–146)

## 2021-04-11 LAB — CBC
Hematocrit: 26 % (ref 35.0–45.0)
Hemoglobin: 9.4 g/dL (ref 11.7–15.5)
MCH: 40.6 pg (ref 27.0–33.0)
MCHC: 36.3 g/dL (ref 32.0–36.0)
MCV: 111.8 fL (ref 80.0–100.0)
MPV: 7.6 fL (ref 7.5–11.5)
Platelets: 87 10*3/uL (ref 140–400)
RBC: 2.32 10*6/uL (ref 3.80–5.10)
RDW: 23.7 % (ref 11.0–15.0)
WBC: 1.8 10*3/uL (ref 3.8–10.8)

## 2021-04-11 LAB — HEPATIC FUNCTION PANEL
ALT: 264 U/L (ref 7–52)
ALT: 262 U/L (ref 7–52)
AST: 176 U/L (ref 13–39)
AST: 218 U/L (ref 13–39)
Albumin: 2.5 g/dL (ref 3.5–5.7)
Albumin: 2.7 g/dL (ref 3.5–5.7)
Alkaline Phosphatase: 84 U/L (ref 36–125)
Alkaline Phosphatase: 77 U/L (ref 36–125)
Bilirubin, Direct: 5.74 mg/dL (ref 0.00–0.40)
Bilirubin, Direct: 4.05 mg/dL (ref 0.00–0.40)
Bilirubin, Indirect: 5.36 mg/dL (ref 0.00–1.10)
Bilirubin, Indirect: 6.35 mg/dL (ref 0.00–1.10)
Total Bilirubin: 11.1 mg/dL (ref 0.0–1.5)
Total Bilirubin: 10.4 mg/dL (ref 0.0–1.5)
Total Protein: 5.2 g/dL (ref 6.4–8.9)
Total Protein: 5.4 g/dL (ref 6.4–8.9)

## 2021-04-11 LAB — LIPASE: Lipase: 86 U/L (ref 4–82)

## 2021-04-11 LAB — PROTIME-INR
INR: 1.9 (ref 0.9–1.1)
INR: 1.7 (ref 0.9–1.1)
Protime: 22.1 seconds (ref 12.1–15.1)
Protime: 20.8 seconds (ref 12.1–15.1)

## 2021-04-11 LAB — POC GLU MONITORING DEVICE: POC Glucose Monitoring Device: 117 mg/dL — ABNORMAL HIGH (ref 70–100)

## 2021-04-11 LAB — LACTIC ACID: Lactate: 0.9 mmol/L (ref 0.5–2.2)

## 2021-04-11 MED ORDER — ondansetron (ZOFRAN) tablet 4 mg
4 | Freq: Once | ORAL | Status: AC
Start: 2021-04-11 — End: 2021-04-12

## 2021-04-11 MED ORDER — ondansetron (ZOFRAN-ODT) 4 MG disintegrating tablet
4 | ORAL | Status: AC
Start: 2021-04-11 — End: 2021-04-11
  Administered 2021-04-11: 14:00:00 4 via ORAL

## 2021-04-11 MED ORDER — OMNIPAQUE (iohexol) 350 mg iodine/mL 100 mL
350 | Freq: Once | INTRAVENOUS | Status: AC | PRN
Start: 2021-04-11 — End: 2021-04-11
  Administered 2021-04-11: 18:00:00 120 mL via INTRAVENOUS

## 2021-04-11 MED ORDER — HYDROmorphone (DILAUDID) injection Syrg 0.5 mg
0.5 | Freq: Once | INTRAMUSCULAR | Status: AC
Start: 2021-04-11 — End: 2021-04-11
  Administered 2021-04-12: 04:00:00 0.5 mg via INTRAVENOUS

## 2021-04-11 MED ORDER — HYDROmorphone (DILAUDID) injection Syrg 0.5 mg
0.5 | Freq: Once | INTRAMUSCULAR | Status: AC
Start: 2021-04-11 — End: 2021-04-11
  Administered 2021-04-11: 16:00:00 0.5 mg via INTRAVENOUS

## 2021-04-11 MED FILL — OXYCODONE 5 MG TABLET: 5 5 MG | ORAL | Qty: 1

## 2021-04-11 MED FILL — LEVOTHYROXINE 112 MCG TABLET: 112 112 MCG | ORAL | Qty: 1

## 2021-04-11 MED FILL — PRENATAL PLUS (CALCIUM CARBONATE) 27 MG IRON-1 MG TABLET: 27 27 mg iron- 1 mg | ORAL | Qty: 1

## 2021-04-11 MED FILL — ONDANSETRON HCL (PF) 4 MG/2 ML INJECTION SOLUTION: 4 4 mg/2 mL | INTRAMUSCULAR | Qty: 2

## 2021-04-11 MED FILL — ONDANSETRON 4 MG DISINTEGRATING TABLET: 4 4 MG | ORAL | Qty: 1

## 2021-04-11 MED FILL — OMNIPAQUE 350 MG IODINE/ML INTRAVENOUS SOLUTION: 350 350 mg iodine/mL | INTRAVENOUS | Qty: 100

## 2021-04-11 MED FILL — PROMETHAZINE 25 MG/ML INJECTION SOLUTION: 25 25 mg/mL | INTRAMUSCULAR | Qty: 1

## 2021-04-11 MED FILL — PREVALITE 4 GRAM POWDER FOR SUSP IN A PACKET: 4 4 gram | ORAL | Qty: 1

## 2021-04-11 MED FILL — PREDNISONE 20 MG TABLET: 20 20 MG | ORAL | Qty: 3

## 2021-04-11 MED FILL — HEPARIN (PORCINE) 5,000 UNIT/ML INJECTION SOLUTION: 5000 5,000 unit/mL | INTRAMUSCULAR | Qty: 1

## 2021-04-11 MED FILL — HYDROMORPHONE 0.5 MG/0.5 ML INJECTION SYRINGE: 0.5 0.5 mg/0.5 mL | INTRAMUSCULAR | Qty: 0.5

## 2021-04-11 MED FILL — AZATHIOPRINE 50 MG TABLET: 50 50 mg | ORAL | Qty: 2

## 2021-04-11 MED FILL — URSODIOL 300 MG CAPSULE: 300 300 mg | ORAL | Qty: 1

## 2021-04-11 NOTE — Unmapped (Signed)
Department of Internal Medicine  Daily Progress Note      Chief Complaint / Reason for Follow-up     Tracy Watkins is a 25 y.o. female on hospital day 4. The principal reason for today's follow up visit is Elevated liver enzymes.    Interval History / Subjective     NAEO. Pt endorses pruritis, but somewhat improved from prior days. Abdominal pain improved, with no nausea/vomiting. Has good appetite; states she's craving protein. Denies dizziness, lightheadedness.     Pt feeling anxious about elevated MELD score and likely liver biopsy on Monday.    ROS:   General: No fever, chills; + intermittent lightheadedness, improved  Eyes: No vision changes  Cardiovascular: No chest pain, palpitations  Lungs: No SOB, cough  GI: No n/v, constipation, diarrhea; + mild abdominal pain, significantly   MSK: No muscle/joint pain  GU: No dysuria; no changes in frequency  Neuro: No dizziness, lightheadedness, LOC  Derm: + Itching, improved from prior days        Physical Exam     Physical Exam  Vitals reviewed.   Constitutional:       General: She is not in acute distress.     Appearance: Normal appearance. She is obese. She is not ill-appearing.   HENT:      Head: Normocephalic.      Mouth/Throat:      Mouth: Mucous membranes are dry.      Pharynx: Oropharynx is clear.   Eyes:      Extraocular Movements: Extraocular movements intact.      Conjunctiva/sclera: Conjunctivae normal.      Comments: Improved scleral icterus   Cardiovascular:      Rate and Rhythm: Normal rate and regular rhythm.      Pulses: Normal pulses.      Heart sounds: Normal heart sounds.   Pulmonary:      Effort: Pulmonary effort is normal.      Breath sounds: Normal breath sounds.   Abdominal:      General: Bowel sounds are normal.      Palpations: Abdomen is soft.      Comments: Very mild tenderness to palpation, improved from prior days   Musculoskeletal:         General: Normal range of motion.      Comments: Mild BLE edema, similar to prior days   Skin:      General: Skin is warm and dry.      Coloration: Skin is jaundiced.   Neurological:      General: No focal deficit present.      Mental Status: She is alert and oriented to person, place, and time.   Psychiatric:         Mood and Affect: Mood normal.         Behavior: Behavior normal.      Comments: + mild anxiety          Diagnostic Data     All laboratory data was reviewed and relevant values are noted as below.     Latest Reference Range & Units 04/10/21 06:03 04/10/21 17:30 04/11/21 06:06   Alkaline Phosphatase 36 - 125 U/L 102 93 84   AST (SGOT) 13 - 39 U/L 264 (H) 246 (H) 176 (H)   ALT (SGPT) 7 - 52 U/L 333 (H) 309 (H) 264 (H)   Albumin 3.5 - 5.7 g/dL 2.9 (L) 2.8 (L) 2.5 (L)   Protein, Total 6.4 - 8.9 g/dL 5.9 (L) 5.8 (L)  5.2 (L)   Bilirubin, Indirect 0.00 - 1.10 mg/dL 1.61 (H) 0.96 (H) 0.45 (H)   Bili, Total 0.0 - 1.5 mg/dL 40.9 (H) 81.1 (H) 91.4 (H)   Bilirubin, Direct 0.00 - 0.40 mg/dL 7.82 (H) 9.56 (H) 2.13 (H)   (H): Data is abnormally high  (L): Data is abnormally low      Latest Reference Range & Units 04/10/21 06:03 04/10/21 17:30 04/11/21 06:06   Protime 12.1 - 15.1 seconds 19.8 (H) 21.1 (H) 22.1 (H)   INR 0.9 - 1.1  1.6 (H) 1.8 (H) 1.9 (H)   (H): Data is abnormally high      Imaging:   None.    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     #Acute liver injury  #Cirrhosis d/b Ascites, NBEV, 2/2 AIH  - C/f flare of autoimmune hepatitis flare vs ovarian hyperstimulation syndrome, in the setting of recent IVF. Liver enzymes, bilirubin, INR with initial improvement that has now plateaued.   - Still considerable concern, especially given plateauted liver chemistries and recent episodes of hypoglycemia. Will continue watchful waiting and see she does with increased Prednisone dosage  started yesterday.    - MELD-Na score: 23 at 04/11/2021  6:06 AM     Calculated from:     - Serum Creatinine: 0.53 mg/dL (Using min of 1 mg/dL) at 0/86/5784  6:96 AM     - Serum Sodium: 138 mmol/L (Using max of 137 mmol/L) at 04/11/2021  6:06 AM     - Total Bilirubin: 11.1 mg/dL at 2/95/2841  3:24 AM     - INR(ratio): 1.9 at 04/11/2021  6:06 AM      Age: 55 years    - Child Pugh Score: 10, Class C      - Liver and GYN consulted; appreciate recs  - Monitoring labs q12h  - Neuro check q4h    - Continue Azathioprine 100 daily   - Continue Prednisone 60 daily, with plans to continue on discharge until Liver follow-up  - Continue PRN Oxycodone 5 q4h, for abdominal pain  - Continue PRN Phenergan, Zofran, for nausea    #Hypoglycemia   - Seen on AM labs 07/29. Concerning for decreased hepatic glycogen reserve/ gluconeogenesis function. Repeat POC glucose improved.  - POC glucose qAC/HS  - Hypoglycemia protocol  ??  #Pruritis  Secondary to hyperbilirubinemia. Refractory to benadryl and hydroxyzine  - Continue Cholestyramine 4g BID  - Continue ursodiol  ??  #Lower extremity edema  Likely secondary to decreased mobility with venous stasis, vasodilation related to recent IVF hormones. Abdominal US on admission negative for thrombosis.  - Hold Hydrochlorothiazide 25 daily  - Continue lasix 20mg  IV  - Thigh high compression stockings  ??  #Blood Cultures, positive for Micrococcus  Likely contaminant with repeat cultures negative.   - S/p vancomycin 7/27     #PCOS  #Infertility, on IVF  - Prenatal Vitamin daily  ??  #Hypothyroidism  - Levothyroxine daily   ??  #Asthma  - Albuterol inhaler PRN   ??  Code Status: Full  DVT Prophylaxis: SubQ Hep  Nutrition: Reg  Disposition Plan: GI floor  ??      Arvid Right, MD  PGY1 Internal Medicine  Pager: [phone]  04/11/2021, 4:53 AM

## 2021-04-11 NOTE — Unmapped (Signed)
Upon entering pt room to give morning medications pt found sitting in bed tearful. Pt states  I am nauseous and I'm in so much pain . One time dose of ODT Zofran given along with PRN oxycodone. Pt currently has zero IV access. Order x2 for UGPIV. Team at bedside. Order for stat CT placed and a call to PICC line/ultrasound line is out.

## 2021-04-11 NOTE — Unmapped (Signed)
Patient with limited veins, if this access is lost would suggest central access, no vessels left for canulation

## 2021-04-11 NOTE — Unmapped (Signed)
Pt transported by w/c to CT.

## 2021-04-12 LAB — HEPATIC FUNCTION PANEL
ALT: 233 U/L (ref 7–52)
AST: 200 U/L (ref 13–39)
Albumin: 2.6 g/dL (ref 3.5–5.7)
Alkaline Phosphatase: 67 U/L (ref 36–125)
Bilirubin, Direct: 3.22 mg/dL (ref 0.00–0.40)
Bilirubin, Indirect: 6.68 mg/dL (ref 0.00–1.10)
Total Bilirubin: 9.9 mg/dL (ref 0.0–1.5)
Total Protein: 5.2 g/dL (ref 6.4–8.9)

## 2021-04-12 LAB — DIFFERENTIAL
Basophils Absolute: 5 /uL (ref 0–200)
Basophils Relative: 0.3 % (ref 0.0–1.0)
Eosinophils Absolute: 83 /uL (ref 15–500)
Eosinophils Relative: 4.6 % (ref 0.0–8.0)
Lymphocytes Absolute: 628 /uL (ref 850–3900)
Lymphocytes Relative: 34.9 % (ref 15.0–45.0)
Monocytes Absolute: 160 /uL (ref 200–950)
Monocytes Relative: 8.9 % (ref 0.0–12.0)
Neutrophils Absolute: 923 /uL (ref 1500–7800)
Neutrophils Relative: 51.3 % (ref 40.0–80.0)
nRBC: 1 /100 WBC (ref 0–0)

## 2021-04-12 LAB — RENAL FUNCTION PANEL W/EGFR
Albumin: 2.6 g/dL (ref 3.5–5.7)
Anion Gap: 1 mmol/L (ref 3–16)
BUN: 11 mg/dL (ref 7–25)
CO2: 28 mmol/L (ref 21–33)
Calcium: 7.7 mg/dL (ref 8.6–10.3)
Chloride: 105 mmol/L (ref 98–110)
Creatinine: 0.5 mg/dL (ref 0.60–1.30)
EGFR: >90
Glucose: 73 mg/dL (ref 70–100)
Osmolality, Calculated: 276 mOsm/kg (ref 278–305)
Phosphorus: 3.7 mg/dL (ref 2.1–4.7)
Potassium: 6.2 mmol/L (ref 3.5–5.3)
Sodium: 134 mmol/L (ref 133–146)

## 2021-04-12 LAB — CBC
Hematocrit: 29.7 % (ref 35.0–45.0)
Hemoglobin: 10.5 g/dL (ref 11.7–15.5)
MCH: 40.1 pg (ref 27.0–33.0)
MCHC: 35.2 g/dL (ref 32.0–36.0)
MCV: 113.9 fL (ref 80.0–100.0)
MPV: 7.9 fL (ref 7.5–11.5)
Platelets: 95 10*3/uL (ref 140–400)
RBC: 2.61 10*6/uL (ref 3.80–5.10)
RDW: 24.6 % (ref 11.0–15.0)
WBC: 1.8 10*3/uL (ref 3.8–10.8)

## 2021-04-12 LAB — PROTIME-INR
INR: 1.7 (ref 0.9–1.1)
Protime: 20.5 seconds (ref 12.1–15.1)

## 2021-04-12 LAB — MAGNESIUM: Magnesium: 2.1 mg/dL (ref 1.5–2.5)

## 2021-04-12 MED ORDER — hydrOXYzine HCL (ATARAX) tablet 25 mg
25 | Freq: Once | ORAL | Status: AC
Start: 2021-04-12 — End: 2021-04-12
  Administered 2021-04-12: 06:00:00 25 mg via ORAL

## 2021-04-12 MED ORDER — acetaminophen (TYLENOL) tablet 325 mg
325 | ORAL | Status: AC | PRN
Start: 2021-04-12 — End: 2021-04-15
  Administered 2021-04-13 – 2021-04-15 (×2): 325 mg via ORAL

## 2021-04-12 MED FILL — ONDANSETRON HCL (PF) 4 MG/2 ML INJECTION SOLUTION: 4 4 mg/2 mL | INTRAMUSCULAR | Qty: 2

## 2021-04-12 MED FILL — PREVALITE 4 GRAM POWDER FOR SUSP IN A PACKET: 4 4 gram | ORAL | Qty: 1

## 2021-04-12 MED FILL — OXYCODONE 5 MG TABLET: 5 5 MG | ORAL | Qty: 1

## 2021-04-12 MED FILL — PROMETHAZINE 25 MG/ML INJECTION SOLUTION: 25 25 mg/mL | INTRAMUSCULAR | Qty: 1

## 2021-04-12 MED FILL — PRENATAL PLUS (CALCIUM CARBONATE) 27 MG IRON-1 MG TABLET: 27 27 mg iron- 1 mg | ORAL | Qty: 1

## 2021-04-12 MED FILL — URSODIOL 300 MG CAPSULE: 300 300 mg | ORAL | Qty: 1

## 2021-04-12 MED FILL — HYDROXYZINE HCL 25 MG TABLET: 25 25 MG | ORAL | Qty: 1

## 2021-04-12 MED FILL — ONDANSETRON HCL 4 MG TABLET: 4 4 MG | ORAL | Qty: 1

## 2021-04-12 MED FILL — PREDNISONE 20 MG TABLET: 20 20 MG | ORAL | Qty: 3

## 2021-04-12 MED FILL — AZATHIOPRINE 50 MG TABLET: 50 50 mg | ORAL | Qty: 2

## 2021-04-12 MED FILL — HYDROMORPHONE 0.5 MG/0.5 ML INJECTION SYRINGE: 0.5 0.5 mg/0.5 mL | INTRAMUSCULAR | Qty: 0.5

## 2021-04-12 MED FILL — HEPARIN (PORCINE) 5,000 UNIT/ML INJECTION SOLUTION: 5000 5,000 unit/mL | INTRAMUSCULAR | Qty: 1

## 2021-04-12 MED FILL — LEVOTHYROXINE 112 MCG TABLET: 112 112 MCG | ORAL | Qty: 1

## 2021-04-12 NOTE — Unmapped (Signed)
Department of Internal Medicine  Daily Progress Note      Chief Complaint / Reason for Follow-up     Tracy Watkins is a 25 y.o. female on hospital day 5. The principal reason for today's follow up visit is Elevated liver enzymes.    Interval History / Subjective     Overnight, had one episode of severe abdominal pain, improved with IV Dilaudid 0.5 x1. No change in mental status.    This am, resting comfortably in bed, without pain, n/v, dizziness/lightheadedness. Reports feeling tired today, but attributes that to being woken for vitals/labs/mental status checks.     ROS:   General: No fever, chills  Eyes: No vision changes  Cardiovascular: No chest pain, palpitations  Lungs: No SOB, cough  GI: No abdominal pain this am  MSK: No muscle/joint pain  GU: No dysuria; no changes in frequency  Neuro: No dizziness, lightheadedness, LOC      Physical Exam     Physical Exam  Vitals reviewed.   Constitutional:       General: She is not in acute distress.     Appearance: Normal appearance. She is obese. She is not ill-appearing.   HENT:      Head: Normocephalic.      Mouth/Throat:      Pharynx: Oropharynx is clear.      Comments: + mild jaundice noted under tongue, greatly improved from prior days  Eyes:      Extraocular Movements: Extraocular movements intact.      Conjunctiva/sclera: Conjunctivae normal.      Comments: + mild scleral icterus, greatly improved from prior days   Cardiovascular:      Rate and Rhythm: Normal rate and regular rhythm.      Pulses: Normal pulses.      Heart sounds: Normal heart sounds.   Pulmonary:      Effort: Pulmonary effort is normal.      Breath sounds: Normal breath sounds.   Abdominal:      General: Bowel sounds are normal.      Palpations: Abdomen is soft.      Tenderness: There is no abdominal tenderness. There is no guarding.   Musculoskeletal:         General: Normal range of motion.      Cervical back: Normal range of motion.   Skin:     General: Skin is warm and dry.   Neurological:       General: No focal deficit present.      Mental Status: She is alert and oriented to person, place, and time.   Psychiatric:         Mood and Affect: Mood normal.         Behavior: Behavior normal.          Diagnostic Data     All laboratory data was reviewed and relevant values are noted as below.     Latest Reference Range & Units 04/11/21 06:06 04/11/21 11:29 04/11/21 17:55 04/12/21 05:32   Alkaline Phosphatase 36 - 125 U/L 84  77 67   AST (SGOT) 13 - 39 U/L 176 (H)  218 (H) 200 (H)   ALT (SGPT) 7 - 52 U/L 264 (H)  262 (H) 233 (H)   Albumin 3.5 - 5.7 g/dL 2.5 (L)  2.7 (L) 2.6 (L)   Protein, Total 6.4 - 8.9 g/dL 5.2 (L)  5.4 (L) 5.2 (L)   Bilirubin, Indirect 0.00 - 1.10 mg/dL 4.69 (H)  6.29 (H) 5.28 (  H)   Bili, Total 0.0 - 1.5 mg/dL 16.1 (H)  09.6 (H) 9.9 (H)   Bilirubin, Direct 0.00 - 0.40 mg/dL 0.45 (H)  4.09 (H) 8.11 (H)   Lipase 4 - 82 U/L  86 (H)     (H): Data is abnormally high  (L): Data is abnormally low        Imaging:   None.    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   ??  #Acute liver injury  #Cirrhosis d/b Ascites, NBEV, 2/2 AIH  - C/f flare of autoimmune hepatitis flare vs ovarian hyperstimulation syndrome, in the setting of recent IVF. Liver enzymes, bilirubin, INR with initial improvement that has now plateaued.   - Still considerable concern, especially given plateauted liver chemistries and recent episodes of hypoglycemia. Will continue watchful waiting and see she does with increased Prednisone dosage started yesterday.  ??  MELD-Na score: 21 at 04/11/2021  5:55 PM  Calculated from:  Serum Creatinine: 0.53 mg/dL (Using min of 1 mg/dL) at 05/27/7828  5:62 AM  Serum Sodium: 138 mmol/L (Using max of 137 mmol/L) at 04/11/2021  6:06 AM  Total Bilirubin: 10.4 mg/dL at 10/13/8655  8:46 PM  INR(ratio): 1.7 at 04/11/2021  5:55  PM  Age: 25 years  ??  - Liver and GYN consulted; appreciate recs  - Monitoring am labs  - Neuro check q4h  ??  - Continue Azathioprine 100 daily   - Continue Prednisone 60 daily, with plans to continue on discharge until Liver follow-up  - Continue PRN Oxycodone 5 q4h, for abdominal pain      - If additional medication needed, please page GI junior.  - Continue PRN Phenergan, Zofran, for nausea  - F/u CBC, INR from am 07/31  - Planning for Liver Biopsy tomorrow @ 0930 with Dr Faylene Million   ??  #Hypoglycemia   - Seen on AM labs 07/29. Concerning for decreased hepatic glycogen reserve/ gluconeogenesis function. Repeat POC glucose improved.  - POC glucose qAC/HS   - Hypoglycemia protocol  ??  #Pruritis  Secondary to hyperbilirubinemia. Refractory to benadryl and hydroxyzine  - Continue Cholestyramine 4g BID  - Continue Ursodiol  ??  #Lower extremity edema  Likely secondary to decreased mobility with venous stasis, vasodilation related to recent IVF hormones. Abdominal US on admission negative for thrombosis.  -??Hold??Hydrochlorothiazide 25 daily  - Continue lasix 20mg  IV  - Thigh high compression stockings  ??  #Blood Cultures, positive for Micrococcus  Likely contaminant with repeat cultures negative.   - S/p vancomycin 7/27  ??  #PCOS  #Infertility, on IVF  - Prenatal Vitamin daily  ??  #Hypothyroidism  - Levothyroxine daily   ??  #Asthma  - Albuterol inhaler PRN   ??  Code Status:??Full  DVT Prophylaxis:??SubQ Hep  Nutrition:??Reg  Disposition Plan:??GI floor  ??      Arvid Right, MD  PGY1 Internal Medicine  Pager: [phone]  04/12/2021, 5:50 AM

## 2021-04-13 LAB — CBC
Hematocrit: 26.6 % (ref 35.0–45.0)
Hemoglobin: 9.7 g/dL (ref 11.7–15.5)
MCH: 40.9 pg (ref 27.0–33.0)
MCHC: 36.5 g/dL (ref 32.0–36.0)
MCV: 112.3 fL (ref 80.0–100.0)
MPV: 8.2 fL (ref 7.5–11.5)
Platelets: 127 10*3/uL (ref 140–400)
RBC: 2.37 10*6/uL (ref 3.80–5.10)
RDW: 25.1 % (ref 11.0–15.0)
WBC: 0.9 10*3/uL (ref 3.8–10.8)

## 2021-04-13 LAB — RENAL FUNCTION PANEL W/EGFR
Albumin: 2.4 g/dL (ref 3.5–5.7)
Anion Gap: 3 mmol/L (ref 3–16)
BUN: 13 mg/dL (ref 7–25)
CO2: 25 mmol/L (ref 21–33)
Calcium: 7.6 mg/dL (ref 8.6–10.3)
Chloride: 109 mmol/L (ref 98–110)
Creatinine: 0.61 mg/dL (ref 0.60–1.30)
EGFR: >90
Glucose: 95 mg/dL (ref 70–100)
Osmolality, Calculated: 284 mOsm/kg (ref 278–305)
Phosphorus: 3.4 mg/dL (ref 2.1–4.7)
Potassium: 4.6 mmol/L (ref 3.5–5.3)
Sodium: 137 mmol/L (ref 133–146)

## 2021-04-13 LAB — DIFFERENTIAL
Basophils Absolute: 10 /uL (ref 0–200)
Basophils Relative: 1.1 % (ref 0.0–1.0)
Eosinophils Absolute: 6 /uL (ref 15–500)
Eosinophils Relative: 0.7 % (ref 0.0–8.0)
Lymphocytes Absolute: 212 /uL (ref 850–3900)
Lymphocytes Relative: 23.6 % (ref 15.0–45.0)
Monocytes Absolute: 59 /uL (ref 200–950)
Monocytes Relative: 6.5 % (ref 0.0–12.0)
Neutrophils Absolute: 613 /uL — ABNORMAL LOW (ref 1500–7800)
Neutrophils Relative: 68.1 % (ref 40.0–80.0)
nRBC: 1 /100{WBCs} — ABNORMAL HIGH (ref 0–0)

## 2021-04-13 LAB — THIOPURINE METABOLITES
6-MMPN: 1888 pmol/8x 10E8
6-TGN: 391 pmol/8x 10E8

## 2021-04-13 LAB — PROTIME-INR
INR: 1.7 (ref 0.9–1.1)
Protime: 20.8 seconds (ref 12.1–15.1)

## 2021-04-13 LAB — HEPATIC FUNCTION PANEL
ALT: 210 U/L (ref 7–52)
AST: 159 U/L (ref 13–39)
Albumin: 2.4 g/dL (ref 3.5–5.7)
Alkaline Phosphatase: 66 U/L (ref 36–125)
Bilirubin, Direct: 4.23 mg/dL (ref 0.00–0.40)
Bilirubin, Indirect: 5.77 mg/dL (ref 0.00–1.10)
Total Bilirubin: 10 mg/dL (ref 0.0–1.5)
Total Protein: 5.1 g/dL (ref 6.4–8.9)

## 2021-04-13 LAB — MAGNESIUM: Magnesium: 2 mg/dL (ref 1.5–2.5)

## 2021-04-13 LAB — POC GLU MONITORING DEVICE: POC Glucose Monitoring Device: 115 mg/dL (ref 70–100)

## 2021-04-13 MED ORDER — furosemide (LASIX) injection 20 mg
10 | Freq: Every day | INTRAMUSCULAR | Status: AC
Start: 2021-04-13 — End: 2021-04-14
  Administered 2021-04-13: 17:00:00 20 mg via INTRAVENOUS

## 2021-04-13 MED ORDER — diphenhydrAMINE (BENADRYL) capsule 25 mg
25 | Freq: Once | ORAL | Status: AC
Start: 2021-04-13 — End: 2021-04-13
  Administered 2021-04-13: 06:00:00 25 mg via ORAL

## 2021-04-13 MED ORDER — pantoprazole (PROTONIX) EC tablet 40 mg
40 | Freq: Every day | ORAL | Status: AC
Start: 2021-04-13 — End: 2021-04-15
  Administered 2021-04-13 – 2021-04-14 (×2): 40 mg via ORAL

## 2021-04-13 MED ORDER — furosemide (LASIX) injection 20 mg
10 | Freq: Once | INTRAMUSCULAR | Status: AC
Start: 2021-04-13 — End: 2021-04-13

## 2021-04-13 MED FILL — PANTOPRAZOLE 40 MG TABLET,DELAYED RELEASE: 40 40 MG | ORAL | Qty: 1

## 2021-04-13 MED FILL — OXYCODONE 5 MG TABLET: 5 5 MG | ORAL | Qty: 1

## 2021-04-13 MED FILL — PREDNISONE 20 MG TABLET: 20 20 MG | ORAL | Qty: 3

## 2021-04-13 MED FILL — PRENATAL PLUS (CALCIUM CARBONATE) 27 MG IRON-1 MG TABLET: 27 27 mg iron- 1 mg | ORAL | Qty: 1

## 2021-04-13 MED FILL — ONDANSETRON HCL (PF) 4 MG/2 ML INJECTION SOLUTION: 4 4 mg/2 mL | INTRAMUSCULAR | Qty: 2

## 2021-04-13 MED FILL — DIPHENHYDRAMINE 25 MG CAPSULE: 25 25 mg | ORAL | Qty: 1

## 2021-04-13 MED FILL — TYLENOL 325 MG TABLET: 325 325 mg | ORAL | Qty: 1

## 2021-04-13 MED FILL — PREVALITE 4 GRAM POWDER FOR SUSP IN A PACKET: 4 4 gram | ORAL | Qty: 1

## 2021-04-13 MED FILL — LEVOTHYROXINE 112 MCG TABLET: 112 112 MCG | ORAL | Qty: 1

## 2021-04-13 MED FILL — URSODIOL 300 MG CAPSULE: 300 300 mg | ORAL | Qty: 1

## 2021-04-13 MED FILL — FUROSEMIDE 10 MG/ML INJECTION SOLUTION: 10 10 mg/mL | INTRAMUSCULAR | Qty: 4

## 2021-04-13 NOTE — Unmapped (Signed)
HEPATOLOGY PROGRESS NOTE    ID: Caffie Sotto is a 25 y.o. 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 States that symptoms of dizziness, jaundice started about 1.5 - 2 weeks ago, shortly after the start of starting injection as part of an In vitro fertilization protocol. On arrival liver enzymes were notable for AlkP of 106, AST/ALT of 623/463 and tbili 13.8, having all been WNL on 12/16/20. Started on Prednisone 40mg  on 7/26.    Interim:  -Abdominal pain mildly improved\\  -LFTs slightly improved  -Worsening leukopenia with neutropenia to 613  -Ongoing bruising of legs.  -Swelling improved with lasix but reports was only given one dose.      ROS:  10 pt ROS negative unless otherwise noted above    Vitals:  Temp:  [98.1 ??F (36.7 ??C)-98.5 ??F (36.9 ??C)] 98.3 ??F (36.8 ??C)  Heart Rate:  [76-96] 86  Resp:  [14-18] 16  BP: (100-131)/(51-68) 100/52    Intake/Output Summary (Last 24 hours) at 04/13/2021 1805  Last data filed at 04/13/2021 0526  Gross per 24 hour   Intake 20 ml   Output --   Net 20 ml       Physical Exam   Gen: Well-developed, well nourished. No acute distress.   HEENT: NC/AT, EOMI, moist mucus membranes  CV: RRR, no m/r/g, no LE edema  Lungs: Clear to auscultation bilaterally.   Abdomen: +BS, soft, mild tenderness worse in RUQ  Extremities: warm, well perfused. Bilateral pitting edema of legs  Neuro: A&Ox3, moving all extremities spontaneous, no asterixis   Skin: no bruising/rashes/jaundice  Psych: normal affect    MELD-Na score: 21 at 04/13/2021  5:22 AM  Calculated from:  Serum Creatinine: 0.61 mg/dL (Using min of 1 mg/dL) at 09/18/1094  0:45 AM  Serum Sodium: 137 mmol/L at 04/13/2021  5:22 AM  Total Bilirubin: 10.0 mg/dL at 4/0/9811  9:14 AM  INR(ratio): 1.7 at 04/13/2021  5:22 AM  Age: 18 years        Assessment/Plan:   Yuleni Burich is a 25 y.o. 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  ??  ??  #Elevated liver enzymes, history of AIH: significant, acute elevation in liver enzymes coincides with recent IVF treatment about 2 weeks ago. While flare of autoimmune hepatitis is high on the differential, it is also possible that elevations are related to Ovarian hyperstimulation syndrome with a more severe presentation given her underlying cirrhosis. Gyn has less concern for this. Infectious etiology also need to be ruled out however are lower on the differential  ??  Recommendations:  -Continue Prednisone 60mg  daily  -Increase ursodiol to 600mg  BID  -Hold Imuran 100mg  for now  -Check TPMT  -Consider repeat lasix for LE swelling  -Holding on liver biopsy due to neutropenia.    Samson Frederic, MD  Gastroenterology/Hepatology Fellow- PGY 6  Pager [phone]

## 2021-04-13 NOTE — Unmapped (Signed)
Attending Physician's Note:  I saw and examined the patient on 04/13/2021. I discussed with the fellow and agree with fellow's findings and plan as documented in the note. Copied / pasted information was reviewed and confirmed to be accurate. >10 point POS was performed and negative unless otherwise documented in the note. Please also see below.       25 y.o. woman with complex PMH of Autoimmune hepatitis cirrhosis and PCOS who was admitted for abdominal pain, elevated liver chemistries.     ??   Hepatology consulted for co-management of acute elevated liver chemistries.       Pt transvaginal ultrasound showed cysts suggestive of ovarian stimulation     Gynecology not concerned for ovarian hyperstimulation     04/11/2021 CT abdomen, pelvis with IV contrast noted no acute process       Interval history 04/13/2021  Pt reported stable abdomen pain    Pt tolerating higher dose of prednisone     Liver chemistries slowly trending down with higher immunosuppression     WBC dropping 2.8 -> 1.8 -> 0.9 K    No fever, chills, or localizing symptoms for infection       PE  Vitals:    04/13/21 0413 04/13/21 0800 04/13/21 0838 04/13/21 1214   BP: 102/63  108/51 111/68   BP Location: Right arm  Right arm Right arm   Patient Position: Lying  Lying Lying   Pulse: 76 78 79 92   Resp: 16  18 18    Temp: 98.2 ??F (36.8 ??C)  98.1 ??F (36.7 ??C) 98.1 ??F (36.7 ??C)   TempSrc: Oral  Oral Oral   SpO2: 98% 99% 99% 98%   Weight:         General: awake, alert, no apparent distress   HENT: sclera anicteric, moist mucous membranes   Neck: supple    Pulmonary: good air exchange, bilateral breath sounds    Cardiac: S1 and S2 heard    Abdomen:soft, + tender, non-distended stomach, normoactive bowel sounds, no fluid wave, no guarding or rebound, no hepatomegaly, no splenomegaly    Neuro: awake, alert, oriented x 3, no confusion   Extremities: no pedal edema    Skin: + jaundice     MELD-Na score: 21 at 04/13/2021  5:22 AM  MELD score: 21 at 04/13/2021  5:22  AM  Calculated from:  Serum Creatinine: 0.61 mg/dL (Using min of 1 mg/dL) at 0/10/7251  6:64 AM  Serum Sodium: 137 mmol/L at 04/13/2021  5:22 AM  Total Bilirubin: 10.0 mg/dL at 4/0/3474  2:59 AM  INR(ratio): 1.7 at 04/13/2021  5:22 AM  Age: 69 years    A&P  25 y.o. year old female with complex medical issues as detailed in HPI    Acute elevated liver chemistries: predominant hepatocellular injury with bilirubin up to 14 and INR 1.9 in setting of autoimmune hepatitis cirrhosis. Concern for autoimmune hepatitis flare.  Negative viral hepatitis serologies and no evidence of perihepatic vascular thrombosis.  04/13/2021 liver chemistries slowly trending down.  Concern for drug-related leukopenia, neutropenia.     - hold off liver biopsy in setting of neutropenia   - hold azathioprine for now   - check thiopurine metabolites    - continue prednisone 60 mg daily   - pain control as needed   - monitor CBC, liver chemistries and INR daily     This note was completely edited, written and reviewed by me and consists of information cut and pasted from the  my most recent visit, my smart phrases and other Epic tools. I have personally reviewed all aspects of this note to at least include reviewing this patient's chart and problem list, updating the history, physical exam, lab and procedure results, and assessment and plan as detailed above and below.  As such this visit note reflects my current evaluation and management for this patient.    I have reviewed all prior notes, labs, imaging and path reports. I have reviewed interval / recent lab work including: CBC, renal, hepatic, INR, viral studies,  and all other labs for digestive disease relating to their medical condition. I have independently reviewed the patients recent radiologic imaging including any CT scans, MRI, ultrasound or endoscopic procedures. Finally, I have reviewed independently any interval GI and liver pathology.    Rennie Natter, MD, MPH  Transplant hepatology    Pager: [phone]

## 2021-04-13 NOTE — Unmapped (Signed)
Team at bedside.

## 2021-04-13 NOTE — Unmapped (Signed)
[facility]  Case Management/Social Work Department  Progress Note    Patient Information     Hospital day: 6  Inpatient/Observation:  Inpatient   Level of Care:  Floor status  Admit date:  04/07/2021  Admission diagnosis: HEPATITIS    PMH:  has a past medical history of Acne, Amenorrhea, Autoimmune hepatitis (CMS Dx) (03/2016), Bilateral leg edema, Cirrhosis (CMS Dx), Dysmenorrhea, Esophageal varices (CMS Dx) (05/2016), H/O infertility, and PCOS (polycystic ovarian syndrome).    PCP:  Faylene Million, DO    Home Pharmacy:    [facility] [place] [employer], [place] - [address]  [address]  [address]  [place] [address]  Phone: [phone]     [facility] [address] [place], [place] - [address]  [address] [place] [address]  Phone: [phone]         Medical Insurance Coverage:  Payor: [phone] TRICARE / Plan: [facility] MILITARY TRICARE / Product Type: *No Product type* /     Other Pertinent Information     CM has completed a chart review and completed rounds with the team. Per team, the pt is not medically ready for D/C today. Team advised, awaiting liver biopsy at this time.     Discharge Plan     Anticipated discharge plan:  Home with no needs     Anticipated discharge date:  08/06    CM/SW will continue to follow and remain available for discharge planning needs.      Sol Passer, RN     Cell [phone]

## 2021-04-13 NOTE — Unmapped (Signed)
Department of Internal Medicine  Daily Progress Note      Chief Complaint / Reason for Follow-up     Tracy Watkins is a 25 y.o. female on hospital day 6. The principal reason for today's follow up visit is Elevated liver enzymes.    Interval History / Subjective     NAEO. Pt remains unchanged from prior days. Epigastric pain and nausea still present but have required no additional PRNs overnight.    AM Labs showed markedly reduced WBC 0.9 and ANC 613    ROS:   General: No fever, chills  Eyes: No vision changes  Cardiovascular: No chest pain, palpitations  Lungs: No SOB, cough  GI: + intermittent nausea; + epigastric pain, decreased from prior days  MSK: No muscle/joint pain; + BLE edema  GU: No dysuria; no changes in frequency  Neuro: No dizziness, lightheadedness, LOC  Derm: No rashes, lesions, itching        Physical Exam     Physical Exam  Vitals reviewed.   Constitutional:       Appearance: She is obese.   HENT:      Head: Normocephalic and atraumatic.      Mouth/Throat:      Pharynx: Oropharynx is clear.   Eyes:      General: Scleral icterus present.      Conjunctiva/sclera: Conjunctivae normal.      Comments: Mild scleral icterus   Cardiovascular:      Rate and Rhythm: Normal rate and regular rhythm.      Heart sounds: Normal heart sounds.   Pulmonary:      Effort: Pulmonary effort is normal.   Abdominal:      General: Bowel sounds are normal. There is no distension.      Tenderness: There is abdominal tenderness. There is no guarding or rebound.   Musculoskeletal:         General: Normal range of motion.      Cervical back: Normal range of motion.      Right lower leg: Edema present.      Left lower leg: Edema present.   Skin:     General: Skin is warm and dry.   Neurological:      General: No focal deficit present.      Mental Status: She is alert.   Psychiatric:         Mood and Affect: Mood normal.         Behavior: Behavior normal.          Diagnostic Data     All laboratory data was reviewed and  relevant values are noted as below.     Latest Reference Range & Units 04/11/21 17:55 04/12/21 05:32 04/13/21 05:22   Alkaline Phosphatase 36 - 125 U/L 77 67 66   AST (SGOT) 13 - 39 U/L 218 (H) 200 (H) 159 (H)   ALT (SGPT) 7 - 52 U/L 262 (H) 233 (H) 210 (H)   Albumin 3.5 - 5.7 g/dL 2.7 (L) 2.6 (L) 2.4 (L)   Protein, Total 6.4 - 8.9 g/dL 5.4 (L) 5.2 (L) 5.1 (L)   Bilirubin, Indirect 0.00 - 1.10 mg/dL 1.61 (H) 0.96 (H) 0.45 (H)   Bili, Total 0.0 - 1.5 mg/dL 40.9 (H) 9.9 (H) 81.1 (H)   Bilirubin, Direct 0.00 - 0.40 mg/dL 9.14 (H) 7.82 (H) 9.56 (H)   (H): Data is abnormally high  (L): Data is abnormally low     Latest Reference Range & Units 04/10/21 06:03  04/11/21 06:06 04/12/21 07:56 04/13/21 05:22   WBC 3.8 - 10.8 10E3/uL 2.8 (L) 1.8 (L) 1.8 (L) 0.9 (LL)   RBC 3.80 - 5.10 10E6/uL 2.54 (L) 2.32 (L) 2.61 (L) 2.37 (L)   Hemoglobin 11.7 - 15.5 g/dL 47.8 (L) 9.4 (L) 29.5 (L) 9.7 (L)   Hematocrit 35.0 - 45.0 % 28.1 (L) 26.0 (L) 29.7 (L) 26.6 (L)   MCV 80.0 - 100.0 fL 110.8 (H) 111.8 (H) 113.9 (H) 112.3 (H)   MCH 27.0 - 33.0 pg 39.9 (H) 40.6 (H) 40.1 (H) 40.9 (H)   MCHC 32.0 - 36.0 g/dL 62.1 30.8 (H) 65.7 84.6 (H)   RDW 11.0 - 15.0 % 23.9 (H) 23.7 (H) 24.6 (H) 25.1 (H)   Platelet Count 140 - 400 10E3/uL 110 (L) 87 (L) 95 (L) 127 (L)   MPV 7.5 - 11.5 fL 7.3 (L) 7.6 7.9 8.2   (L): Data is abnormally low  (H): Data is abnormally high  (LL): Data is critically low      Latest Reference Range & Units 04/09/21 05:53 04/12/21 07:56 04/13/21 05:22   Neutrophils Relative 40.0 - 80.0 % 64.7 51.3 68.1   Lymphocytes Relative 15.0 - 45.0 % 28.6 34.9 23.6   Monocytes Relative 0.0 - 12.0 % 4.8 8.9 6.5   Eosinophils Relative 0.0 - 8.0 % 1.9 4.6 0.7   Basophils Relative 0.0 - 1.0 % 0.0 0.3 1.1 (H)   nRBC 0 - 0 /100 WBC 2 (H) 1 (H) 1 (H)   Neutrophils Absolute 1,500 - 7,800 /uL 1,488 (L) 923 (L) 613 (L)   Absolute Lymphocytes 850 - 3,900 /uL 658 (L) 628 (L) 212 (L)   Monocytes Absolute 200 - 950 /uL 110 (L) 160 (L) 59 (L)   Eosinophils  Absolute 15 - 500 /uL 44 83 6 (L)   Basophils Absolute 0 - 200 /uL 0 5 10   Hypochromasia  Increased     Macrocytosis  Increased     (H): Data is abnormally high  (L): Data is abnormally low    Imaging:   None.    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  ??  #Acute liver injury  #Cirrhosis d/b Ascites, NBEV, 2/2 AIH  - C/f flare of autoimmune hepatitis flare vs Drug Induced Liver Injury, in the setting of recent IVF. Liver enzymes, bilirubin, INR with initial improvement that has now plateaued. Still considerable concern, especially given plateauted liver chemistries and recent episodes of hypoglycemia.   ??  MELD-Na score: 21 at 04/11/2021  5:55 PM  ??  - Liver and GYN consulted; appreciate recs  - Monitoring am labs  - Neuro check q4h  - Continue PRN Oxycodone 5 q4h, for abdominal pain      - If additional medication needed, please page GI junior.  - Continue PRN Phenergan, Zofran, for nausea  ??  - Continue Prednisone 60 daily, with plans to continue on discharge until Liver follow-up  - Planning for Liver Biopsy, once WBC have returned to baseline  - Hold Azathioprine, in the setting of neutropenia (see below)    #Neutropenia, moderate  - WBC 0.9, ANC 613. Likely 2/2 Azathioprine vs splenic sequestration related to cirrhosis.   - Discontinued Azathioprine  - F/u thiopurine  metabolites  - Continue to monitor CBC  ??  #Hypoglycemia??  -??Seen on AM labs??07/29. Concerning for decreased hepatic glycogen reserve/ gluconeogenesis function. Repeat POC glucose improved.  - POC glucose qAC/HS   - Hypoglycemia protocol  ??  #Pruritis  Secondary to hyperbilirubinemia. Refractory to benadryl and hydroxyzine  -??Continue??Cholestyramine 4g BID  -??Continue??Ursodiol  ??  #Lower extremity edema  Likely secondary to  decreased mobility with venous stasis, vasodilation related to recent IVF hormones. Abdominal US on admission negative for thrombosis.  -??Hold??Hydrochlorothiazide 25 daily  -??Continue??lasix 20mg  IV  - Thigh high compression stockings  ??  #Blood Cultures, positive for Micrococcus  Likely contaminant with repeat cultures negative.   - S/p vancomycin 7/27  ??  #PCOS  #Infertility, on IVF  - Prenatal Vitamin daily  ??  #Hypothyroidism  - Levothyroxine daily   ??  #Asthma  - Albuterol inhaler PRN   ??  Code Status:??Full  DVT Prophylaxis:??SubQ Hep  Nutrition:??Reg  Disposition Plan:??GI floor  ??  ??    Arvid Right, MD  PGY1 Internal Medicine  Pager: [phone]  04/13/2021, 6:27 AM

## 2021-04-14 ENCOUNTER — Inpatient Hospital Stay: Admit: 2021-04-14 | Payer: TRICARE (CHAMPUS)

## 2021-04-14 LAB — CBC
Hematocrit: 26.2 % — ABNORMAL LOW (ref 35.0–45.0)
Hemoglobin: 9.2 g/dL — ABNORMAL LOW (ref 11.7–15.5)
MCH: 40.3 pg — ABNORMAL HIGH (ref 27.0–33.0)
MCHC: 35 g/dL (ref 32.0–36.0)
MCV: 115.1 fL — ABNORMAL HIGH (ref 80.0–100.0)
MPV: 7.7 fL (ref 7.5–11.5)
Platelets: 81 10E3/uL — ABNORMAL LOW (ref 140–400)
RBC: 2.28 10E6/uL — ABNORMAL LOW (ref 3.80–5.10)
RDW: 25.4 % — ABNORMAL HIGH (ref 11.0–15.0)
WBC: 1.7 10E3/uL — ABNORMAL LOW (ref 3.8–10.8)

## 2021-04-14 LAB — DIFFERENTIAL
Basophils Absolute: 15 /uL (ref 0–200)
Basophils Relative: 0.9 % (ref 0.0–1.0)
Eosinophils Absolute: 63 /uL (ref 15–500)
Eosinophils Relative: 3.7 % (ref 0.0–8.0)
Lymphocytes Absolute: 724 /uL — ABNORMAL LOW (ref 850–3900)
Lymphocytes Relative: 42.6 % (ref 15.0–45.0)
Monocytes Absolute: 282 /uL (ref 200–950)
Monocytes Relative: 16.6 % — ABNORMAL HIGH (ref 0.0–12.0)
Neutrophils Absolute: 615 /uL — ABNORMAL LOW (ref 1500–7800)
Neutrophils Relative: 36.2 % — ABNORMAL LOW (ref 40.0–80.0)
nRBC: 1 /100{WBCs} — ABNORMAL HIGH (ref 0–0)

## 2021-04-14 LAB — POC GLU MONITORING DEVICE: POC Glucose Monitoring Device: 133 mg/dL (ref 70–100)

## 2021-04-14 LAB — RENAL FUNCTION PANEL W/EGFR
Albumin: 2.2 g/dL — ABNORMAL LOW (ref 3.5–5.7)
Anion Gap: 5 mmol/L (ref 3–16)
BUN: 14 mg/dL (ref 7–25)
CO2: 28 mmol/L (ref 21–33)
Calcium: 7.9 mg/dL — ABNORMAL LOW (ref 8.6–10.3)
Chloride: 107 mmol/L (ref 98–110)
Creatinine: 0.65 mg/dL (ref 0.60–1.30)
EGFR: >90
Glucose: 92 mg/dL (ref 70–100)
Osmolality, Calculated: 290 mosm/kg (ref 278–305)
Phosphorus: 2.6 mg/dL (ref 2.1–4.7)
Potassium: 3.2 mmol/L — ABNORMAL LOW (ref 3.5–5.3)
Sodium: 140 mmol/L (ref 133–146)

## 2021-04-14 LAB — HEPATIC FUNCTION PANEL
ALT: 185 U/L — ABNORMAL HIGH (ref 7–52)
AST: 109 U/L — ABNORMAL HIGH (ref 13–39)
Albumin: 2.2 g/dL — ABNORMAL LOW (ref 3.5–5.7)
Alkaline Phosphatase: 81 U/L (ref 36–125)
Bilirubin, Direct: 5.07 mg/dL — ABNORMAL HIGH (ref 0.00–0.40)
Bilirubin, Indirect: 4.03 mg/dL — ABNORMAL HIGH (ref 0.00–1.10)
Total Bilirubin: 9.1 mg/dL — ABNORMAL HIGH (ref 0.0–1.5)
Total Protein: 4.8 g/dL — ABNORMAL LOW (ref 6.4–8.9)

## 2021-04-14 LAB — MAGNESIUM: Magnesium: 1.9 mg/dL (ref 1.5–2.5)

## 2021-04-14 MED ORDER — potassium chloride (KLOR-CON M20) CR tablet 40 mEq
20 | Freq: Once | ORAL | Status: AC
Start: 2021-04-14 — End: 2021-04-14
  Administered 2021-04-15: 40 meq via ORAL

## 2021-04-14 MED ORDER — potassium chloride (KLOR-CON M20) CR tablet 40 mEq
20 | Freq: Once | ORAL | Status: AC
Start: 2021-04-14 — End: 2021-04-14
  Administered 2021-04-14: 22:00:00 40 meq via ORAL

## 2021-04-14 MED ORDER — alteplase 1mg/mL in sterile water (ACTIVASE) 1 mg in sterile water (PF) syringe for line care
100 | INTRAVENOUS | Status: AC | PRN
Start: 2021-04-14 — End: 2021-04-15

## 2021-04-14 MED ORDER — potassium chloride (KCl)/Sterile water 100 mL 10 mEq/100 mL IVPB 10 mEq
10 | INTRAVENOUS | Status: AC
Start: 2021-04-14 — End: 2021-04-14
  Administered 2021-04-14: 19:00:00 10 meq via INTRAVENOUS

## 2021-04-14 MED ORDER — sodium chloride flush 3-30 mL
INTRAMUSCULAR | Status: AC
Start: 2021-04-14 — End: 2021-04-15
  Administered 2021-04-14: 15:00:00 30 mL
  Administered 2021-04-15: 01:00:00 10 mL

## 2021-04-14 MED ORDER — sodium chloride 0.9 % infusion
INTRAVENOUS | Status: AC | PRN
Start: 2021-04-14 — End: 2021-04-15

## 2021-04-14 MED ORDER — ursodioL (ACTIGALL) capsule 600 mg
300 | Freq: Two times a day (BID) | ORAL | Status: AC
Start: 2021-04-14 — End: 2021-04-15
  Administered 2021-04-14 – 2021-04-15 (×3): 600 mg via ORAL

## 2021-04-14 MED ORDER — lidocaine (PF) (XYLOCAINE) 10 mg/mL (1 %) 5 mL
10 | Freq: Once | INTRAMUSCULAR | Status: AC
Start: 2021-04-14 — End: 2021-04-14
  Administered 2021-04-14: 15:00:00 5 mL via SUBCUTANEOUS

## 2021-04-14 MED ORDER — furosemide (LASIX) injection 20 mg
10 | Freq: Once | INTRAMUSCULAR | Status: AC
Start: 2021-04-14 — End: 2021-04-14
  Administered 2021-04-14: 19:00:00 20 mg via INTRAVENOUS

## 2021-04-14 MED ORDER — sodium chloride flush 3-30 mL
INTRAMUSCULAR | Status: AC | PRN
Start: 2021-04-14 — End: 2021-04-15

## 2021-04-14 MED ORDER — sodium chloride 0.9% 30 mL
Freq: Once | INTRAMUSCULAR | Status: AC
Start: 2021-04-14 — End: 2021-04-14
  Administered 2021-04-14: 15:00:00 30 mL

## 2021-04-14 MED FILL — PREVALITE 4 GRAM POWDER FOR SUSP IN A PACKET: 4 4 gram | ORAL | Qty: 1

## 2021-04-14 MED FILL — PREDNISONE 20 MG TABLET: 20 20 MG | ORAL | Qty: 3

## 2021-04-14 MED FILL — FUROSEMIDE 10 MG/ML INJECTION SOLUTION: 10 10 mg/mL | INTRAMUSCULAR | Qty: 4

## 2021-04-14 MED FILL — PROMETHAZINE 25 MG/ML INJECTION SOLUTION: 25 25 mg/mL | INTRAMUSCULAR | Qty: 1

## 2021-04-14 MED FILL — ONDANSETRON HCL (PF) 4 MG/2 ML INJECTION SOLUTION: 4 4 mg/2 mL | INTRAMUSCULAR | Qty: 2

## 2021-04-14 MED FILL — HEPARIN (PORCINE) 5,000 UNIT/ML INJECTION SOLUTION: 5000 5,000 unit/mL | INTRAMUSCULAR | Qty: 1

## 2021-04-14 MED FILL — URSODIOL 300 MG CAPSULE: 300 300 mg | ORAL | Qty: 2

## 2021-04-14 MED FILL — PANTOPRAZOLE 40 MG TABLET,DELAYED RELEASE: 40 40 MG | ORAL | Qty: 1

## 2021-04-14 MED FILL — XYLOCAINE-MPF 10 MG/ML (1 %) INJECTION SOLUTION: 10 10 mg/mL (1 %) | INTRAMUSCULAR | Qty: 6

## 2021-04-14 MED FILL — POTASSIUM CHLORIDE 10 MEQ/100ML IN STERILE WATER INTRAVENOUS PIGGYBACK: 10 10 mEq/100 mL | INTRAVENOUS | Qty: 100

## 2021-04-14 MED FILL — SODIUM CHLORIDE 0.9 % INJECTION SOLUTION: 30.00 30.00 mL | INTRAMUSCULAR | Qty: 30

## 2021-04-14 MED FILL — LEVOTHYROXINE 112 MCG TABLET: 112 112 MCG | ORAL | Qty: 1

## 2021-04-14 MED FILL — OXYCODONE 5 MG TABLET: 5 5 MG | ORAL | Qty: 1

## 2021-04-14 MED FILL — POTASSIUM CHLORIDE ER 20 MEQ TABLET,EXTENDED RELEASE(PART/CRYST): 20 20 MEQ | ORAL | Qty: 2

## 2021-04-14 MED FILL — PRENATAL PLUS (CALCIUM CARBONATE) 27 MG IRON-1 MG TABLET: 27 27 mg iron- 1 mg | ORAL | Qty: 1

## 2021-04-14 NOTE — Unmapped (Signed)
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  GI following- pt with hypoglycemia, concerning for decreased haptic glycogen reserve/gluconeogenesis function. Per CBORD review, pt ordering ~3 meals per day. Last BM: 8/1. K 3.2.   Spoke with pt at bedside this afternoon. Pt reported a fair appetite, finishing a little more than 50% of her meals. Stated she has some nausea after she eats but no V/D/C.    Patient seen by nutrition services per LOS protocol. Nutrition Screening reviewed, no screen completed.    Patient Active Problem List   Diagnosis   ??? Pre-liver transplant, listed   ??? Autoimmune hepatitis (CMS Dx)   ??? Cirrhosis of liver (CMS Dx)   ??? Other acne   ??? PCOS (polycystic ovarian syndrome)   ??? Hyperandrogenemia   ??? Hyperandrogenism   ??? Infertility, anovulation   ??? Class 3 severe obesity due to excess calories without serious comorbidity in adult (CMS Dx)   ??? COVID   ??? Elevated liver enzymes     Past Medical History:   Diagnosis Date   ??? Acne    ??? Amenorrhea    ??? 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 Bilateral leg edema    ??? Cirrhosis (CMS Dx)    ??? Dysmenorrhea    ??? Esophageal varices (CMS Dx) 05/2016    Grade 1/small, no history of bleeding   ??? H/O infertility    ??? PCOS (polycystic ovarian syndrome)        Scheduled Meds:   ??? cholestyramine-aspartame  4 g Oral BID   ??? heparin  5,000 Units Subcutaneous Q8H   ??? levothyroxine  112 mcg Oral QAM AC   ??? pantoprazole  40 mg Oral DAILY 0600   ??? PNV,calcium 72-iron-folic acid  1 tablet Oral Daily 0900   ??? predniSONE  60 mg Oral Daily 0900   ??? sodium  chloride  10 mL Intravenous QS   ??? sodium chloride  3-30 mL Intracatheter QS   ??? ursodioL  600 mg Oral BID      Continuous Infusions:   PRN Meds:acetaminophen, albuterol, alteplase (TPA) 1mg /ml intracatheter syringe for line care, dextrose 10% in water **OR** dextrose 10% in water, glucose, ondansetron, oxyCODONE **OR** [DISCONTINUED] oxyCODONE, proMETHazine, sodium chloride 0.9 %, sodium chloride     Pertinent Labs:   Lab Results   Component Value Date    CREATININE 0.65 04/14/2021    BUN 14 04/14/2021    NA 140 04/14/2021    K 3.2 (L) 04/14/2021    CL 107 04/14/2021    CO2 28 04/14/2021     Lab Results   Component Value Date    ALBUMIN 2.2 (L) 04/14/2021    ALBUMIN 2.2 (L) 04/14/2021     No results found for: PREALBUMIN  Lab Results   Component Value Date    CALCIUM 7.9 (L) 04/14/2021    PHOS 2.6 04/14/2021     Lab Results   Component Value Date    MG 1.9 04/14/2021     Lab Results   Component Value Date    POCGMD 115 (H) 04/13/2021  Lab Results   Component Value Date    HGBA1C 4.5 (L) 04/16/2020     No results found for: CRP    Skin Integrity: WNL  Braden Score: 20, at   Edema: BLE    Potential Nutrition Related Factor(s):  Nausea/Vomiting and Appetite Change  Food Allergies/Intolerances: NKFA  Cultural Requests: none noted    25 y.o.   Female   Ht Readings from Last 1 Encounters:   02/05/21 5' 6 (1.676 m)     Wt Readings from Last 1 Encounters:   04/14/21 (!) 248 lb 8 oz (112.7 kg)        Body mass index is 40.11 kg/m??.   Usual Weight: see below  Ideal Body Weight: 130 lb (59.1 kg) +/- 10%  Other: 191% IBW  Weight History:   Wt Readings from Last 15 Encounters:   04/14/21 (!) 248 lb 8 oz (112.7 kg)   02/05/21 (!) 230 lb (104.3 kg)   01/23/21 (!) 239 lb 9.6 oz (108.7 kg)   07/25/20 (!) 238 lb 12.8 oz (108.3 kg)   06/05/20 (!) 241 lb (109.3 kg)   05/08/20 (!) 242 lb (109.8 kg)   05/11/17 207 lb (93.9 kg)   04/16/20 (!) 251 lb (113.9 kg)   01/28/20 (!) 251 lb (113.9 kg)   07/09/19 (!) 230 lb (104.3 kg)    07/17/18 209 lb (94.8 kg)   01/23/18 209 lb (94.8 kg)   07/18/17 207 lb (93.9 kg)   03/14/17 199 lb (90.3 kg)   01/17/17 195 lb (88.5 kg)     Estimated Nutrition Needs:   Needs based On: IBW 59.1 kg  Kcals/day: 1478-1773 kcal (25-30 kcal/kg)  Protein g/day: 89 g (1.5 g/kg)  Carbohydrate g/day: no hx of DM  Fluid ml/day: 1 mL/kcal or per MD    Nutrition Related Problems:   Nutrition Diagnosis: Inadequate oral intake  Related To: decreased appetite  As Evidenced By: PO intake <75% of meals    Recommended Interventions: Monitor PO Intake/Tolerance  Goals:Total energy intake improved as evidenced by PO intake at least 50% of meals/supplements/snacks within 2-3 days    Nutrition Status Classification: Moderately Compromised    Discharge Planning and Education: Discharge plan of care for nutrition pending clinical course.    Follow up per nutrition services protocol while inpatient.    Recommendation(s) to Physician:   ?? RD to update Boost Max flavor to chocolate only  ?? Monitor PO intake, wt, labs, and POC    Area RD to provide follow up and make further recommendations/adjustments to nutrition care plan as needed.    Ervin Knack, RDN, LDN  Clinical Dietitian  Contact via Epic messenger

## 2021-04-14 NOTE — Unmapped (Signed)
[place]  Case Management/Social Work Department  Progress Note    Patient Information     Hospital day: 7  Inpatient/Observation:  Inpatient   Level of Care:  Floor status  Admit date:  04/07/2021  Admission diagnosis: HEPATITIS    PMH:  has a past medical history of Acne, Amenorrhea, Autoimmune hepatitis (CMS Dx) (03/2016), Bilateral leg edema, Cirrhosis (CMS Dx), Dysmenorrhea, Esophageal varices (CMS Dx) (05/2016), H/O infertility, and PCOS (polycystic ovarian syndrome).    PCP:  Faylene Million, DO    Home Pharmacy:    [facility] - [place] [employer], [place] - [address]  [address]  [address]  [place] [address]  Phone: [phone]     [facility] [address] [place], [place] - [address] [place] [address]  Phone: [phone]         Medical Insurance Coverage:  Payor: [phone] TRICARE / Plan: [facility] MILITARY TRICARE / Product Type: *No Product type* /     Other Pertinent Information     CM has completed a chart review and completed rounds with the team. Per team, the pt is not medically ready for D/C today. Awaiting liver biopsy at this time. Plan for PICC line today.     Discharge Plan     Anticipated discharge plan:  Home with no needs     Anticipated discharge date:  08/06     CM/SW will continue to follow and remain available for discharge planning needs.      Sol Passer, RN     Cell [phone]

## 2021-04-14 NOTE — Unmapped (Signed)
Patient and husband informed of room transfer to 8E room 8146 and agreeable. Report called to [facility]. All personal belongings with patient. Transport to be arranged per bed.

## 2021-04-14 NOTE — Unmapped (Signed)
Tracy Watkins is a 25 y.o. female patient.  1. Elevated liver enzymes    2. Autoimmune hepatitis (CMS Dx)    3. Abnormal transaminases    4. Hyperstimulation of ovaries    5. Generalized abdominal pain      Past Medical History:   Diagnosis Date   ??? Acne    ??? Amenorrhea    ??? 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 Bilateral leg edema    ??? Cirrhosis (CMS Dx)    ??? Dysmenorrhea    ??? Esophageal varices (CMS Dx) 05/2016    Grade 1/small, no history of bleeding   ??? H/O infertility    ??? PCOS (polycystic ovarian syndrome)      Blood pressure 110/59, pulse 91, temperature 98.2 ??F (36.8 ??C), temperature source Oral, resp. rate 18, weight (!) 248 lb 8 oz (112.7 kg), SpO2 100 %.       INSERT MidLINE    Date/Time: 04/14/2021 9:41 AM  Performed by: Celestia Khat, RN  Authorized by: Mikey Bussing, MD     Universal Protocol:     Verbal consent obtained?: Yes      Written consent obtained?: No      Risks and benefits: Risks, benefits and alternatives were discussed      Consent given by:  Patient    Patient states understanding of procedure being performed: Yes      Patient's understanding of procedure matches consent: Yes      Procedure consent matches procedure scheduled: Yes      Relevant documents present and verified: Yes      Test results available and properly labeled: Yes      Site marked: Yes      Imaging studies available: Yes      Required items: Required blood products, implants, devices and special equipment available      Patient identity confirmed:  Verbally with patient and arm band    Time out: Immediately prior to the procedure a time out was called    A time out verifies correct patient, procedure, equipment, support staff and site/side marked as required:   Preparation:     Preparation: Patient was prepped and draped in usual sterile fashion    Site:      basilic    Local anesthesia used?: Yes      Anesthesia:  Local infiltration    Local anesthetic:  Lidocaine 1% without epinephrine    Anesthetic total (ml):   2    Patient sedated: No    Post-procedure:     Patient tolerance:  Patient tolerated the procedure well with no immediate complications     Patient educated on infection prevention, written materials provided at bedside, OK to use midline          Celestia Khat  04/14/2021

## 2021-04-14 NOTE — Unmapped (Signed)
Bedside report/handoff received from [facility]. Patient awake, alert and oriented x4; appears comfortable with easy and regular respirations. Overbed table and call light within reach.

## 2021-04-14 NOTE — Unmapped (Addendum)
IV KCL started to left upper arm Midline; patient states burning at site and indicates she cannot tolerate the infusion;IV stopped per request. Patient states she takes oral potassium at home. Team paged.

## 2021-04-14 NOTE — Unmapped (Signed)
Pt transferred from cdu to room 8146. Pt a&o X4 ,vitals and assessment completed. Pt questions and concerns were addressed. Pt educated on using call light for help. Will continue to monitor.

## 2021-04-14 NOTE — Unmapped (Signed)
Problem: Acute Pain  Description: Patient's pain progressing toward patient's stated pain goal  Goal: Patient displays improved well-being such as baseline levels for pulse, BP, respirations and relaxed muscle tone or body posture  Outcome: Progressing  Goal: Patient will manage pain with the appropriate technique/intervention  Description: Assess and monitor patient's pain using appropriate pain scale. Collaborate with interdisciplinary team and initiate plan and interventions as ordered.  Re-assess patient's pain level 30-60 minutes after pain management intervention.  Outcome: Progressing  Goal: Patient will reduce or eliminate use of analgesics  Outcome: Progressing  Goal: Patients pain is managed to allow active participation in daily activities  Outcome: Progressing  Goal: Patient verbalizes a reduction in pain level  Outcome: Progressing  Goal: Discharge Pain Management Plan (Acute Pain)  Outcome: Progressing

## 2021-04-14 NOTE — Unmapped (Signed)
Department of Internal Medicine  Daily Progress Note      Chief Complaint / Reason for Follow-Up     Tracy Watkins is a 25 y.o. 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 The principal reason for today's follow up visit is Elevated liver enzymes.    Interval History / Subjective     Tearful this morning after being weighed and realizing the degree of fluid retention she is experiencing. She continues to have intermittent abdominal pain and discomfort in her legs due to the swelling. She is having large urine output following lasix dosing.      Review of Systems (Focused)     Negative for shortness of breath, lightheadedness, chest pain, nausea, vomiting, diarrhea, constipation.       Medications     Scheduled Meds:  ??? cholestyramine-aspartame  4 g Oral BID   ??? furosemide (LASIX) injection  20 mg Intravenous Once   ??? heparin  5,000 Units Subcutaneous Q8H   ??? levothyroxine  112 mcg Oral QAM AC   ??? pantoprazole  40 mg Oral DAILY 0600   ??? PNV,calcium 72-iron-folic acid  1 tablet Oral Daily 0900   ??? potassium chloride (KCl)  10 mEq Intravenous Q1H SCH   ??? predniSONE  60 mg Oral Daily 0900   ??? sodium chloride  10 mL Intravenous QS   ??? sodium chloride  3-30 mL Intracatheter QS   ??? ursodioL  600 mg Oral BID     Continuous Infusions:  PRN Meds:  acetaminophen, albuterol, alteplase (TPA) 1mg /ml intracatheter syringe for line care, dextrose 10% in water **OR** dextrose 10% in water, glucose, ondansetron, oxyCODONE **OR** [DISCONTINUED] oxyCODONE, proMETHazine, sodium chloride 0.9 %, sodium chloride       Vital Signs     Temp:  [97.8 ??F (36.6 ??C)-98.4 ??F  (36.9 ??C)] 98.4 ??F (36.9 ??C)  Heart Rate:  [80-94] 94  Resp:  [16-18] 18  BP: (100-125)/(52-72) 120/72    Intake/Output Summary (Last 24 hours) at 04/14/2021 1422  Last data filed at 04/14/2021 0359  Gross per 24 hour   Intake --   Output 1900 ml   Net -1900 ml         Physical Exam     Physical Exam  Constitutional:       General: She is not in acute distress.     Appearance: She is not ill-appearing.   HENT:      Nose: No congestion or rhinorrhea.      Mouth/Throat:      Mouth: Mucous membranes are moist.      Pharynx: Oropharynx is clear.   Eyes:      General: Scleral icterus present.      Extraocular Movements: Extraocular movements intact.      Conjunctiva/sclera: Conjunctivae normal.   Cardiovascular:      Rate and Rhythm: Normal rate and regular rhythm.      Pulses: Normal pulses.      Heart sounds: No murmur heard.    No friction rub. No gallop.   Pulmonary:      Effort: Pulmonary effort is normal.      Breath sounds: Normal breath sounds.   Abdominal:      General:  Bowel sounds are normal. There is distension.      Palpations: Abdomen is soft.      Tenderness: There is abdominal tenderness (epigastric). There is no guarding or rebound.   Musculoskeletal:      Cervical back: Normal range of motion and neck supple.      Right lower leg: Edema present.      Left lower leg: Edema present.   Skin:     General: Skin is warm and dry.      Capillary Refill: Capillary refill takes less than 2 seconds.   Neurological:      General: No focal deficit present.      Mental Status: She is alert and oriented to person, place, and time.   Psychiatric:      Comments: Tearful         Laboratory Data       CBC 04/14/2021                 \\    9.2   /         1.7  \\______/ 81                 /            \\                             /   26.2    \\ Differential 04/14/2021    N 36.2 L 42.6 M 16.6 E 3.7 B 0.9      Renal 04/14/2021       140       107         14    /    ___ __ _____ ______/ 92                                       \\     3.2          28       0.65   \\  Ca     7.9 (04/14/2021)  Mg     1.9 (04/14/2021)  Phos 2.6 (04/14/2021) Lipids    Lab Results   Component Value Date    CHOLTOT 143 04/16/2020    TRIG 55 04/16/2020    HDL 72 04/16/2020    LDL 59 04/16/2020      LFTs 04/14/2021               \\      9.1      /              \\    5.07    /         109  \\______/  185               /            \\              /    81      \\ PT/INR/PTT - 04/13/2021    PT  20.8 INR  1.7 PTT  No results found for requested labs within last 16109 hours.                    Invalid input(s): WBCCAST, GRANCAST  No results found for: NTPROBNP    Lab Results   Component Value Date    TSH <0.02 (L) 04/09/2021    FREET4 1.56 04/09/2021           Lab 04/13/21  0016 04/10/21  2321 04/10/21  0811   POC GLU MONITORING DEVICE 115* 117* 84       Diagnostic Studies     none      Assessment & Plan     Tracy Watkins is a 25 y.o. female on HD# 7 with Elevated liver enzymes.  The medical issues being addressed in today's encounter are as follows:    Principal Problem:    Elevated liver enzymes      #Acute liver injury  #Cirrhosis d/b Ascites, NBEV, 2/2 AIH  C/f flare of autoimmune hepatitis flare vs DILI vs OHSS, in the setting of recent IVF. Liver function slowly improving on steroids.   ??  MELD-Na score: 21 at 04/14/2021 12:19 PM  Calculated from:  Serum Creatinine: 0.65 mg/dL (Using min of 1 mg/dL) at 09/18/1094 04:54 PM  Serum Sodium: 140 mmol/L (Using max of 137 mmol/L) at 04/14/2021 12:19 PM  Total Bilirubin: 9.1 mg/dL at 0/05/8118 14:78 PM  INR(ratio): 1.7 at 04/13/2021  5:22 AM  Age: 74 years    - Liver and GYN consulted  - Monitoring??am labs  - Oxycodone 5 q4h prn, for abdominal pain  - Phenergan, Zofran, prn for nausea  - Continue Prednisone 60 daily  - Planning for Liver Biopsy, once ANC has recovered  - Holding Azathioprine, in the setting of neutropenia  ??  #Neutropenia, moderate  - WBC 0.9, ANC 613. Likely 2/2 Azathioprine vs splenic sequestration related to cirrhosis.   -  Holding Azathioprine  - F/u thiopurine metabolites  ??  #Hypoglycemia??  Seen on AM labs??07/29. Concerning for decreased hepatic glycogen reserve/ gluconeogenesis function. Repeat POC glucose improved.  - POC glucose qAC/HS   - Hypoglycemia protocol  ??  #Pruritis  Secondary to hyperbilirubinemia. Refractory to benadryl and hydroxyzine  -??Continue??Cholestyramine 4g BID  -??Continue??Ursodiol BID  ??  #Lower extremity edema  Likely secondary to decreased mobility with venous stasis, vasodilation related to recent IVF hormones. Abdominal US on admission negative for thrombosis.  -??Hold??Hydrochlorothiazide 25 daily  -??Lasix 20mg  IV  - Thigh high compression stockings  ??  #Blood Cultures, positive for Micrococcus  Likely contaminant with repeat cultures negative.   - S/p vancomycin 7/27  ??  #PCOS  #Infertility, on IVF  - Prenatal Vitamin daily  ??  #Hypothyroidism  - Levothyroxine daily   ??  #Asthma  - Albuterol inhaler PRN   ??  DVT Prophylaxis:??SubQ Hep    Nutrition:  Diet Orders  Report         Dietary nutrition supplements starting at 08/01 1252    Dietary nutrition supplements starting at 08/01 1251    Diet regular sodium 2 gm (low) starting at 08/01 0853          Code Status: Full Code    Signed:  Mikey Bussing, MD  04/14/2021, 2:22 PM

## 2021-04-15 ENCOUNTER — Inpatient Hospital Stay: Admit: 2021-04-15 | Payer: TRICARE (CHAMPUS)

## 2021-04-15 LAB — RENAL FUNCTION PANEL W/EGFR
Albumin: 2.9 g/dL (ref 3.5–5.7)
Albumin: 2.1 g/dL (ref 3.5–5.7)
Anion Gap: 5 mmol/L (ref 3–16)
Anion Gap: 4 mmol/L (ref 3–16)
BUN: 16 mg/dL (ref 7–25)
BUN: 17 mg/dL (ref 7–25)
CO2: 24 mmol/L (ref 21–33)
CO2: 24 mmol/L (ref 21–33)
Calcium: 8 mg/dL (ref 8.6–10.3)
Calcium: 7.8 mg/dL (ref 8.6–10.3)
Chloride: 106 mmol/L (ref 98–110)
Chloride: 110 mmol/L (ref 98–110)
Creatinine: 0.65 mg/dL (ref 0.60–1.30)
Creatinine: 0.53 mg/dL (ref 0.60–1.30)
EGFR: >90
EGFR: >90
Glucose: 166 mg/dL (ref 70–100)
Glucose: 69 mg/dL (ref 70–100)
Osmolality, Calculated: 285 mOsm/kg (ref 278–305)
Osmolality, Calculated: 286 mOsm/kg (ref 278–305)
Phosphorus: 3.4 mg/dL (ref 2.1–4.7)
Phosphorus: 2.9 mg/dL (ref 2.1–4.7)
Potassium: 7.3 mmol/L (ref 3.5–5.3)
Potassium: 4.1 mmol/L (ref 3.5–5.3)
Sodium: 135 mmol/L (ref 133–146)
Sodium: 138 mmol/L (ref 133–146)

## 2021-04-15 LAB — HEPATIC FUNCTION PANEL
ALT: 163 U/L (ref 7–52)
AST: 89 U/L (ref 13–39)
Albumin: 2.1 g/dL (ref 3.5–5.7)
Alkaline Phosphatase: 71 U/L (ref 36–125)
Bilirubin, Direct: 4.67 mg/dL (ref 0.00–0.40)
Bilirubin, Indirect: 3.93 mg/dL (ref 0.00–1.10)
Total Bilirubin: 8.6 mg/dL (ref 0.0–1.5)
Total Protein: 4.6 g/dL (ref 6.4–8.9)

## 2021-04-15 LAB — CBC
Hematocrit: 25.6 % (ref 35.0–45.0)
Hemoglobin: 9.2 g/dL (ref 11.7–15.5)
MCH: 40.9 pg (ref 27.0–33.0)
MCHC: 35.8 g/dL (ref 32.0–36.0)
MCV: 114 fL (ref 80.0–100.0)
MPV: 7.8 fL (ref 7.5–11.5)
Platelets: 70 10*3/uL (ref 140–400)
RBC: 2.24 10*6/uL (ref 3.80–5.10)
RDW: 25.3 % (ref 11.0–15.0)
WBC: 1.7 10*3/uL (ref 3.8–10.8)

## 2021-04-15 LAB — DIFFERENTIAL
Basophils Absolute: 3 /uL (ref 0–200)
Basophils Relative: 0.2 % (ref 0.0–1.0)
Eosinophils Absolute: 29 /uL (ref 15–500)
Eosinophils Relative: 1.7 % (ref 0.0–8.0)
Lymphocytes Absolute: 496 /uL (ref 850–3900)
Lymphocytes Relative: 29.2 % (ref 15.0–45.0)
Monocytes Absolute: 230 /uL (ref 200–950)
Monocytes Relative: 13.5 % (ref 0.0–12.0)
Neutrophils Absolute: 942 /uL (ref 1500–7800)
Neutrophils Relative: 55.4 % (ref 40.0–80.0)
nRBC: 0 /100 WBC (ref 0–0)

## 2021-04-15 LAB — PROTIME-INR
INR: 2 (ref 0.9–1.1)
Protime: 23.3 seconds (ref 12.1–15.1)

## 2021-04-15 LAB — POC GLU MONITORING DEVICE
POC Glucose Monitoring Device: 189 mg/dL (ref 70–100)
POC Glucose Monitoring Device: 61 mg/dL (ref 70–100)
POC Glucose Monitoring Device: 91 mg/dL (ref 70–100)

## 2021-04-15 LAB — POTASSIUM: Potassium: 4.1 mmol/L (ref 3.5–5.3)

## 2021-04-15 LAB — MAGNESIUM
Magnesium: 2.4 mg/dL (ref 1.5–2.5)
Magnesium: 2 mg/dL (ref 1.5–2.5)

## 2021-04-15 MED ORDER — predniSONE (DELTASONE) 20 MG tablet
20 | ORAL_TABLET | Freq: Every day | ORAL | 0 refills | Status: AC
Start: 2021-04-15 — End: 2021-05-16

## 2021-04-15 MED ORDER — furosemide (LASIX) injection 20 mg
10 | Freq: Once | INTRAMUSCULAR | Status: AC
Start: 2021-04-15 — End: 2021-04-15
  Administered 2021-04-15: 14:00:00 20 mg via INTRAVENOUS

## 2021-04-15 MED ORDER — ursodioL (ACTIGALL) 300 mg capsule
300 | ORAL_CAPSULE | Freq: Two times a day (BID) | ORAL | 0 refills | Status: AC
Start: 2021-04-15 — End: 2021-05-15

## 2021-04-15 MED ORDER — pantoprazole (PROTONIX) 40 MG tablet
40 | ORAL_TABLET | Freq: Every day | ORAL | 0 refills | Status: AC
Start: 2021-04-15 — End: ?

## 2021-04-15 MED ORDER — cholestyramine-aspartame (PREVALITE) 4 gram PwPk packet
4 | PACK | Freq: Two times a day (BID) | ORAL | 0 refills | Status: AC
Start: 2021-04-15 — End: 2021-05-15

## 2021-04-15 MED FILL — PREVALITE 4 GRAM POWDER FOR SUSP IN A PACKET: 4 4 gram | ORAL | Qty: 1

## 2021-04-15 MED FILL — OXYCODONE 5 MG TABLET: 5 5 MG | ORAL | Qty: 1

## 2021-04-15 MED FILL — POTASSIUM CHLORIDE ER 20 MEQ TABLET,EXTENDED RELEASE(PART/CRYST): 20 20 MEQ | ORAL | Qty: 2

## 2021-04-15 MED FILL — FUROSEMIDE 10 MG/ML INJECTION SOLUTION: 10 10 mg/mL | INTRAMUSCULAR | Qty: 4

## 2021-04-15 MED FILL — URSODIOL 300 MG CAPSULE: 300 300 mg | ORAL | Qty: 2

## 2021-04-15 MED FILL — TYLENOL 325 MG TABLET: 325 325 mg | ORAL | Qty: 1

## 2021-04-15 MED FILL — PREDNISONE 20 MG TABLET: 20 20 MG | ORAL | Qty: 3

## 2021-04-15 NOTE — Unmapped (Deleted)
Department of Internal Medicine  Daily Progress Note      Chief Complaint / Reason for Follow-up     Tracy Watkins is a 25 y.o. female on hospital day 8. The principal reason for today's follow up visit is Elevated liver enzymes.    Interval History / Subjective     NAEO. Pt tearful and stating she wants to go home. With INR uptrending and only modest improvements in liver labs, discussed with pt that it is still unsafe to leave at this time without a plan for follow-up.     Pt with critical K of 7.3 on pm labs, prompting EKG and repeat labs. EKG was normal and K 4.1 on repeat.     ROS:   General: No fever, chills  Eyes: No vision changes  Cardiovascular: No chest pain, palpitations  Lungs: No SOB, cough  GI: No n/v, constipation, diarrhea; + abdominal pain, but improved from prior days  MSK: No muscle/joint pain; + BLE edema  GU: No dysuria; no changes in frequency  Neuro: No dizziness, lightheadedness, LOC  Derm: No rashes, lesions, itching        Physical Exam     Physical Exam  Vitals reviewed.   Constitutional:       General: She is not in acute distress.     Appearance: She is obese. She is not ill-appearing.   Neurological:      Mental Status: She is alert.   Psychiatric:         Attention and Perception: Attention normal.         Mood and Affect: Mood is anxious. Affect is tearful.      Comments: Considerably more anxious than prior days; pt crying and tremulous when discussing discharge plans          Diagnostic Data     All laboratory data was reviewed and relevant values are noted as below.     Latest Reference Range & Units 04/11/21 17:55 04/12/21 05:32 04/13/21 05:22 04/14/21 12:19 04/15/21 05:52   Alkaline Phosphatase 36 - 125 U/L 77 67 66 81 71   AST (SGOT) 13 - 39 U/L 218 (H) 200 (H) 159 (H) 109 (H) 89 (H)   ALT (SGPT) 7 - 52 U/L 262 (H) 233 (H) 210 (H) 185 (H) 163 (H)   Albumin 3.5 - 5.7 g/dL 2.7 (L) 2.6 (L) 2.4 (L) 2.2 (L) 2.1 (L)   Protein, Total 6.4 - 8.9 g/dL 5.4 (L) 5.2 (L) 5.1 (L) 4.8 (L)  4.6 (L)   Bilirubin, Indirect 0.00 - 1.10 mg/dL 4.54 (H) 0.98 (H) 1.19 (H) 4.03 (H) 3.93 (H)   Bili, Total 0.0 - 1.5 mg/dL 14.7 (H) 9.9 (H) 82.9 (H) 9.1 (H) 8.6 (H)   Bilirubin, Direct 0.00 - 0.40 mg/dL 5.62 (H) 1.30 (H) 8.65 (H) 5.07 (H) 4.67 (H)   (H): Data is abnormally high  (L): Data is abnormally low     Latest Reference Range & Units 04/12/21 07:56 04/13/21 05:22 04/14/21 12:19 04/15/21 05:52   WBC 3.8 - 10.8 10E3/uL 1.8 (L) 0.9 (LL) 1.7 (L) 1.7 (L)   RBC 3.80 - 5.10 10E6/uL 2.61 (L) 2.37 (L) 2.28 (L) 2.24 (L)   Hemoglobin 11.7 - 15.5 g/dL 78.4 (L) 9.7 (L) 9.2 (L) 9.2 (L)   Hematocrit 35.0 - 45.0 % 29.7 (L) 26.6 (L) 26.2 (L) 25.6 (L)   MCV 80.0 - 100.0 fL 113.9 (H) 112.3 (H) 115.1 (H) 114.0 (H)   MCH 27.0 - 33.0 pg 40.1 (  H) 40.9 (H) 40.3 (H) 40.9 (H)   MCHC 32.0 - 36.0 g/dL 16.1 09.6 (H) 04.5 40.9   RDW 11.0 - 15.0 % 24.6 (H) 25.1 (H) 25.4 (H) 25.3 (H)   Platelet Count 140 - 400 10E3/uL 95 (L) 127 (L) 81 (L) 70 (L)   (L): Data is abnormally low  (H): Data is abnormally high  (LL): Data is critically low     Latest Reference Range & Units 04/09/21 05:53 04/12/21 07:56 04/13/21 05:22 04/14/21 12:19 04/15/21 05:52   Neutrophils Absolute 1,500 - 7,800 /uL 1,488 (L) 923 (L) 613 (L) 615 (L) 942 (L)   (L): Data is abnormally low    Imaging:   [place] LUE (08/03):  1. The PICC line is in the basilic vein. No evidence of hematoma or fluid collection at insertion.   2. Thrombosis of the medial superficial vein that may be the cephalic vein but is only partially imaged. Vascular ultrasound of the left upper extremity is recommended for further evaluation.     Assessment & Plan     Principal Problem:    Elevated liver enzymes        Tracy Watkins is a 25 y.o. female on HD# 8 with Elevated liver enzymes.  The medical issues being addressed in today's encounter are as follows:      #Acute liver injury  #Cirrhosis d/b Ascites, NBEV, 2/2 AIH  C/f flare of autoimmune hepatitis flare vs??DILI vs OHSS, in the setting of??recent IVF.  Liver function slowly improving on steroids.   ??  MELD-Na score: 21 at 04/14/2021 12:19 PM  Calculated from:  Serum Creatinine: 0.65 mg/dL (Using min of 1 mg/dL) at 04/13/1913 78:29 PM  Serum Sodium: 140 mmol/L (Using max of 137 mmol/L) at 04/14/2021 12:19 PM  Total Bilirubin: 9.1 mg/dL at 01/16/2129 86:57 PM  INR(ratio): 1.7 at 04/13/2021  5:22 AM  Age: 77 years  ??  - Liver and GYN consulted  - Monitoring??am labs  - Oxycodone 5 q4h prn, for abdominal pain  - Phenergan, Zofran, prn for nausea  - Continue Prednisone 60 daily  - Planning for Liver Biopsy, once ANC has recovered  - Holding Azathioprine, in the setting of neutropenia  ??  #Neutropenia, moderate  - WBC 0.9, ANC 613. Likely 2/2 Azathioprine vs splenic sequestration related to cirrhosis.   - Holding Azathioprine  - F/u thiopurine metabolites  ??  #Hypoglycemia??  Seen on AM labs??07/29. Concerning for decreased hepatic glycogen reserve/ gluconeogenesis function. Repeat POC glucose improved.  - POC glucose qAC/HS   - Hypoglycemia protocol  ??  #Pruritis  Secondary to hyperbilirubinemia. Refractory to benadryl and hydroxyzine  -??Continue??Cholestyramine 4g BID  -??Continue??Ursodiol BID  ??  #Lower extremity edema  Likely secondary to decreased mobility with venous stasis, vasodilation related to recent IVF hormones. Abdominal US on admission negative for thrombosis.  -??Hold??Hydrochlorothiazide 25 daily  -??Lasix 20mg  IV  - Thigh high compression stockings  ??  #Blood Cultures, positive for Micrococcus  Likely contaminant with repeat cultures negative.   - S/p vancomycin 7/27  ??  #PCOS  #Infertility, on IVF  - Prenatal Vitamin daily  ??  #Hypothyroidism  - Levothyroxine daily   ??  #Asthma  - Albuterol inhaler PRN     Code Status: FULL  DVT Prophylaxis: SubQ Hep  Nutrition: Low Na  Disposition Plan: Awaiting liver plan. GI Floor for now with possible dc home soon      Arvid Right, MD  PGY1 Internal Medicine  Pager: [phone]  04/15/2021, 6:34 AM

## 2021-04-15 NOTE — Unmapped (Addendum)
Tracy Watkins,  Here are your hospital discharge instructions:    --> You were hospitalized for acute liver injury, following your last cycle of IVF. Your liver numbers were significantly elevated and your discharging MELD score was 22. The biggest contributor to that high MELD score at this time in your INR, which reflects poor production of important proteins that help protect you from major bleeding. With this high score and your low platelets, the risk for serious bleeding is high. Please seek medical attention if you have any concern for acute blood loss, including lightheadedness/dizziness, low blood pressure, blood in your stool, or episodes of vomiting blood.    While you were here, we did several imaging studies that did not reveal any acute concerns. However, there were signs of enlarged spleen and cirrhosis with portal hypertension.     It will be very important to continue following with the liver team. They will arrange an outpatient appointment for you; see that info below. Do your best to make it to that appointment!       --> Medication changes:   1) Stop taking Azathioprine (Imuran)     --> New medications:   1) Prednisone 60 daily, continue taking this until your follow-up appointment with liver    Thank you, GI Team    Future Appointments   Date Time Provider Department Center   07/29/2021  3:45 PM Tillman Abide, MD [facility]       Recent Lab Values for you and your doctors:  No results for input(s): TROPONINI, CKMB, BNP in the last 72 hours.  Recent Labs     04/13/21  0522 04/14/21  1219 04/14/21  2056 04/14/21  2302 04/15/21  0552   NA 137 140 135  --  138   K 4.6 3.2* 7.3* 4.1 4.1   CL 109 107 106  --  110   CO2 25 28 24   --  24   BUN 13 14 16   --  17   CREATININE 0.61 0.65 0.65  --  0.53*   GLUCOSE 95 92 166*  --  69*   CALCIUM 7.6* 7.9* 8.0*  --  7.8*   MG 2.0 1.9 2.4  --  2.0   PHOS 3.4 2.6 3.4  --  2.9     Recent Labs     04/13/21  0522 04/14/21  1219 04/15/21  0552   WBC 0.9* 1.7*  1.7*   HGB 9.7* 9.2* 9.2*   HCT 26.6* 26.2* 25.6*   PLT 127* 81* 70*   INR 1.7*  --  2.0*   PROTIME 20.8*  --  23.3*     Recent Labs     04/13/21  0522 04/14/21  1219 04/15/21  0552   AST 159* 109* 89*   ALT 210* 185* 163*   ALKPHOS 66 81 71   BILITOT 10.0* 9.1* 8.6*   BILIDIRECT 4.23* 5.07* 4.67*     No results for input(s): CHOLTOT, TRIG, HDL, CHOLHDL, LDL in the last 72 hours.    Invalid input(s): VLDCHOL  No results for input(s): VITAMINB1, FOLATE in the last 72 hours.

## 2021-04-15 NOTE — Unmapped (Signed)
HEPATOLOGY PROGRESS NOTE    ID: Tracy Watkins is a 25 y.o. 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 States that symptoms of dizziness, jaundice started about 1.5 - 2 weeks ago, shortly after the start of starting injection as part of an In vitro fertilization protocol. On arrival liver enzymes were notable for AlkP of 106, AST/ALT of 623/463 and tbili 13.8, having all been WNL on 12/16/20. Started on Prednisone 40mg  on 7/26.    Interim:  -Anxious to leave hospital.  -Symptoms continue to improve.    ROS:  10 pt ROS negative unless otherwise noted above    Vitals:  Temp:  [97.8 ??F (36.6 ??C)-98.5 ??F (36.9 ??C)] 98.3 ??F (36.8 ??C)  Heart Rate:  [82-119] 92  Resp:  [16-18] 18  BP: (98-128)/(45-82) 123/54  No intake or output data in the 24 hours ending 04/15/21 1241    Physical Exam   Gen: Well-developed, well nourished. No acute distress.   HEENT: NC/AT, EOMI, moist mucus membranes  CV: RRR, no m/r/g, no LE edema  Lungs: Clear to auscultation bilaterally.   Abdomen: +BS, soft, mild tenderness worse in RUQ  Extremities: warm, well perfused. Bilateral pitting edema of legs  Neuro: A&Ox3, moving all extremities spontaneous, no asterixis   Skin: no bruising/rashes/jaundice  Psych: normal affect    MELD-Na score: 22 at 04/15/2021  5:52 AM  Calculated from:  Serum Creatinine: 0.53 mg/dL (Using min of 1 mg/dL) at 09/16/863  7:84 AM  Serum Sodium: 138 mmol/L (Using max of 137 mmol/L) at 04/15/2021  5:52 AM  Total Bilirubin: 8.6 mg/dL at 02/19/6294  2:84 AM  INR(ratio): 2.0 at 04/15/2021  5:52 AM  Age: 8 years        Assessment/Plan:   Tracy Watkins is a 25 y.o. 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  ??  ??  #Elevated liver enzymes, history of AIH: significant, acute elevation in liver enzymes coincides with recent IVF treatment about 2 weeks  ago. While flare of autoimmune hepatitis is high on the differential, it is also possible that elevations are related to Ovarian hyperstimulation syndrome with a more severe presentation given her underlying cirrhosis. Gyn has less concern for this. Infectious etiology also need to be ruled out however are lower on the differential  ??  Recommendations:  -Continue Prednisone 60mg  daily on discharge. Will need to be on this until follow up with Dr. Boykin Nearing  -Can continue ursodiol and cholestyramine for itching  -Continue to hold Imuran pending normal TPMT results.  -Needs weekly labs each Monday to follow LFTs and INR.  -Okay for discharge from hepatology standpoint    Samson Frederic, MD  Gastroenterology/Hepatology Fellow- PGY 6  Pager [phone]

## 2021-04-15 NOTE — Unmapped (Signed)
[place]    Case Manager/Social Worker Discharge Summary     Patient name: Tracy Watkins                                        Patient MRN: [id number]  DOB: [birth year]                              Age: 25 y.o.              Gender: female  Patient emergency contact: Extended Emergency Contact Information  Primary Emergency Contact: Fleeger,Zachary   [place]  Mobile Phone: [phone]  Relation: Spouse      Attending provider: Georgeann Oppenheim, MD  Primary care physician: Faylene Million, DO    The MD has indicated that the patient is ready for discharge.  Patient's discharge needs were assessed and no additional needs were identified. Patient likely to discharge back to prior living arrangements with no additional services. The patient will be transported by family at time of d/c.     Transfer Mode/Level of Care: Family    The plan has been reviewed:     Patient/Family Informed of Discharge Plan: Yes    Plan Reviewed With Patient, Family, or Significant Other: Yes    Patient and or family are aware and in agreement with the discharge plan: Yes             Plan reviewed with MD and other members of the health care team: Yes  Care Plan Completed: Yes      This plan has been reviewed with the multi-disciplinary team.     Treatment Preferences    Post-Discharge Goals    Post Acute Care Provider Information:    Community Services at Discharge  Community Services at Home post discharge: Not Applicable     No further CM/SW needs.    Rosine Door  RN Case Manager  [phone]

## 2021-04-15 NOTE — Unmapped (Signed)
Nursing Overnight Progress Note    Significant Events During Shift  Pt up ad lib to BR with steady gait.  C/p's of pain in LUE stating it's due to her midline.  No swelling or redness.  Flushes but states can feel the flush.  Pulses WNL.  Medicated with tylenol.  Resident Tresa Endo who's covering for GI team aware.     Patient/Family Concerns  Evening/Overnight Visitors: none  Concerns: none    Assessment  Nursing time demands: moderate  IV access: has IV access, adequate and functioning  Sitter requirements:  no    Mental Status  Mental Status for the past 14 hrs:   Level of Consciousness Orientation Level Cognition   04/14/21 1700 Alert Oriented X4 --   04/14/21 2044 Alert Oriented X4 Ability to abstract       Calls to Physician Team  No data found.    Medications  [phone] - Medications Not Given  (last 12 hrs)         ** SITE UNKNOWN **       Medication Name Action Time Action Reason Comments     sodium chloride flush 3-30 mL 04/14/21 1237 Not Given Other                 Diet/Meals Consumed (past 24 hours)  No data found.

## 2021-04-15 NOTE — Unmapped (Signed)
[facility]  Inpatient Discharge Summary    Patient: Tracy Watkins   MRN: [id number]   CSN: [id number]    Date of Admission: 04/07/2021  Date of Discharge: 04/15/2021   Attending Physician: Georgeann Oppenheim, MD     Reason for Admission     Melenie Minniear is a 25 y.o. 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     Hospital Course By Problem     ??  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 Prior to this admission, she had been treated with Imuran. At one point in the time, she was also taking Cellcept; however, cellcept was discontinued d/t stable liver disease and pt desire to try IVF. Of note, pt was on the [facility] liver waitlist but removed May 2021 d/t stable liver disease. No any acute flares since 2017.   - Throughout hospitalization, pt's hepatic panel showed acute liver injury. On arrival AST 623, ALT 463. Synthetic function significantly altered with INR peaking at 2.0 just prior to discharge. Although there was some initial improvement in liver panel, labs with ultimate plateau. Still considerable concern for pt's liver function, and pt will need very close follow-up to monitor labs. Hepatology with   - Due to pancytopenia with neutropenia (ANC ~650), pt should not continue on Imuran.   - Continue Prednisone 60 daily on discharge, until follow-up with Liver.  ??  #Pruritis, 2/2 AIH  - Pt with some improvement on ursodiol, cholestyramine. Benadryl, Atarax were unsuccessful in providing relief.  ??  #Blood Cultures, positive for G+ Cocci  - Initially started on Vanc and Cefepime d/t positive  BCx 07/27. Received Abx x1d. Very likely contamination with no growth in repeat BCx. Pt has remained afebrile, with stable vitals and no signs of acute infection.      #PCOS  #Infertility, on IVF  - Continue home Prenatal Vitamin daily   ??  #Hypothyroidism  - Continue home Levothyroxine daily   ??  #Asthma  - Continue home Albuterol inhaler PRN   ??      Diagnoses Present on Admission     Past Medical History:   Diagnosis Date   ??? Acne    ??? Amenorrhea    ??? 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 Bilateral leg edema    ??? Cirrhosis (CMS Dx)    ??? Dysmenorrhea    ??? Esophageal varices (CMS Dx) 05/2016    Grade 1/small, no history of bleeding   ??? H/O infertility    ??? PCOS (polycystic ovarian syndrome)         Discharge Diagnoses     Active Hospital Problems    Diagnosis Date Noted   ??? Elevated liver enzymes [R74.8] 04/08/2021     Class: Acute      Resolved Hospital Problems   No resolved problems to display.       Operations/Procedures Performed (include dates)     Surgeries:  Surgical/Procedural Cases on this Admission     Case IDs Date Procedure Surgeon Location Status    [id number] 04/13/21 PROCEDURE NOT PERFORMED Rennie Natter, MD [facility]  Lines/Drains/Airways:  Patient Lines/Drains/Airways Status     Active Line / PIV Line     Name Placement date Placement time Site Days    Midline Catheter Single Lumen 04/14/21 Left Basilic 04/14/21  0942  -- 1                Notable Imaging Studies:  XR Chest (07/26)  No acute cardiopulmonary abnormality.     US Transvaginal, Abd/Pelvis, with Doppler (07/26)  - Bilateral enlarged ovaries with thecal lutein cysts consistent with the history of ovarian stimulation. Foci on the left most likely represent hemorrhagic cysts.   ABDOMINAL ULTRASOUND   - Cirrhotic morphology of the liver.   - Splenomegaly.   ABDOMINAL DOPPLER   - Patent hepatic vasculature with antegrade flow.   - The previously seen recanalized periumbilical vein is partially visualized.     CT A/P with  Contrast (07/30)  1. ??No acute abnormality in the abdomen or pelvis.   2. ??Cirrhosis and findings of portal hypertension.     KUB (08/02)  Low lung volume exam with minimal bibasilar atelectasis     [place] LUE (08/03)  1. The PICC line is in the basilic vein. No evidence of hematoma or fluid collection at insertion.   2. Thrombosis of the medial superficial vein that may be the cephalic vein but is only partially imaged. Vascular ultrasound of the left upper extremity is recommended for further evaluation.     Other Procedures:  1. Midline placed on 08/02, d/t difficulty maintaining PIV access    Consulting Services (include reason)     1. Hepatology for management of Cirrhosis 2/2 AIH    2. 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   Quantity: 60 packet  Refills: 0     pantoprazole 40 MG tablet  Commonly known as: PROTONIX  Take 1 tablet (40 mg total) by mouth daily.   Quantity: 30 tablet  Refills: 0     predniSONE 20 MG tablet  Commonly known as: DELTASONE  Take 3 tablets (60 mg total) by mouth daily for 30 days. Indications: autoimmune hepatitis   Quantity: 90 tablet  For: autoimmune hepatitis  Refills: 0     ursodioL 300 mg capsule  Commonly known as: ACTIGALL  Take 2 capsules (600 mg total) by mouth 2 times a day for 30 days.   Quantity: 120 capsule  Refills: 0        TAKE these medications, which you were ALREADY TAKING      Quantity/Refills   [employer]ce [employer] administrator  Generic drug: inhalational spacing device  Inhale 1 each into the lungs if needed.   Refills: 0     bromocriptine 2.5 mg tablet  Commonly known as:  PARLODEL  Take 2.5 mg by mouth daily.   Refills: 0     ergocalciferol 1,250 mcg (50,000 unit) capsule  Commonly known as: ERGOCALCIFEROL  Take 50,000 Units by mouth once a week. Patient taking twice a week   Refills: 0  hydroCHLOROthiazide 25 MG tablet  Commonly known as: HYDRODIURIL  Take 25 mg by mouth daily.   Refills: 0     levothyroxine 112 MCG tablet  Commonly known as: SYNTHROID  Take 112 mcg by mouth every morning before breakfast.   Refills: 0     metFORMIN 500 MG 24 hr tablet  Commonly known as: GLUCOPHAGE-XR  2 TAB PO BID WITH MEALS Indications: polycystic ovarian syndrome   Quantity: 120 tablet  For: polycystic ovarian syndrome  Refills: 4     metoclopramide HCl 10 MG tablet  Commonly known as: REGLAN  Take 10 mg by mouth if needed.   Refills: 0     ondansetron 4 MG disintegrating tablet  Commonly known as: ZOFRAN-ODT  Take 4 mg by mouth every 8 hours as needed.   Refills: 0     PRENATAL VITAMIN 1+1 ORAL  Take by mouth.   Refills: 0     ProAir HFA 90 mcg/actuation inhaler  Generic drug: albuterol  Inhale 2 puffs into the lungs every 4 hours as needed.   Refills: 0        STOP taking these medications    azaTHIOprine 50 mg tablet  Commonly known as: IMURAN           Where to Get Your Medications      These medications were sent to [facility] [employer], [place] - [address] [address]  [address], [place] [address]    Phone: [phone]   ?? cholestyramine-aspartame 4 gram Pwpk packet  ?? pantoprazole 40 MG tablet  ?? predniSONE 20 MG tablet  ?? ursodioL 300 mg capsule         Condition on Discharge     1. Functional Status: Normal    2. Mental Status: Normal    3. Diet / Tube Feeding / TPN:  Diet Orders  Report         Diet NPO past midnight starting at 08/03 2359    Dietary nutrition supplements starting at 08/02 1429    Dietary nutrition supplements starting at 08/01 1251    Diet regular sodium 2 gm (low) starting at 08/01 0853        As listed  above    4. Respiratory / Lines & Tubes / Wounds:  None required    5. Discharge Physical Exam:    Temp:  [97.8 ??F (36.6 ??C)-98.5 ??F (36.9 ??C)] 98.3 ??F (36.8 ??C)  Heart Rate:  [82-119] 92  Resp:  [16-18] 18  BP: (98-128)/(45-82) 123/54  ??  Physical Exam  Vitals reviewed.   Constitutional:       General: She is not in acute distress.     Appearance: She is not ill-appearing.   HENT:      Nose: No congestion or rhinorrhea.      Mouth/Throat:      Mouth: Mucous membranes are moist.      Pharynx: Oropharynx is clear.   Eyes:      General: Scleral icterus present.      Extraocular Movements: Extraocular movements intact.      Conjunctiva/sclera: Conjunctivae normal.   Cardiovascular:      Rate and Rhythm: Normal rate and regular rhythm.      Pulses: Normal pulses.      Heart sounds: No murmur heard.    No friction rub. No gallop.   Pulmonary:      Effort: Pulmonary effort is normal.  Breath sounds: Normal breath sounds.   Abdominal:      General: Bowel sounds are normal. There is distension.      Palpations: Abdomen is soft.      Tenderness: There is abdominal tenderness (epigastric). There is no guarding or rebound.   Musculoskeletal:      Cervical back: Normal range of motion and neck supple.      Right lower leg: Edema present.      Left lower leg: Edema present.   Skin:     General: Skin is warm and dry.      Capillary Refill: Capillary refill takes less than 2 seconds.   Neurological:      General: No focal deficit present.      Mental Status: She is alert and oriented to person, place, and time.   Psychiatric:         Attention and Perception: Attention normal.         Mood and Affect: Mood is anxious. Affect is tearful.      Comments: Considerably more anxious than prior days; pt crying and tremulous when discussing discharge plans     No intake or output data in the 24 hours ending 04/15/21 1315      Core Measure Documentation (As Applicable)     Most Recent Wt: Weight: (!) 247 lb 9.2 oz (112.3 kg)               Disposition     Home independent    Follow-Up Appointments and Items     Future Appointments   Date Time Provider Department Center   07/29/2021  3:45 PM Tillman Abide, MD [facility]       No follow-up provider specified.         Arvid Right, MD  PGY1 Internal Medicine  Pager: [phone]  04/15/2021, 1:15 PM

## 2021-04-15 NOTE — Unmapped (Signed)
Problem: Acute Pain  Description: Patient's pain progressing toward patient's stated pain goal  Goal: Patient displays improved well-being such as baseline levels for pulse, BP, respirations and relaxed muscle tone or body posture  Outcome: Progressing  Goal: Patient will manage pain with the appropriate technique/intervention  Description: Assess and monitor patient's pain using appropriate pain scale. Collaborate with interdisciplinary team and initiate plan and interventions as ordered.  Re-assess patient's pain level 30-60 minutes after pain management intervention.  Outcome: Progressing  Goal: Patient will reduce or eliminate use of analgesics  Outcome: Progressing  Goal: Patients pain is managed to allow active participation in daily activities  Outcome: Progressing  Goal: Patient verbalizes a reduction in pain level  Outcome: Progressing  Goal: Discharge Pain Management Plan (Acute Pain)  Outcome: Progressing

## 2021-04-17 NOTE — Unmapped (Signed)
RN called and scheduled for 05/06/21 @ 2:15 pm ( 2 pm arrival) @ the [facility] location. Pt confirmed this appt.

## 2021-04-17 NOTE — Unmapped (Signed)
[facility] and told her to f/y with Dr. Boykin Nearing 2-3 weeks. She already has apt but its out until November and there is nothing sooner. Pt would like a call back please

## 2021-04-18 NOTE — Unmapped (Signed)
Told patient to resume diuretic and contact Dr. Trinna Post office Monday morning.  If difficulty breathing go to ER.

## 2021-04-18 NOTE — Unmapped (Signed)
MRN: [id number]   Specialty: [facility] GASTR[facility]    Patient Name: Tracy Watkins     Patient Date of Birth: 1996/08/24     Relationship of Caller to Patient: Gareth Eagle [place]; [phone]    Patient of: K. BARI MD    Ashby Dawes of Call: PT  HUSBAND CALLED REGARDING PT SWELLING. PT WAS DISCHARGED ON 04/07/21 AND STATES WATER PILL IS NOT WORKING.    On Call Provider Contacted: J. LOUGHREY MD     Provider contacted via pager or cell VIA CELL    Time and Method: 8:26 AM VIA CELL    Caller advised to call back in 30 minutes, if they do not hear from the provider on-call.  Note was routed to the receiving on-call provider/pool.

## 2021-04-18 NOTE — Unmapped (Signed)
MRN: [id number]   Specialty: Laure[facility] Schim[facility]    Patient Name: Tracy Watkins     Patient Date of Birth: October 31, 1995     Relationship of Caller to Patient: SALLEE HOGREFE CALLBACK NUMBER [phone]    Patient of: DR Gracy Racer of Call: SWELLING LEGS HIPS STATES PAIN DISCOMFORT FROM SWELLING IS AFFECTING HER BREATHING    On Call Provider Contacted: DR [facility]     Provider contacted via pager or cell CELL 8:02 AM    Time and Method: CONNECTED DR AND PATIENT TOGETHER    Caller advised to call back in 30 minutes, if they do not hear from the provider on-call.  Note was routed to the receiving on-call provider/pool.

## 2021-04-19 NOTE — Unmapped (Signed)
Transfer Note    Patient presenting with worsening shortness of breath, bilateral pleural effusions and worsening biliary labs.   Tachycardic, and encephalopathic.  She has got normal saline and albumin and 40 mg of lasix.  Total bili is 8.3.  Na of 130.  AST of 139, ALT of 190.  INR of 1.7.  She is undergoing IVF with concerns of ovarian hyperstimulation syndrome. Slowed mentation, no acute confusion.  No asterixis.  Unbalanced.  Getting lactulose and rifaximin.  Did not have a tap able pocket on bedside US.

## 2021-04-20 ENCOUNTER — Inpatient Hospital Stay: Admit: 2021-04-20 | Payer: TRICARE (CHAMPUS)

## 2021-04-20 ENCOUNTER — Inpatient Hospital Stay
Admission: EM | Admit: 2021-04-20 | Discharge: 2021-04-23 | Disposition: A | Discharge status: Home / Self Care | Payer: TRICARE (CHAMPUS) | Attending: Gastroenterology | Admitting: Gastroenterology

## 2021-04-20 DIAGNOSIS — K754 Autoimmune hepatitis: Secondary | ICD-10-CM

## 2021-04-20 LAB — DIFFERENTIAL
Basophils Absolute: 35 /uL (ref 0–200)
Basophils Relative: 1.6 % (ref 0.0–1.0)
Eosinophils Absolute: 46 /uL (ref 15–500)
Eosinophils Relative: 2.1 % (ref 0.0–8.0)
Lymphocytes Absolute: 510 /uL (ref 850–3900)
Lymphocytes Relative: 23.2 % (ref 15.0–45.0)
Monocytes Absolute: 216 /uL (ref 200–950)
Monocytes Relative: 9.8 % (ref 0.0–12.0)
Neutrophils Absolute: 1393 /uL (ref 1500–7800)
Neutrophils Relative: 63.3 % (ref 40.0–80.0)
PLT Morphology: NORMAL
nRBC: 0 /100{WBCs} (ref 0–0)

## 2021-04-20 LAB — CBC
Hematocrit: 28.1 % (ref 35.0–45.0)
Hemoglobin: 9.8 g/dL (ref 11.7–15.5)
MCH: 40.6 pg (ref 27.0–33.0)
MCHC: 35 g/dL (ref 32.0–36.0)
MCV: 115.9 fL (ref 80.0–100.0)
MPV: 7.4 fL (ref 7.5–11.5)
Platelet Estimate: DECREASED
Platelets: 73 10*3/uL (ref 140–400)
RBC: 2.42 10*6/uL (ref 3.80–5.10)
RDW: 24 % (ref 11.0–15.0)
WBC: 2.2 10*3/uL (ref 3.8–10.8)

## 2021-04-20 LAB — HEPATIC FUNCTION PANEL
ALT: 153 U/L (ref 7–52)
AST: 103 U/L — ABNORMAL HIGH (ref 13–39)
Albumin: 2.7 g/dL — ABNORMAL LOW (ref 3.5–5.7)
Alkaline Phosphatase: 83 U/L (ref 36–125)
Bilirubin, Direct: 5.02 mg/dL (ref 0.00–0.40)
Bilirubin, Indirect: 5.38 mg/dL (ref 0.00–1.10)
Total Bilirubin: 10.4 mg/dL (ref 0.0–1.5)
Total Protein: 5.5 g/dL (ref 6.4–8.9)

## 2021-04-20 LAB — PROTIME-INR
INR: 1.9 — ABNORMAL HIGH (ref 0.9–1.1)
Protime: 22.2 seconds (ref 12.1–15.1)

## 2021-04-20 LAB — RENAL FUNCTION PANEL W/EGFR
Albumin: 2.7 g/dL — ABNORMAL LOW (ref 3.5–5.7)
Albumin: 2.8 g/dL (ref 3.5–5.7)
Anion Gap: 10 mmol/L (ref 3–16)
Anion Gap: 7 mmol/L (ref 3–16)
BUN: 16 mg/dL (ref 7–25)
BUN: 18 mg/dL (ref 7–25)
CO2: 23 mmol/L (ref 21–33)
CO2: 22 mmol/L (ref 21–33)
Calcium: 8.5 mg/dL (ref 8.6–10.3)
Calcium: 8.5 mg/dL (ref 8.6–10.3)
Chloride: 103 mmol/L (ref 98–110)
Chloride: 105 mmol/L (ref 98–110)
Creatinine: 0.59 mg/dL (ref 0.60–1.30)
Creatinine: 0.66 mg/dL (ref 0.60–1.30)
EGFR: >90
EGFR: >90
Glucose: 92 mg/dL (ref 70–100)
Glucose: 147 mg/dL (ref 70–100)
Osmolality, Calculated: 283 mosm/kg (ref 278–305)
Osmolality, Calculated: 283 mOsm/kg (ref 278–305)
Phosphorus: 3.3 mg/dL (ref 2.1–4.7)
Phosphorus: 2.5 mg/dL (ref 2.1–4.7)
Potassium: 3.1 mmol/L (ref 3.5–5.3)
Potassium: 4.9 mmol/L (ref 3.5–5.3)
Sodium: 136 mmol/L (ref 133–146)
Sodium: 134 mmol/L (ref 133–146)

## 2021-04-20 LAB — POC GLU MONITORING DEVICE
POC Glucose Monitoring Device: 84 mg/dL (ref 70–100)
POC Glucose Monitoring Device: 164 mg/dL (ref 70–100)

## 2021-04-20 MED ORDER — rifAXIMin (XIFAXAN) tablet 550 mg
550 | Freq: Two times a day (BID) | ORAL | Status: AC
Start: 2021-04-20 — End: 2021-04-23
  Administered 2021-04-20 – 2021-04-23 (×7): 550 mg via ORAL

## 2021-04-20 MED ORDER — potassium chloride (KLOR-CON M20) CR tablet 40 mEq
20 | Freq: Once | ORAL | Status: AC
Start: 2021-04-20 — End: 2021-04-20
  Administered 2021-04-20: 14:00:00 40 meq via ORAL

## 2021-04-20 MED ORDER — ursodioL (ACTIGALL) capsule 600 mg
300 | Freq: Two times a day (BID) | ORAL | Status: AC
Start: 2021-04-20 — End: 2021-04-23
  Administered 2021-04-20 – 2021-04-23 (×7): 600 mg via ORAL

## 2021-04-20 MED ORDER — albuterol (PROVENTIL) nebulizer solution 2.5 mg
2.5 | Freq: Four times a day (QID) | RESPIRATORY_TRACT | Status: AC | PRN
Start: 2021-04-20 — End: 2021-04-23

## 2021-04-20 MED ORDER — potassium chloride (KLOR-CON M20) CR tablet 40 mEq
20 | Freq: Once | ORAL | Status: AC
Start: 2021-04-20 — End: 2021-04-20
  Administered 2021-04-20: 10:00:00 40 meq via ORAL

## 2021-04-20 MED ORDER — lactulose (CEPHULAC) packet 20 g
20 | Freq: Three times a day (TID) | ORAL | Status: AC
Start: 2021-04-20 — End: 2021-04-21
  Administered 2021-04-20: 14:00:00 20 g via ORAL

## 2021-04-20 MED ORDER — pantoprazole (PROTONIX) EC tablet 40 mg
40 | Freq: Every day | ORAL | Status: AC
Start: 2021-04-20 — End: 2021-04-23
  Administered 2021-04-20 – 2021-04-23 (×4): 40 mg via ORAL

## 2021-04-20 MED ORDER — predniSONE (DELTASONE) tablet 60 mg
20 | Freq: Every day | ORAL | Status: AC
Start: 2021-04-20 — End: 2021-04-23
  Administered 2021-04-20 – 2021-04-23 (×4): 60 mg via ORAL

## 2021-04-20 MED ORDER — levothyroxine (SYNTHROID) tablet 112 mcg
112 | Freq: Every morning | ORAL | Status: AC
Start: 2021-04-20 — End: 2021-04-23
  Administered 2021-04-20 – 2021-04-23 (×4): 112 ug via ORAL

## 2021-04-20 MED ORDER — heparin (porcine) injection 5,000 Units
5000 | Freq: Three times a day (TID) | INTRAMUSCULAR | Status: AC
Start: 2021-04-20 — End: 2021-04-21
  Administered 2021-04-20 – 2021-04-21 (×3): 5000 [IU] via SUBCUTANEOUS

## 2021-04-20 MED ORDER — rifAXIMin (XIFAXAN) 550 mg Tab tablet
550 | ORAL_TABLET | Freq: Two times a day (BID) | ORAL | 2 refills | Status: AC
Start: 2021-04-20 — End: 2021-04-23
  Filled 2021-04-21: qty 60, 30d supply, fill #0

## 2021-04-20 MED FILL — PANTOPRAZOLE 40 MG TABLET,DELAYED RELEASE: 40 40 MG | ORAL | Qty: 1

## 2021-04-20 MED FILL — LEVOTHYROXINE 112 MCG TABLET: 112 112 MCG | ORAL | Qty: 1

## 2021-04-20 MED FILL — HEPARIN (PORCINE) 5,000 UNIT/ML INJECTION SOLUTION: 5000 5,000 unit/mL | INTRAMUSCULAR | Qty: 1

## 2021-04-20 MED FILL — KRISTALOSE 20 GRAM ORAL PACKET: 20 20 gram | ORAL | Qty: 1

## 2021-04-20 MED FILL — POTASSIUM CHLORIDE ER 20 MEQ TABLET,EXTENDED RELEASE(PART/CRYST): 20 20 MEQ | ORAL | Qty: 2

## 2021-04-20 MED FILL — URSODIOL 300 MG CAPSULE: 300 300 mg | ORAL | Qty: 2

## 2021-04-20 MED FILL — XIFAXAN 550 MG TABLET: 550 550 mg | ORAL | Qty: 1

## 2021-04-20 MED FILL — PREDNISONE 20 MG TABLET: 20 20 MG | ORAL | Qty: 3

## 2021-04-20 NOTE — Unmapped (Signed)
Department of Internal Medicine  History & Physical    Patient: Tracy Watkins  MRN: [id number]  CSN: [id number]    Chief Complaint     Abdominal pain     History of Present Illness     Tracy Watkins is a 25 y.o. 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 Patient was recently admitted for similar symptoms from 04/07/21 to 04/15/21 after her first cycle of IVF medication and egg retrieval on 04/03/21. She was ultimately discharged with prednisone 60mg  daily and instructions to discontinue azathioprine. Four days after discharge, she developed confusion, dyspnea, abdominal pain, and N/V, for which she was evaluated at OSH ED. Per report, patient was tachycardic and encephalopathic on arrival. Workup revealed bilateral pleural effusion and worsening biliary labs (AST 139, ALT 190, INR 1.7, tb 8.3, and Na 130), for which she received NS, albumin, Lasix, lactulose, and rifaximin. No ascitic fluid found on bedside US at the time. Patient was subsequently transferred to [facility] and admitted to the GI wards service.       Review of Systems     Review of Systems   Constitutional: Positive for malaise/fatigue. Negative for chills, fever and weight loss.        + 30 lb weight gain   HENT: Negative for congestion and sinus pain.    Eyes: Negative for blurred vision and double vision.   Respiratory: Positive for shortness of breath. Negative for wheezing.    Cardiovascular: Positive for chest pain (pressure) and leg swelling. Negative for palpitations.   Gastrointestinal: Positive for abdominal pain (epigastric), nausea and vomiting. Negative for blood in stool, heartburn and melena.   Genitourinary: Negative for dysuria and urgency.   Skin: Positive for itching (controlled). Negative for rash.   Neurological: Positive for headaches. Negative for dizziness.           Past Medical History     Past Medical History:    Diagnosis Date   ??? Acne    ??? Amenorrhea    ??? 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 Bilateral leg edema    ??? Cirrhosis (CMS Dx)    ??? Dysmenorrhea    ??? Esophageal varices (CMS Dx) 05/2016    Grade 1/small, no history of bleeding   ??? H/O infertility    ??? PCOS (polycystic ovarian syndrome)          Past Surgical History     Past Surgical History:   Procedure Laterality Date   ??? ESOPHAGOGASTRODUODENOSCOPY N/A 02/05/2021    Procedure: ESOPHAGOGASTRODUODENOSCOPY WITH MAC;  Surgeon: Wallene Dales, MD;  Location: [facility] ENDOSCOPY;  Service: Gastroenterology;  Laterality: N/A;   ??? ESOPHAGOGASTRODUODENOSCOPY N/A 02/05/2021    Procedure: EGD WITH BIOPSY;  Surgeon: Wallene Dales, MD;  Location: [facility] ENDOSCOPY;  Service: Gastroenterology;  Laterality: N/A;   ??? LIVER BIOPSY     ??? UPPER GASTROINTESTINAL ENDOSCOPY           Family History     Family History   Problem Relation Age of Onset   ??? Hearing loss Mother    ??? No Known Problems Father    ??? Pompe disease Maternal Grandfather    ??? Hearing loss Brother    ??? Hearing loss Maternal Grandmother    ??? Anesthesia problems Neg Hx          Social History     Social History     Socioeconomic  History   ??? Marital status: Married     Spouse name: Not on file   ??? Number of children: Not on file   ??? Years of education: Not on file   ??? Highest education level: Not on file   Occupational History   ??? Not on file   Tobacco Use   ??? Smoking status: Never Smoker   ??? Smokeless tobacco: Never Used   Vaping Use   ??? Vaping Use: Never used   Substance and Sexual Activity   ??? Alcohol use: No   ??? Drug use: No   ??? Sexual activity: Yes     Partners: Male     Birth control/protection: None   Other Topics Concern   ??? Caffeine Use Yes   ??? Occupational Exposure Not Asked   ??? Exercise Yes   ??? Seat Belt Yes   Social History Narrative   ??? Not on file     Social Determinants of Health     Financial Resource Strain: Not on file   Physical Activity: Not on file   Stress: Not on file   Social Connections: Not on file    Housing Stability: Not on file         Medications     Home Meds:  Prior to Admission medications    Medication Sig Start Date End Date Taking? Authorizing Provider   ACE AEROSOL CLOUD ENHANCER Spcr Inhale 1 each into the lungs if needed. 02/10/21   Historical Provider, MD   albuterol (PROAIR HFA) 90 mcg/actuation Inhl inhaler Inhale 2 puffs into the lungs every 4 hours as needed. 09/17/20 09/17/21  Historical Provider, MD   bromocriptine (PARLODEL) 2.5 mg tablet Take 2.5 mg by mouth daily.    Historical Provider, MD   cholestyramine-aspartame (PREVALITE) 4 gram PwPk packet Take 1 packet (4 g total) by mouth 2 times a day for 30 days. 04/15/21 05/15/21  Arvid Right, MD   ergocalciferol (ERGOCALCIFEROL) 1,250 mcg (50,000 unit) capsule Take 50,000 Units by mouth once a week. Patient taking twice a week    Historical Provider, MD   hydroCHLOROthiazide (HYDRODIURIL) 25 MG tablet Take 25 mg by mouth daily.    Historical Provider, MD   levothyroxine (SYNTHROID) 112 MCG tablet Take 112 mcg by mouth every morning before breakfast.    Historical Provider, MD   metFORMIN (GLUCOPHAGE-XR) 500 MG 24 hr tablet 2 TAB PO BID WITH MEALS Indications: polycystic ovarian syndrome 05/08/20   V. Despina Hick, MD   metoclopramide HCl (REGLAN) 10 MG tablet Take 10 mg by mouth if needed. 02/11/21   Historical Provider, MD   ondansetron (ZOFRAN-ODT) 4 MG disintegrating tablet Take 4 mg by mouth every 8 hours as needed. 02/10/21   Historical Provider, MD   pantoprazole (PROTONIX) 40 MG tablet Take 1 tablet (40 mg total) by mouth daily. 04/15/21   Arvid Right, MD   predniSONE (DELTASONE) 20 MG tablet Take 3 tablets (60 mg total) by mouth daily for 30 days. Indications: autoimmune hepatitis 04/16/21 05/16/21  Arvid Right, MD   prenatal vit,cal 74/iron/folic (PRENATAL VITAMIN 1+1 ORAL) Take by mouth.    Historical Provider, MD   ursodioL (ACTIGALL) 300 mg capsule Take 2 capsules (600 mg total) by mouth 2 times a day for 30 days. 04/15/21 05/15/21  Arvid Right, MD          Inpatient Meds:  Scheduled:  ??? heparin  5,000 Units Subcutaneous 3 times per day   ??? lactulose  20 g  Oral TID   ??? levothyroxine  112 mcg Oral QAM AC   ??? pantoprazole  40 mg Oral Daily 0900   ??? potassium chloride  40 mEq Oral Once   ??? predniSONE  60 mg Oral Daily 0900   ??? ursodioL  600 mg Oral BID       Continuous:      PRN:      Vital Signs     Temp:  [98.4 ??F (36.9 ??C)-98.5 ??F (36.9 ??C)] 98.5 ??F (36.9 ??C)  Heart Rate:  [95-98] 98  Resp:  [18] 18  BP: (124-134)/(69-76) 134/76  No intake or output data in the 24 hours ending 04/20/21 0550        Physical Exam     Physical Exam  Constitutional:       General: She is not in acute distress.     Appearance: Normal appearance. She is not ill-appearing.   HENT:      Head: Normocephalic and atraumatic.      Nose: Nose normal.      Mouth/Throat:      Mouth: Mucous membranes are moist.      Pharynx: Oropharynx is clear.   Eyes:      General: Scleral icterus present.      Extraocular Movements: Extraocular movements intact.   Cardiovascular:      Rate and Rhythm: Normal rate and regular rhythm.      Pulses: Normal pulses.      Heart sounds: Normal heart sounds. No murmur heard.  Pulmonary:      Effort: Pulmonary effort is normal. No respiratory distress.      Breath sounds: Examination of the right-lower field reveals decreased breath sounds. Examination of the left-lower field reveals decreased breath sounds. Decreased breath sounds present. No wheezing or rales.   Abdominal:      General: Bowel sounds are normal. There is no distension.      Palpations: Abdomen is soft.      Tenderness: There is abdominal tenderness. There is no guarding.   Musculoskeletal:         General: No tenderness or deformity. Normal range of motion.      Cervical back: Normal range of motion. No rigidity.   Lymphadenopathy:      Cervical: No cervical adenopathy.   Skin:     Coloration: Skin is jaundiced.   Neurological:      Mental Status: She is alert.             Laboratory  Data     CBC 04/20/2021                 \\    9.8   /         2.2  \\______/ 73                 /            \\                             /   28.1    \\ Differential 04/20/2021    N 63.3 L 23.2 M 9.8 E 2.1 B 1.6      Renal 04/20/2021       136       103         16    /    ___ __ _____ ______/ 92                                       \\  3.1         23       0.59   \\  Ca     8.5 (04/20/2021)  Mg     2.0 (04/15/2021)  Phos 3.3 (04/20/2021) Lipids    Lab Results   Component Value Date    CHOLTOT 143 04/16/2020    TRIG 55 04/16/2020    HDL 72 04/16/2020    LDL 59 04/16/2020      LFTs 04/20/2021               \\      10.4      /              \\    5.02    /         103  \\______/  153               /            \\              /    83      \\ PT/INR/PTT - 04/20/2021    PT  22.2 INR  1.9 PTT  No results found for requested labs within last 16109 hours.                  Lab 04/20/21  0222 04/15/21  0552   INR 1.9* 2.0*   PROTHROMBIN TIME 22.2* 23.3*             Invalid input(s): WBCCAST, GRANCAST          No results found for: NTPROBNP    Lab Results   Component Value Date    TSH <0.02 (L) 04/09/2021    FREET4 1.56 04/09/2021           Lab 04/15/21  0946 04/15/21  0859 04/14/21  2232 04/14/21  1810   POC GLU MONITORING DEVICE 91 61* 189* 133*         Diagnostic Studies     X-ray Portable Chest   Final Result   IMPRESSION:    Perihilar and bibasilar opacities, likely atelectasis.      Approved by Archie Balboa on 04/20/2021 4:42 AM EDT      I have personally reviewed the images and I agree with this report.      Report Verified by: Charlean Sanfilippo, MD at 04/20/2021 4:44 AM EDT      US Duplex Abd-Pel-Scrotum Comp    (Results Pending)         Assessment & Plan     Tracy Watkins is a 25 y.o. female being admitted to the hospital for cirrhosis.     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 Boykin Nearing. Not on transplant list. Previously admitted for acute liver injury with similar sx. Not currently on  maintenance therapy; taking prednisone 60mg  daily. Azathioprine d/c'd last admission d/t pancytopenia with neutropenia. Unclear etiology for slightly worsening LFTs - lack of response to steroids disfavors AIH flare. Low suspicion for SBP given lack of ascitic fluid pocket on bedside US at [facility]. Infectious w/u last admission neg. Possibly DILI 2/2 IVF medications vs OHSS, discussed below.   - Continue lactulose   - Continue prednisone  - [place] with Dopplers  - Hepatology consult ? transplant eval vs bx  - Patient with stable itching - continue ursodiol   - Low Na diet  MELD-Na score: 23 at 04/20/2021  2:22 AM  Calculated from:  Serum Creatinine: 0.59 mg/dL (Using min of 1 mg/dL) at 09/18/1094  0:45 AM  Serum Sodium: 136 mmol/L at 04/20/2021  2:22 AM  Total Bilirubin: 10.4 mg/dL at 4/0/9811  9:14 AM  INR(ratio): 1.9 at 04/20/2021  2:22 AM  Age: 19 years      # PCOS, IVF 03/04/21  # ? Ovarian hyperstimulation syndrome  Patient with N/V/abd pain, 30 lb weight gain, previous ascites, and hypoproteinemia consistent with OHSS, however sx overlap with cirrhosis and initiation of prednisone. Thrombus in LUE also discovered last admission. Unfortunately CBC unreliable for dx due to suspected drug-induced pancytopenia. OBGYN consulted last admission, did not feel sx were consistent with OHSS. On bromocriptine 2.5.   - Consider re-consulting OBGYN   - Continue home prenatal vitamin     # Pancytopenia  Stable from previous admission. ANC 1400 (lowest 613). Suspect 2/2 azathioprine.   - Transfuse PRBC if Hb <7, platelets if <10,000    # Hypothyroidism   - Continue home Synthroid     # Asthma  - Albuterol nebs prn       Nutrition:  Diet Orders  Report         Diet low sodium Sodium 2 gm (low) starting at 08/08 0407          Code Status: Full Code    Signed:  [facility], DO  04/20/2021, 5:50 AM

## 2021-04-20 NOTE — Unmapped (Signed)
[facility]  Case Management/Social Work Department  Discharge Planning Screen    Screening Questions     Do you need help filling out medical forms: No  Is patient from anywhere other than a private residence? (shelter, SNF, LTC, IPR, LTAC, etc.): No  Do you have any services that come into the house to help you? (COA, private duty, HHC, etc): No  Do you have barriers getting to follow up appointment or obtaining prescriptions?: No  Have you been to the (ED/hospital) 4x times in the past 6 months? : No    CM has completed a chart review and completed rounds with the team. Per team, the pt is not medically ready for D/C today. Patient's discharge needs were assessed and no additional needs were identified. Patient likely to discharge back to prior living arrangements with no additional services. CM to follow up if any needs arise at d/c.     Discharge Plan      Anticipated discharge plan:  Home with no needs                Anticipated discharge date:  08/11     CM/SW will continue to follow and remain available for discharge planning needs.       Rosine Door  RN Case Manager  [phone]

## 2021-04-20 NOTE — Unmapped (Addendum)
Internal Medicine - Updated Plan    Patient was discussed with the night resident and seen on rounds with the attending physician. Please see H&P for further details.     Latest Reference Range & Units 04/20/21 02:22   Protime 12.1 - 15.1 seconds 22.2 (H)   INR 0.9 - 1.1  1.9 (H)   (H): Data is abnormally high     Latest Reference Range & Units 04/20/21 02:22   Alkaline Phosphatase 36 - 125 U/L 83   AST (SGOT) 13 - 39 U/L 103 (H)   ALT (SGPT) 7 - 52 U/L 153 (H)   Albumin 3.5 - 5.7 g/dL 2.7 (L)   Protein, Total 6.4 - 8.9 g/dL 5.5 (L)   Bilirubin, Indirect 0.00 - 1.10 mg/dL 1.61 (H)   Bili, Total 0.0 - 1.5 mg/dL 09.6 (H)   Bilirubin, Direct 0.00 - 0.40 mg/dL 0.45 (H)   (H): Data is abnormally high  (L): Data is abnormally low        Assessment & Plan     ??  Tracy Watkins is a 25 y.o. 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    #Cirrhosis 2/2 AIH, d/b HE, NBEV, Ascites, Jaundice  - Pt recently hospitalized (07/26-08/03) with Acute Liver Injury following the first cycle of IVF medication and egg retrieval (07/22). Pt was diagnosed with AIH in June 2017; follows Dr Boykin Nearing. Was on [facility] transplant list, but removed 2021 d/t stability of dz. Previously treated with Cellcept (dc'd due to pt desire to try IVF) and Azothioprine (dc'd last admission d/t pancytopenia). Prior to June 2022, pt without AIH flares since 2017.   - Relative downtrend of LFTs compared to last admission not very reassuring, d/t cirrhosis. Synthetic dysfunction present, with hypoalbuminemia, elevated INR and thrombocytopenia. Unclear etiology, but less likely an AIH flare given lack of response to steroids. Low suspicion for SBP at this time, given lack of ascitic fluid seen OA with bedside US. Possibly DILI 2/2 IVF medications vs OHSS.    MELD-Na score:  23 at 04/20/2021  2:22 AM  Calculated from:  Serum Creatinine: 0.59 mg/dL (Using min of 1 mg/dL) at 4/0/9811  9:14 AM  Serum Sodium: 136 mmol/L at 04/20/2021  2:22 AM  Total Bilirubin: 10.4 mg/dL at 03/21/2955  2:13 AM  INR(ratio): 1.9 at 04/20/2021  2:22 AM  Age: 80 years     - Pt also with low-normal bG, likely related to cirrhosis and hepatic synthetic dysfunction. Will continue to monitor. However, endocrine without concern for now. If bG < 60 during this admission call Endocrine and order: Renal Panel, C Peptide, Insulin, Pro-insulin, and B-hydroxybutyrate. Labs prior to giving carbs, if possible.    - Consult Hepatology  - Continue Lactulose, Rifaximin  - Continue Prednisone 60  - F/u US with doppler  - Continue Ursodiol, for itching  - Low Na Diet    - Liver unable to do biopsy d/t elevated INR. IR unable to fit her in until Wednesday.    #Hypokalemia  - K 3.1 OA; repleted  - Will continue to monitor    #PCOS s/p recent IVF cycle with egg retrieval  - Last IVF Cycle 07/22  - Continue home prenatal vitamin    #Pancytopenia  - CBC stable since prior admission. ANC 1400 (lowest 613). Suspect 2/2 Azothioprine.   -  Transfuse pRBC if Hgb < 7, Plts if <10k    #Hypothyroidism  - Continue home Synthroid    #Asthma  - Continue Albuterol PRN    Code Status: Full Code  DVT Prophylaxis: SubQ Hep  GI Prophylaxis: Protonix  Nutrition: Low Na  Disposition Plan: GI Floor      Arvid Right, MD  PGY1 Internal Medicine  Pager: [phone]  04/20/2021, 5:04 AM

## 2021-04-20 NOTE — Unmapped (Signed)
[facility]  Medical Nutrition Therapy    Reason(s) for Completion: Physician/Nursing Referral: N/V/D for 3 or more days    Diet Order/Nutrition Support: Low Sodium 2 gm    Chief Complaint: Abdominal Pain    Pertinent Information: Tracy Watkins, a 25 y.o. 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 Patient was recently admitted for similar symptoms from 04/07/21 to 04/15/21 after her first cycle of IVF medication and egg retrieval on 04/03/21. She was ultimately discharged with prednisone 60mg  daily and instructions to discontinue azathioprine. Four days after discharge, she developed confusion, dyspnea, abdominal pain, and N/V, for which she was evaluated at OSH ED. Patient was subsequently transferred to [facility] and admitted to the GI wards service.    LOS 1: Pt seen at bedside, is feeling pretty good and reports being hungry. Pt reports that nausea is relatively common for her and she only vomited yesterday. Pt normally eats 2-3 meals per day. She also tries to snack. Bowels have been moving normally for her. UBW 218 lbs, pt states her weight increased quickly with fluids and steroids.     Patient Active Problem List   Diagnosis   ??? Autoimmune hepatitis (CMS Dx)   ??? Cirrhosis of liver (CMS Dx)   ??? Other acne   ??? PCOS (polycystic ovarian syndrome)   ??? Hyperandrogenemia   ??? Hyperandrogenism   ??? Infertility, anovulation   ??? Class 3 severe obesity due to excess calories without serious comorbidity in adult (CMS Dx)   ??? Elevated liver enzymes   ??? Hyperprolactinemia (CMS Dx)   ??? Nasal congestion     Past Medical History:   Diagnosis Date   ??? Acne    ??? Amenorrhea    ??? 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 Bilateral leg edema    ??? Cirrhosis (CMS Dx)    ??? Dysmenorrhea    ??? Esophageal varices (CMS Dx) 05/2016    Grade 1/small, no history of  bleeding   ??? H/O infertility    ??? PCOS (polycystic ovarian syndrome)        Scheduled Meds:   ??? heparin  5,000 Units Subcutaneous 3 times per day   ??? lactulose  20 g Oral TID   ??? levothyroxine  112 mcg Oral QAM AC   ??? pantoprazole  40 mg Oral Daily 0900   ??? potassium chloride  40 mEq Oral Once   ??? predniSONE  60 mg Oral Daily 0900   ??? ursodioL  600 mg Oral BID      PRN Meds:albuterol     Pertinent Labs:   Lab Results   Component Value Date    CREATININE 0.59 (L) 04/20/2021    BUN 16 04/20/2021    NA 136 04/20/2021    K 3.1 (L) 04/20/2021    CL 103 04/20/2021    CO2 23 04/20/2021     Lab Results   Component Value Date    CALCIUM 8.5 (L) 04/20/2021    PHOS 3.3 04/20/2021     Lab Results   Component Value Date    MG 2.0 04/15/2021     Lab Results   Component Value Date    GLUCOSE 92 04/20/2021     Lab Results   Component Value Date    WBC 2.2 (L) 04/20/2021     Lab Results   Component Value  Date    ALBUMIN 2.7 (L) 04/20/2021    ALBUMIN 2.7 (L) 04/20/2021     No results found for: PREALBUMIN  No results found for: CRP  Lab Results   Component Value Date    TRIG 55 04/16/2020     Temp (24hrs), Avg:98.5 ??F (36.9 ??C), Min:98.4 ??F (36.9 ??C), Max:98.7 ??F (37.1 ??C)    Skin Integrity: Braden Scale Score: 19    GI: Abdomen Inspection: Soft, Rounded  Palpation/Percussion: Soft, No guarding    Potential Nutrition Related Factor(s):  Nausea/Vomiting and Diarrhea/Constipation  Food Allergies/Intolerances: NKFA  Cultural Requests: none at this time    25 y.o.   Female   Ht Readings from Last 1 Encounters:   04/20/21 5' 6 (1.676 m)     Wt Readings from Last 25 Encounters:   04/20/21 (!) 256 lb 13.4 oz (116.5 kg)   04/15/21 (!) 247 lb 9.2 oz (112.3 kg)   02/05/21 (!) 230 lb (104.3 kg)   01/23/21 (!) 239 lb 9.6 oz (108.7 kg)   07/25/20 (!) 238 lb 12.8 oz (108.3 kg)   06/05/20 (!) 241 lb (109.3 kg)   05/08/20 (!) 242 lb (109.8 kg)   05/11/17 207 lb (93.9 kg)   04/16/20 (!) 251 lb (113.9 kg)   01/28/20 (!) 251 lb (113.9 kg)   07/09/19  (!) 230 lb (104.3 kg)   07/17/18 209 lb (94.8 kg)   01/23/18 209 lb (94.8 kg)   07/18/17 207 lb (93.9 kg)   03/14/17 199 lb (90.3 kg)   01/17/17 195 lb (88.5 kg)   01/17/17 195 lb (88.5 kg)   01/17/17 195 lb (88.5 kg)   12/20/16 198 lb (89.8 kg)   12/20/16 198 lb (89.8 kg)   12/14/16 197 lb 9.6 oz (89.6 kg)      Body mass index is 41.45 kg/m??.   BMI Class: Class 3 (40.00 - and above) BMI  Usual Weight: 218 lbs  Ideal Body Weight (+/- 10%) 130 lbs (59.1 kg)  Weight History: see above     Estimated Nutrition Needs   (based on clinical status at this time and subject to change):   Needs based On: 116.5 kg CBW  Kcals/day: 2000 - 2300 (MSJ, AF 1.1-1.2)  Protein g/day: 116 - 128 (1 - 1.1 gm/kg)  Carbohydrate g/day: No hx of Diabetes  Fluid ml/day: 1 ml/kcal      Nutrition Related Problems:   Nutrition Diagnosis: No Nutrition Diagnosis at this time  Related To: N/A  As Evidenced By: N/A    Recommended Interventions: Monitor PO Intake/Tolerance  Goals:Not Applicable                Nutrition Status Classification: Mildly Compromised                                                  Follow up per policy while inpatient     Nutrition Transition of Care Plan: Discharge Plan of Care for nutrition (ongoing pending clinical course)    Recommendation(s):   ??? Monitor PO intakes, tolerance, weights, labs, BMs, and additional nutrition intervention needs    Earnest Rosier RD, LD  Clinical Dietitian  Please contact via Epic secure chat

## 2021-04-20 NOTE — Unmapped (Addendum)
Interventional Radiology Consult Note       Date: 04/20/2021  Patient: Tracy Watkins  DOB: 1996-08-12  MRN: [id number]    Primary Care Provider: Faylene Million, DO  Requesting Provider: Clayborn Heron, MD  Chief Complaint: Acute liver injury  Reason for Consult: liver biopsy    IR Procedure Request Received and Reviewed.    HPI:   Zakirah Weingart is a 25 y.o. 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 LFTs elevated, worsening. IR consulted for percutaneous liver biopsy.     Plt 73  INR 1.9  RA, VSS  BMI 41    History:   Past Medical History:   Diagnosis Date   ??? Acne    ??? Amenorrhea    ??? 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 Bilateral leg edema    ??? Cirrhosis (CMS Dx)    ??? Dysmenorrhea    ??? Esophageal varices (CMS Dx) 05/2016    Grade 1/small, no history of bleeding   ??? H/O infertility    ??? PCOS (polycystic ovarian syndrome)      Past Surgical History:   Procedure Laterality Date   ??? ESOPHAGOGASTRODUODENOSCOPY N/A 02/05/2021    Procedure: ESOPHAGOGASTRODUODENOSCOPY WITH MAC;  Surgeon: Wallene Dales, MD;  Location: [facility] ENDOSCOPY;  Service: Gastroenterology;  Laterality: N/A;   ??? ESOPHAGOGASTRODUODENOSCOPY N/A 02/05/2021    Procedure: EGD WITH BIOPSY;  Surgeon: Wallene Dales, MD;  Location: [facility] ENDOSCOPY;  Service: Gastroenterology;  Laterality: N/A;   ??? LIVER BIOPSY     ??? UPPER GASTROINTESTINAL ENDOSCOPY       Family History   Problem Relation Age of Onset   ??? Hearing loss Mother    ??? No Known Problems Father    ??? Pompe disease Maternal Grandfather    ??? Hearing loss Brother    ??? Hearing loss Maternal Grandmother    ??? Anesthesia problems Neg Hx        Social Hx:  Social History     Tobacco Use   Smoking Status Never Smoker   Smokeless Tobacco Never Used     Social History     Substance and Sexual Activity   Drug Use No      Social History     Substance and Sexual Activity   Alcohol Use No       Allergies:  Allergies   Allergen Reactions   ??? Latex Rash     Other reaction(s): Rash, urticarial  hives  hives     ??? Penicillins Rash   ??? 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        Home Medications:  Prior to Admission medications    Medication   ACE AEROSOL CLOUD ENHANCER Spcr   albuterol (PROAIR HFA) 90 mcg/actuation Inhl inhaler   bromocriptine (PARLODEL) 2.5 mg tablet   cholestyramine-aspartame (PREVALITE) 4 gram PwPk packet   ergocalciferol (ERGOCALCIFEROL) 1,250 mcg (50,000 unit) capsule   hydroCHLOROthiazide (HYDRODIURIL) 25 MG tablet   levothyroxine (SYNTHROID) 112 MCG tablet   metFORMIN (GLUCOPHAGE-XR) 500 MG 24 hr tablet   metoclopramide HCl (REGLAN) 10 MG tablet   ondansetron (ZOFRAN-ODT) 4 MG disintegrating tablet   pantoprazole (PROTONIX) 40 MG tablet   predniSONE (DELTASONE) 20 MG tablet   prenatal vit,cal 74/iron/folic (PRENATAL VITAMIN 1+1 ORAL)   ursodioL (  ACTIGALL) 300 mg capsule       Current Medications:     Scheduled Medications:    heparin, 5,000 Units, 3 times per day  lactulose, 20 g, TID  levothyroxine, 112 mcg, QAM AC  pantoprazole, 40 mg, Daily 0900  predniSONE, 60 mg, Daily 0900  ursodioL, 600 mg, BID        IV Meds:       PRN Medications:  albuterol, 2.5 mg, RT Q6H PRN        ROS:  Negative General: Denies fever, chills or weakness  Cardiovascular: Denies chest pain, chest pressure or palpitations  Respiratory: Denies shortness of breath, wheezing or cough  GI: Denies abdominal pain or n/v/d  GU: Denies pain with urination or blood in urine  Skin: Denies rashes or bruising  Neurological: Denies headaches, dizziness, gait problems, or seizures  Extremities: Denies swelling or numbness/tingling  Psychiatric: Denies hallucinations and memory loss    Vitals:     Vitals:    04/20/21 0542 04/20/21 0801 04/20/21 0922 04/20/21 1059   BP: 134/76 120/62     BP  Location: Left arm Left arm     Patient Position: Lying Lying     Pulse: 98 92 141 98   Resp: 18 16     Temp: 98.5 ??F (36.9 ??C) 98.7 ??F (37.1 ??C)     TempSrc: Oral Oral     SpO2: 98% 93% 100%    Weight:       Height:           PE:  Gen: Awake, alert, no apparent distress  HEENT: Normocephalic, atraumatic, mucous membranes pink and moist  Neck: Supple, symmetrical, trachea midline   Chest: respirations unlabored  CVS: RRR  ABD: Soft, non-tender  GU: deferred  EXT: Warm and well perfused  NEURO: No focal deficits  PSYCH: Appropriate affect     Labs:   Recent Labs     04/14/21  1219 04/15/21  0552 04/20/21  0222   WBC 1.7* 1.7* 2.2*   HGB 9.2* 9.2* 9.8*   HCT 26.2* 25.6* 28.1*   MCV 115.1* 114.0* 115.9*   PLT 81* 70* 73*     Recent Labs     04/14/21  2056 04/14/21  2302 04/15/21  0552 04/20/21  0222   NA 135  --  138 136   K 7.3* 4.1 4.1 3.1*   CL 106  --  110 103   CO2 24  --  24 23   PHOS 3.4  --  2.9 3.3   BUN 16  --  17 16   CREATININE 0.65  --  0.53* 0.59*   CALCIUM 8.0*  --  7.8* 8.5*     Recent Labs     04/07/21  0149 04/07/21  1745 04/11/21  1129 04/11/21  1755 04/14/21  1219 04/15/21  0552 04/20/21  0222   BILITOT 13.8*   < >  --    < > 9.1* 8.6* 10.4*   AST 623*   < >  --    < > 109* 89* 103*   ALT 463*   < >  --    < > 185* 163* 153*   ALKPHOS 106   < >  --    < > 81 71 83   LIPASE 72  --  86*  --   --   --   --     < > = values in this interval not displayed.  Recent Labs     04/14/21  1219 04/14/21  2056 04/15/21  0552 04/20/21  0222   ALBUMIN 2.2*   2.2*   < > 2.1*   2.1* 2.7*   2.7*   BILIDIRECT 5.07*  --  4.67* 5.02*    < > = values in this interval not displayed.     Recent Labs     04/13/21  0522 04/15/21  0552 04/20/21  0222   INR 1.7* 2.0* 1.9*   PROTIME 20.8* 23.3* 22.2*       Imaging:  No results found for this or any previous visit from the past 72 hours.      No results found for this or any previous visit from the past 72 hours.        I personally reviewed the relevant findings from the  above imaging results.    Pre-Sedation Evaluation  Mallampati:  II  Mouth Opening:  > 4 cm  Facial Hair: No  Short Neck: No      ASA Score: I - Normal healthy patient  Sedation-Specific History Concerns: None    This patient was re-evaluated immediately prior to sedation administration.          Medical Decision Making     Assessment:   Marvena Tally is a 25 y.o. 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 LFTs elevated, worsening. IR consulted for percutaneous liver biopsy.     The patient has ascites noted on ultrasound exam. If ascites remains at time of the procedure, IR will need to perform a paracentesis prior to the biopsy.   Additionally, the patient has an INR of 1.9 Discussed goal of < 1.8 necessary for percutaneous liver biopsy.     Plan:  1. Percutaneous random liver biopsy with possible paracentesis Wednesday 04/22/21  2. Sedation Plan/Type: Local/Subcutaneous Lidocaine 1% and Moderate Sedation/Intravenous Fentanyl & Versed  3. NPO after midnight. Patient may have sips of water with morning medication.  4. If contrast allergy, please pre-medicate with Prednisone 50mg  PO at 13 hour, 7 hours, and 1 hour before procedure. Benadryl 50mg  PO 1 hour before procedure.  5. Hold SQ heparin from midnight or Lovenox 24 hrs.  6. Defer INR correction with goal of < 1.8 to primary team. Discussed with GI    Consent:  Consent pending    IR procedure was reviewed with Leanora Ivanoff,  MD    Plan discussed with GI Ward team 04/20/2021, 11:17 AM.    Please call with any questions.    Additional Time Spent:  Time Spent with Patient:  Total face-to-face time spent with patient (greater than 50% of the visit was for the purpose of discussion and counseling): 35 minutes.       Sheryle Spray, CNP  Vascular & Interventional  Radiology  04/20/2021,11:17 AM  [facility]: [phone])

## 2021-04-20 NOTE — Unmapped (Signed)
Department of Digestive Diseases  Hepatology Consult Note    Name: Tracy Watkins  MRN: [id number]  CSN: [id number]    Consulted by: Lucrezia Europe, MD  Reason for consult: AIH      History of Present Illness:  Tracy Watkins??is a 25 y.o.??female with a PMHx of Cirrhosis due to Autoimmune Hepatitis, PCOS who presents as a direct transfer worsening abdominal discomfort, swelling and difficultly breathing.    Patient was recently admitted with symptoms of dizziness, jaundice started about 1.5 - 2 weeks ago, shortly after the start of starting injection as part of an In vitro fertilization protocol. On arrival at that time, liver enzymes were notable for AlkP of 106, AST/ALT of 623/463 and tbili 13.8, having all been WNL on 12/16/20. Started on Prednisone 40mg  on 7/26 in treatment of AIH flare however this was increased to 60mg  due to lack of response. While liver biopsy was planned then, she because neutropenic and it was deferred given her improvement on prednisone. Imuran was held for this neutropenia and TPMT was checked. She was discharged on 60mg  prednisone with plans to follow up outpatient.     Following discharge, swelling of legs and abdomen became worse and due to trouble walking she presented to a OSH. States she was given lasix and a laxative and is currently feeling better. On arrival here, LFT are similar to that at time of discharge though bilirubin is slightly higher at 10.4 from 8.6. INR is elevated at 1.9.    Review of Systems   Positive for LE edema, abdominal discomfort.  10 system otherwise normal.    Past Medical History:   Diagnosis Date   ??? Acne    ??? Amenorrhea    ??? 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 Bilateral leg edema    ??? Cirrhosis (CMS Dx)    ??? Dysmenorrhea    ??? Esophageal varices (CMS Dx) 05/2016    Grade 1/small, no history of bleeding   ??? H/O infertility    ??? PCOS (polycystic ovarian syndrome)         Past Surgical History:   Procedure Laterality Date   ???  ESOPHAGOGASTRODUODENOSCOPY N/A 02/05/2021    Procedure: ESOPHAGOGASTRODUODENOSCOPY WITH MAC;  Surgeon: Wallene Dales, MD;  Location: [facility] ENDOSCOPY;  Service: Gastroenterology;  Laterality: N/A;   ??? ESOPHAGOGASTRODUODENOSCOPY N/A 02/05/2021    Procedure: EGD WITH BIOPSY;  Surgeon: Wallene Dales, MD;  Location: [facility] ENDOSCOPY;  Service: Gastroenterology;  Laterality: N/A;   ??? LIVER BIOPSY     ??? UPPER GASTROINTESTINAL ENDOSCOPY          Family History   Problem Relation Age of Onset   ??? Hearing loss Mother    ??? No Known Problems Father    ??? Pompe disease Maternal Grandfather    ??? Hearing loss Brother    ??? Hearing loss Maternal Grandmother    ??? Anesthesia problems Neg Hx         Social History     Tobacco Use   ??? Smoking status: Never Smoker   ??? Smokeless tobacco: Never Used   Substance Use Topics   ??? Alcohol use: No        Allergies   Allergen Reactions   ??? Latex Rash     Other reaction(s): Rash, urticarial  hives  hives     ??? Penicillins Rash   ??? 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  Scheduled Meds:  ??? heparin  5,000 Units Subcutaneous 3 times per day   ??? lactulose  20 g Oral TID   ??? levothyroxine  112 mcg Oral QAM AC   ??? pantoprazole  40 mg Oral Daily 0900   ??? predniSONE  60 mg Oral Daily 0900   ??? rifAXIMin  550 mg Oral BID   ??? ursodioL  600 mg Oral BID     Continuous Infusions:    PRN Meds:  albuterol    Prior to Admission Meds:  Medications Prior to Admission   Medication Sig Dispense Refill Last Dose   ??? ACE AEROSOL CLOUD ENHANCER Spcr Inhale 1 each into the lungs if needed.      ??? albuterol (PROAIR HFA) 90 mcg/actuation Inhl inhaler Inhale 2 puffs into the lungs every 4 hours as needed.      ??? bromocriptine (PARLODEL) 2.5 mg tablet Take 2.5 mg by mouth daily.      ??? cholestyramine-aspartame (PREVALITE) 4 gram PwPk packet Take 1 packet (4 g total) by mouth 2 times a day for 30 days. 60 packet 0    ??? ergocalciferol (ERGOCALCIFEROL) 1,250 mcg (50,000 unit)  capsule Take 50,000 Units by mouth once a week. Patient taking twice a week      ??? hydroCHLOROthiazide (HYDRODIURIL) 25 MG tablet Take 25 mg by mouth daily.      ??? levothyroxine (SYNTHROID) 112 MCG tablet Take 112 mcg by mouth every morning before breakfast.      ??? metFORMIN (GLUCOPHAGE-XR) 500 MG 24 hr tablet 2 TAB PO BID WITH MEALS Indications: polycystic ovarian syndrome 120 tablet 4    ??? metoclopramide HCl (REGLAN) 10 MG tablet Take 10 mg by mouth if needed.      ??? ondansetron (ZOFRAN-ODT) 4 MG disintegrating tablet Take 4 mg by mouth every 8 hours as needed.      ??? pantoprazole (PROTONIX) 40 MG tablet Take 1 tablet (40 mg total) by mouth daily. 30 tablet 0    ??? predniSONE (DELTASONE) 20 MG tablet Take 3 tablets (60 mg total) by mouth daily for 30 days. Indications: autoimmune hepatitis 90 tablet 0    ??? prenatal vit,cal 74/iron/folic (PRENATAL VITAMIN 1+1 ORAL) Take by mouth.      ??? ursodioL (ACTIGALL) 300 mg capsule Take 2 capsules (600 mg total) by mouth 2 times a day for 30 days. 120 capsule 0        Vitals:  Temp:  [98.1 ??F (36.7 ??C)-98.7 ??F (37.1 ??C)] 98.2 ??F (36.8 ??C)  Heart Rate:  [92-141] 106  Resp:  [16-18] 16  BP: (114-134)/(55-76) 114/71    Intake/Output Summary (Last 24 hours) at 04/20/2021 1800  Last data filed at 04/20/2021 1535  Gross per 24 hour   Intake 240 ml   Output --   Net 240 ml       Physical Exam   Gen: Well-developed, well nourished. No acute distress.   HEENT: Nares patent. Mucous membranes pink/moist. No scleral icterus.   Neck: Neck is supple. No JVD present. No tracheal deviation. No cervical lymphadenopathy.  CV: Regular rate and rhythm. Normal S1 and S2. No murmurs/rubs/gallops.   Lungs: Clear to auscultation bilaterally. No wheezes/rales/rhonchi. No respiratory distress.   Abdomen: Soft, nontender, nondistended, no hepatosplenomegaly or palpable masses. Mild tenderness  Extremities: Compression stockings on. Edema present  Skin: No bruising or rash noted. No spider angiomata or  palmar erythema.   Neuro: Alert and oriented to person, place, and time. CN 2-12 grossly intact.  No gross motor defecits. No asterixis  Psych: Normal mood and affect. Normal speech and behavior.     Laboratory:  Lab Results   Component Value Date    WBC 2.2 (L) 04/20/2021    HGB 9.8 (L) 04/20/2021    HCT 28.1 (L) 04/20/2021    MCV 115.9 (H) 04/20/2021    PLT 73 (L) 04/20/2021     Lab Results   Component Value Date    NA 134 04/20/2021    K 4.9 04/20/2021    CL 105 04/20/2021    CO2 22 04/20/2021    BUN 18 04/20/2021    CREATININE 0.66 04/20/2021    GLUCOSE 147 (H) 04/20/2021    CALCIUM 8.5 (L) 04/20/2021    PHOS 2.5 04/20/2021     Lab Results   Component Value Date    AST 103 (H) 04/20/2021    ALT 153 (H) 04/20/2021    BILITOT 10.4 (H) 04/20/2021    BILIDIRECT 5.02 (H) 04/20/2021    PROT 5.5 (L) 04/20/2021    ALBUMIN 2.8 (L) 04/20/2021    ALKPHOS 83 04/20/2021     Lab Results   Component Value Date    INR 1.9 (H) 04/20/2021        No results found for: AMYLASE  Lab Results   Component Value Date    LIPASE 86 (H) 04/11/2021       Lab Results   Component Value Date    HEPAIGM Nonreactive 04/07/2021    HEPBCAB Nonreactive 04/07/2021     No results found for: ANA  No results found for: SMOOTHMUSCAB  Lab Results   Component Value Date    IRON 98 12/20/2016    TIBC 267 12/20/2016    FERRITIN 28.6 12/20/2016     No results found for: OCCULTBLD    No results found for: AFP  No results found for: CEA  No results found for: CA125    No results found for: VITAMINB12  No results found for: FOLATE    Microbiology:   Lab Results   Component Value Date    LABGRAM Gram Positive Cocci In Clusters 04/07/2021    LABGRAM  04/07/2021     The critical result was called to, and read back by, licensed caregiver Oscar La at 07:50 on 04-08-2021 by CGS.;        Images:  US Duplex Abd-Pel-Scrotum Comp   Final Result   IMPRESSION:      ABDOMEN      1.  Cirrhotic liver with no focal abnormalities.   2.  Small volume ascites.   3.  Splenomegaly.       LIVER DOPPLER      1.  Hepatic arteries demonstrate normal antegrade flow and resistive indices.   2.  Recanalized periumbilical vein, consistent with sequela of portal hypertension.         Approved by Elmer Ramp, DO on 04/20/2021 11:02 AM EDT      I have personally reviewed the images and I agree with this report.      Report Verified by: Fulton Reek, MD at 04/20/2021 11:10 AM EDT      US Abdomen Limited   Final Result   IMPRESSION:      ABDOMEN      1.  Cirrhotic liver with no focal abnormalities.   2.  Small volume ascites.   3.  Splenomegaly.      LIVER DOPPLER      1.  Hepatic arteries demonstrate  normal antegrade flow and resistive indices.   2.  Recanalized periumbilical vein, consistent with sequela of portal hypertension.         Approved by Elmer Ramp, DO on 04/20/2021 11:02 AM EDT      I have personally reviewed the images and I agree with this report.      Report Verified by: Fulton Reek, MD at 04/20/2021 11:10 AM EDT      X-ray Portable Chest   Final Result   IMPRESSION:    Perihilar and bibasilar opacities, likely atelectasis.      Approved by Archie Balboa on 04/20/2021 4:42 AM EDT      I have personally reviewed the images and I agree with this report.      Report Verified by: Charlean Sanfilippo, MD at 04/20/2021 4:44 AM EDT            Assessment/Plan:  Tracy Watkins??is a 25 y.o.??female with a PMHx of Cirrhosis due to Autoimmune Hepatitis, PCOS who presents as a direct transfer worsening abdominal discomfort, swelling and difficultly breathing.    #Autoimmune Hepatitis related cirrhosis: Concern for ongoing flare with some improvement on steroids. Symptoms of LE swellling likely worse with prednisone. While her metabolites were not within toxic levels, her Leukopenia does appear to have improved off the Imuran. Given the severity of her disease noted by her cirrhosis and history of AIH flares, she will likely need a second line therapy such as tacrolimus.  -Plan for liver biopsy with IR on  Wednesday, earlier if possible  -Continue steroids for now  -Check IgG tomorrow  -Can give small dose lasix for LE swelling  -Close monitoring of mental status. Currently at baseline. Avoid sedative medications.  -Continue Ursodiol for itching.        Case to be discussed with attending physician Raleigh Callas. Recommendations final following attestation.    Samson Frederic, MD  Gastroenterology/Hepatology Fellow- PGY 6  Pager [phone]

## 2021-04-20 NOTE — Unmapped (Signed)
Endocrine Consultation Note    Date of admission: 04/20/2021  Date/Time of Consult: 04/20/2021, 1:36 PM  Attending: Lucrezia Europe, MD  Consultant attending: Dr. Noel Gerold    Reason for consult: Hypoglycemia, on Bromocriptine for Hyperprolactinemia      HPI:   Tracy Watkins is a 25 y.o. 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 She was recently admitted with similar complaints from 7/26-8/3. Shortly after she received her first cycle of IVF and and egg retrieval on 04/03/21.  She was previously controlled on a stable dose of imuran however developed neutropenia likely due to imuran and was started on Prednisone 60 mg 7/26. Endocrine consulted for evaluation of hypoglycemia: Patient had an episode of low BG on previous admission. Lowest was 59. Patient reports symptoms during that episode with shaking and feeling like her body is going to crash. Symptoms improved with food intake. Reports these has been going on for 5-6 months. Has 3-4 episodes / week, most occur during fasting, but this doesn't wake her up in the middle of the night.     Patient reports her bromocriptine was started ~3 years ago. Was told she had an elevated PRL level, but this was evaluated for inferility. Patient denies any amenorhea ( does have irregular menstrual cycle from PCOS), galactorrhea, or issues with peripheral vision. 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 She's currently following with reproductive Endocrine Dr. Tiburcio Pea for the management of this.     Hypothyroidism: Reports she was diagnosed within the past year, and has been intermittently on levothyroxine based on her labs. Most recently on levothyroxine 112 mcg daily for ~ 1 month. Reviewed that she is taking it appropriately. Denies any symptoms of hyper/hypothyrodism ( fatigue started a few days prior to admission).           Current  symptoms:     ROS:   General: Alert, oriented, intentional weight loss   HEENT: no HA, peripheral vision changes  CV: no CP/DOE/palpitations  Pulm: no SOB, wheezing  GI: no abd pain, +nausea, no d/c  Endocrine: heat/cold intolerance, diarrhea, constipation, no galactorrhea, +ve irregular menstruation    History    Past Medical History:   Diagnosis Date   ??? Acne    ??? Amenorrhea    ??? 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 Bilateral leg edema    ??? Cirrhosis (CMS Dx)    ??? Dysmenorrhea    ??? Esophageal varices (CMS Dx) 05/2016    Grade 1/small, no history of bleeding   ??? H/O infertility    ??? PCOS (polycystic ovarian syndrome)      Past Surgical History:   Procedure Laterality Date   ??? ESOPHAGOGASTRODUODENOSCOPY N/A 02/05/2021    Procedure: ESOPHAGOGASTRODUODENOSCOPY WITH MAC;  Surgeon: Wallene Dales, MD;  Location: [facility] ENDOSCOPY;  Service: Gastroenterology;  Laterality: N/A;   ??? ESOPHAGOGASTRODUODENOSCOPY N/A 02/05/2021    Procedure: EGD WITH BIOPSY;  Surgeon: Wallene Dales, MD;  Location: [facility] ENDOSCOPY;  Service: Gastroenterology;  Laterality: N/A;   ??? LIVER BIOPSY     ??? UPPER GASTROINTESTINAL ENDOSCOPY       Family History   Problem Relation Age of Onset   ??? Hearing loss Mother    ??? No Known Problems Father    ??? Pompe disease Maternal Grandfather    ??? Hearing loss Brother    ??? Hearing loss Maternal Grandmother    ???  Anesthesia problems Neg Hx      Social History     Socioeconomic History   ??? Marital status: Married     Spouse name: Not on file   ??? Number of children: Not on file   ??? Years of education: Not on file   ??? Highest education level: Not on file   Occupational History   ??? Not on file   Tobacco Use   ??? Smoking status: Never Smoker   ??? Smokeless tobacco: Never Used   Vaping Use   ??? Vaping Use: Never used   Substance and Sexual Activity   ??? Alcohol use: No   ??? Drug use: No   ??? Sexual activity: Yes     Partners: Male     Birth control/protection: None   Other Topics Concern   ??? Caffeine Use Yes   ??? Occupational  Exposure Not Asked   ??? Exercise Yes   ??? Seat Belt Yes   Social History Narrative   ??? Not on file     Social Determinants of Health     Financial Resource Strain: Not on file   Food Insecurity: No Food Insecurity   ??? Worried About Programme researcher, broadcasting/film/video in the Last Year: Never true   ??? Ran Out of Food in the Last Year: Never true   Physical Activity: Not on file   Stress: Not on file   Social Connections: Not on file   Housing Stability: Not on file       Allergies   Allergen Reactions   ??? Latex Rash     Other reaction(s): Rash, urticarial  hives  hives     ??? Penicillins Rash   ??? 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       Current Facility-Administered Medications   Medication Dose Route Frequency Provider Last Rate Last Admin   ??? albuterol (PROVENTIL) nebulizer solution 2.5 mg  2.5 mg Nebulization RT Q6H PRN Arville Go, MD       ??? heparin (porcine) injection 5,000 Units  5,000 Units Subcutaneous 3 times per day Minette Headland, DO   5,000 Units at 04/20/21 0226   ??? lactulose (CEPHULAC) packet 20 g  20 g Oral TID Arville Go, MD   20 g at 04/20/21 0941   ??? levothyroxine (SYNTHROID) tablet 112 mcg  112 mcg Oral QAM AC Arville Go, MD   112 mcg at 04/20/21 0539   ??? pantoprazole (PROTONIX) EC tablet 40 mg  40 mg Oral Daily 0900 Arville Go, MD   40 mg at 04/20/21 0539   ??? predniSONE (DELTASONE) tablet 60 mg  60 mg Oral Daily 0900 Arville Go, MD   60 mg at 04/20/21 0941   ??? ursodioL (ACTIGALL) capsule 600 mg  600 mg Oral BID Arville Go, MD   600 mg at 04/20/21 0941       Physical Exam    BP 117/55 (BP Location: Left arm, Patient Position: Lying)    Pulse 102    Temp 98.1 ??F (36.7 ??C) (Oral)    Resp 16    Ht 5' 6 (1.676 m)    Wt (!) 256 lb 13.4 oz (116.5 kg)    SpO2 98%    BMI 41.45 kg/m??     General appearance: NAD, AAOx3, appears stated age and cooperative  Head: Normocephalic, without obvious abnormality, atraumatic  Eyes: scleral icterus, pupils are symmetric    Lungs: clear to  auscultation bilaterally  Heart: tachycardic, regular rhythm, S1, S2 normal,  Abdomen: soft, non-tender; bowel sounds normal;   Extremities: +ve non pitting edema in b/l LE  Skin: Skin color, texture, turgor normal. Brusing on upper extremities due to IV access                Latest Reference Range & Units 04/20/21 02:22   Sodium 133 - 146 mmol/L 136   Potassium 3.5 - 5.3 mmol/L 3.1 (L)   Chloride 98 - 110 mmol/L 103   Carbon Dioxide (CO2) 21 - 33 mmol/L 23   Anion Gap 3 - 16 mmol/L 10   BUN 7 - 25 mg/dL 16   Creatinine 1.61 - 1.30 mg/dL 0.96 (L)   Glucose 70 - 100 mg/dL 92   EGFR  >04   Calcium 8.6 - 10.3 mg/dL 8.5 (L)   Phosphorus 2.1 - 4.7 mg/dL 3.3   Albumin 3.5 - 5.7 g/dL 2.7 (L)   Calculated Osmolality, Serum 278 - 305 mOsm/kg 283   Alkaline Phosphatase 36 - 125 U/L 83   AST (SGOT) 13 - 39 U/L 103 (H)   ALT (SGPT) 7 - 52 U/L 153 (H)   Albumin 3.5 - 5.7 g/dL 2.7 (L)   Protein, Total 6.4 - 8.9 g/dL 5.5 (L)   Bilirubin, Indirect 0.00 - 1.10 mg/dL 5.40 (H)   Bili, Total 0.0 - 1.5 mg/dL 98.1 (H)   Bilirubin, Direct 0.00 - 0.40 mg/dL 1.91 (H)   WBC 3.8 - 47.8 10E3/uL 2.2 (L)   RBC 3.80 - 5.10 10E6/uL 2.42 (L)   Hemoglobin 11.7 - 15.5 g/dL 9.8 (L)   Hematocrit 29.5 - 45.0 % 28.1 (L)   MCV 80.0 - 100.0 fL 115.9 (H)   MCH 27.0 - 33.0 pg 40.6 (H)   MCHC 32.0 - 36.0 g/dL 62.1   RDW 30.8 - 65.7 % 24.0 (H)   Platelet Count 140 - 400 10E3/uL 73 (L)   Platelet Estimate  Decreased   MPV 7.5 - 11.5 fL 7.4 (L)   PLT Morphology  Platelet morphology appears normal   Scan Result  PERFORMED   Neutrophils Relative 40.0 - 80.0 % 63.3   Lymphocytes Relative 15.0 - 45.0 % 23.2   Monocytes Relative 0.0 - 12.0 % 9.8   Eosinophils Relative 0.0 - 8.0 % 2.1   Basophils Relative 0.0 - 1.0 % 1.6 (H)   nRBC 0 - 0 /100 WBC 0   Neutrophils Absolute 1,500 - 7,800 /uL 1,393 (L)   Absolute Lymphocytes 850 - 3,900 /uL 510 (L)   Monocytes Absolute 200 - 950 /uL 216   Eosinophils Absolute 15 - 500 /uL 46   Basophils Absolute 0 - 200  /uL 35   Protime 12.1 - 15.1 seconds 22.2 (H)   INR 0.9 - 1.1  1.9 (H)           Assessment:   Tracy Watkins is a 25 y.o. 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 She was recently admitted with similar complaints from 7/26-8/3. Shortly after she received her first cycle of IVF and and egg retrieval on 04/03/21.  She was previously controlled on a stable dose of imuran however developed neutropenia likely due to imuran and was started on Prednisone 60 mg 7/26. Endocrine consulted for evaluation of hypoglycemia, and concerns of hx of hyperprolactinemia as she's on bromocriptine     #Hypoglycemia Evaluation   - Patient does report  whipple triad on previous admission with lowest BG 59.   - Lows during morning. No hypoglycemia during current admission  - Likely etiology is cirrhosis. Not suspecting adrenal insufficiency ( on prednisone 60mg  which is supra-physiological doses of steroids), hypothyroidism, denies excessive etoh intake. .  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 Reports she was started on this for infertility  - Last PRL level 10 06/2020.   - Follows with reproductive endocrine at [facility] Dr. Tiburcio Pea.     #Hypothyroidism  - reports she was diagnosed earlier this year, but also says its been intermittent. Concerned her abn TFTs are due to drugs that cause abnormal thyroid function tests.   - Home dose: levothyroxine 112 mcg daily.       Recommendations:  - Recommend small frequent meals to prevent low BG. Can take diabetic bars at bedtime to prevent fasting morning low BG.  - Continue to follow with reproductive endocrine for bromocriptine moniotoring  - Repeat TFTs with AM labs.     Joaquin Courts, M.D.  Endocrinology Fellow PGY-5  The patient was discussed with attending Dr. Noel Gerold

## 2021-04-21 LAB — RENAL FUNCTION PANEL W/EGFR
Albumin: 2.7 g/dL (ref 3.5–5.7)
Albumin: 2.5 g/dL (ref 3.5–5.7)
Anion Gap: 6 mmol/L (ref 3–16)
Anion Gap: 5 mmol/L (ref 3–16)
BUN: 16 mg/dL (ref 7–25)
BUN: 17 mg/dL (ref 7–25)
CO2: 26 mmol/L (ref 21–33)
CO2: 26 mmol/L (ref 21–33)
Calcium: 8.8 mg/dL (ref 8.6–10.3)
Calcium: 8.6 mg/dL (ref 8.6–10.3)
Chloride: 107 mmol/L (ref 98–110)
Chloride: 107 mmol/L (ref 98–110)
Creatinine: 0.61 mg/dL (ref 0.60–1.30)
Creatinine: 0.67 mg/dL (ref 0.60–1.30)
EGFR: >90
EGFR: >90
Glucose: 58 mg/dL (ref 70–100)
Glucose: 131 mg/dL (ref 70–100)
Osmolality, Calculated: 287 mOsm/kg (ref 278–305)
Osmolality, Calculated: 289 mOsm/kg (ref 278–305)
Phosphorus: 2.7 mg/dL (ref 2.1–4.7)
Phosphorus: 2.2 mg/dL (ref 2.1–4.7)
Potassium: 3.7 mmol/L (ref 3.5–5.3)
Potassium: 3.7 mmol/L (ref 3.5–5.3)
Sodium: 139 mmol/L (ref 133–146)
Sodium: 138 mmol/L (ref 133–146)

## 2021-04-21 LAB — DIFFERENTIAL
Basophils Absolute: 19 /uL (ref 0–200)
Basophils Relative: 0.9 % (ref 0.0–1.0)
Eosinophils Absolute: 48 /uL (ref 15–500)
Eosinophils Relative: 2.3 % (ref 0.0–8.0)
Lymphocytes Absolute: 636 /uL (ref 850–3900)
Lymphocytes Relative: 30.3 % (ref 15.0–45.0)
Monocytes Absolute: 223 /uL (ref 200–950)
Monocytes Relative: 10.6 % (ref 0.0–12.0)
Neutrophils Absolute: 1174 /uL (ref 1500–7800)
Neutrophils Relative: 55.9 % (ref 40.0–80.0)
nRBC: 0 /100 WBC (ref 0–0)

## 2021-04-21 LAB — HEPATIC FUNCTION PANEL
ALT: 140 U/L (ref 7–52)
AST: 102 U/L (ref 13–39)
Albumin: 2.7 g/dL (ref 3.5–5.7)
Alkaline Phosphatase: 81 U/L (ref 36–125)
Bilirubin, Direct: 4.27 mg/dL (ref 0.00–0.40)
Bilirubin, Indirect: 5.43 mg/dL (ref 0.00–1.10)
Total Bilirubin: 9.7 mg/dL (ref 0.0–1.5)
Total Protein: 5.5 g/dL (ref 6.4–8.9)

## 2021-04-21 LAB — CBC
Hematocrit: 31.1 % (ref 35.0–45.0)
Hemoglobin: 10.6 g/dL (ref 11.7–15.5)
MCH: 40.6 pg (ref 27.0–33.0)
MCHC: 34 g/dL (ref 32.0–36.0)
MCV: 119.3 fL (ref 80.0–100.0)
MPV: 7.9 fL (ref 7.5–11.5)
Platelets: 64 10*3/uL (ref 140–400)
RBC: 2.61 10*6/uL (ref 3.80–5.10)
RDW: 24.6 % (ref 11.0–15.0)
WBC: 2.1 10*3/uL (ref 3.8–10.8)

## 2021-04-21 LAB — TSH: TSH: 0.1 u[IU]/mL (ref 0.45–4.12)

## 2021-04-21 LAB — POC GLU MONITORING DEVICE
POC Glucose Monitoring Device: 119 mg/dL (ref 70–100)
POC Glucose Monitoring Device: 87 mg/dL (ref 70–100)
POC Glucose Monitoring Device: 128 mg/dL (ref 70–100)

## 2021-04-21 LAB — T4, FREE: Free T4: 1.23 ng/dL (ref 0.61–1.76)

## 2021-04-21 LAB — MAGNESIUM
Magnesium: 1.9 mg/dL (ref 1.5–2.5)
Magnesium: 1.9 mg/dL (ref 1.5–2.5)

## 2021-04-21 LAB — PROTIME-INR
INR: 1.7 (ref 0.9–1.1)
Protime: 20.1 seconds (ref 12.1–15.1)

## 2021-04-21 LAB — IGG: IgG: 1594 mg/dL (ref 700.0–1600.0)

## 2021-04-21 LAB — T3: T3, Total: 75.6 ng/dL (ref 60.0–220.0)

## 2021-04-21 MED ORDER — dicyclomine (BENTYL) capsule 10 mg
10 | Freq: Once | ORAL | Status: AC
Start: 2021-04-21 — End: 2021-04-21
  Administered 2021-04-21: 09:00:00 10 mg via ORAL

## 2021-04-21 MED ORDER — potassium chloride (KLOR-CON M20) CR tablet 40 mEq
20 | Freq: Once | ORAL | Status: AC
Start: 2021-04-21 — End: 2021-04-21
  Administered 2021-04-21: 20:00:00 40 meq via ORAL

## 2021-04-21 MED ORDER — blood-glucose meter (FREESTYLE LITE METER) kit
PACK | 0 refills | 30.00000 days | Status: AC
Start: 2021-04-21 — End: 2021-04-23
  Filled 2021-04-22: qty 1, 90d supply, fill #0

## 2021-04-21 MED ORDER — blood sugar diagnostic (FREESTYLE LITE STRIPS) Strp
ORAL_STRIP | 3 refills | 30.00000 days | Status: AC
Start: 2021-04-21 — End: 2021-04-23
  Filled 2021-04-22: qty 50, 50d supply, fill #0

## 2021-04-21 MED ORDER — phytonadione (vitamin K1) (AQUA-MEPHYTON) 2.5 mg in sodium chloride 0.9 % 50 mL IVPB
Freq: Once | INTRAVENOUS | Status: AC
Start: 2021-04-21 — End: 2021-04-21
  Administered 2021-04-21: 21:00:00 2.5 mg via INTRAVENOUS

## 2021-04-21 MED ORDER — tacrolimus (PROGRAF) capsule 2 mg
1 | Freq: Two times a day (BID) | ORAL | Status: AC
Start: 2021-04-21 — End: 2021-04-23
  Administered 2021-04-21 – 2021-04-23 (×5): 2 mg via ORAL

## 2021-04-21 MED ORDER — proCHLORPERazine (COMPAZINE) tablet 5 mg
10 | Freq: Once | ORAL | Status: AC
Start: 2021-04-21 — End: 2021-04-21
  Administered 2021-04-21: 15:00:00 5 mg via ORAL

## 2021-04-21 MED ORDER — furosemide (LASIX) tablet 40 mg
40 | Freq: Once | ORAL | Status: AC
Start: 2021-04-21 — End: 2021-04-21

## 2021-04-21 MED ORDER — diphenhydrAMINE (BENADRYL) capsule 25 mg
25 | Freq: Once | ORAL | Status: AC
Start: 2021-04-21 — End: 2021-04-21
  Administered 2021-04-21: 20:00:00 25 mg via ORAL

## 2021-04-21 MED ORDER — acetaminophen (TYLENOL) tablet 650 mg
325 | Freq: Once | ORAL | Status: AC
Start: 2021-04-21 — End: 2021-04-21
  Administered 2021-04-21: 09:00:00 650 mg via ORAL

## 2021-04-21 MED ORDER — furosemide (LASIX) tablet 40 mg
40 | Freq: Once | ORAL | Status: AC
Start: 2021-04-21 — End: 2021-04-21
  Administered 2021-04-21: 20:00:00 40 mg via ORAL

## 2021-04-21 MED ORDER — dicyclomine (BENTYL) capsule 10 mg
10 | Freq: Four times a day (QID) | ORAL | Status: AC
Start: 2021-04-21 — End: 2021-04-21

## 2021-04-21 MED ORDER — peppermint oiL liquid 1 mL
Freq: Once | Status: AC
Start: 2021-04-21 — End: 2021-04-21
  Administered 2021-04-21: 20:00:00 1 mL via TOPICAL

## 2021-04-21 MED ORDER — magnesium oxide (MAG-OX) tablet 400 mg
400 | Freq: Two times a day (BID) | ORAL | Status: AC
Start: 2021-04-21 — End: 2021-04-23
  Administered 2021-04-22 – 2021-04-23 (×4): 400 mg via ORAL

## 2021-04-21 MED FILL — PREDNISONE 20 MG TABLET: 20 20 MG | ORAL | Qty: 3

## 2021-04-21 MED FILL — XIFAXAN 550 MG TABLET: 550 550 mg | ORAL | Qty: 1

## 2021-04-21 MED FILL — VITAMIN K1 10 MG/ML INJECTION SOLUTION: 10 10 mg/mL | INTRAMUSCULAR | Qty: 0.25

## 2021-04-21 MED FILL — HEPARIN (PORCINE) 5,000 UNIT/ML INJECTION SOLUTION: 5000 5,000 unit/mL | INTRAMUSCULAR | Qty: 1

## 2021-04-21 MED FILL — DICYCLOMINE 10 MG CAPSULE: 10 10 MG | ORAL | Qty: 1

## 2021-04-21 MED FILL — PROCHLORPERAZINE MALEATE 10 MG TABLET: 10 10 MG | ORAL | Qty: 1

## 2021-04-21 MED FILL — FUROSEMIDE 40 MG TABLET: 40 40 MG | ORAL | Qty: 1

## 2021-04-21 MED FILL — TACROLIMUS 1 MG CAPSULE, IMMEDIATE-RELEASE: 1 1 MG | ORAL | Qty: 2

## 2021-04-21 MED FILL — TYLENOL 325 MG TABLET: 325 325 mg | ORAL | Qty: 2

## 2021-04-21 MED FILL — DIPHENHYDRAMINE 25 MG CAPSULE: 25 25 mg | ORAL | Qty: 1

## 2021-04-21 MED FILL — LEVOTHYROXINE 112 MCG TABLET: 112 112 MCG | ORAL | Qty: 1

## 2021-04-21 MED FILL — PANTOPRAZOLE 40 MG TABLET,DELAYED RELEASE: 40 40 MG | ORAL | Qty: 1

## 2021-04-21 MED FILL — URSODIOL 300 MG CAPSULE: 300 300 mg | ORAL | Qty: 2

## 2021-04-21 MED FILL — POTASSIUM CHLORIDE ER 20 MEQ TABLET,EXTENDED RELEASE(PART/CRYST): 20 20 MEQ | ORAL | Qty: 2

## 2021-04-21 MED FILL — PEPPERMINT OIL: 1.00 1.00 mL | Qty: 3.7

## 2021-04-21 NOTE — Unmapped (Signed)
[place]  Case Management/Social Work Department  Progress Note    Patient Information     Hospital day: 1  Inpatient/Observation:  Inpatient   Level of Care:  Floor status  Admit date:  04/20/2021  Admission diagnosis: LIVER FAILURE / HEPATIC ENCEPHALOPATHY    PMH:  has a past medical history of Acne, Amenorrhea, Autoimmune hepatitis (CMS Dx) (03/2016), Bilateral leg edema, Cirrhosis (CMS Dx), Dysmenorrhea, Esophageal varices (CMS Dx) (05/2016), H/O infertility, and PCOS (polycystic ovarian syndrome).    PCP:  Faylene Million, DO    Home Pharmacy:    [facility] - [place] [employer] [place], [place] - [address]  [address]  [address]  [place] [address]  Phone: [phone]     [facility] [place], [place] - [address]  [address] [place] [address]  Phone: [phone]     [facility]  [address]  [place] [address]  Phone: [phone]         Medical Insurance Coverage:  Payor: [phone] TRICARE / Plan: [facility] TRICARE / Product Type: *No Product type* /     Other Pertinent Information     CM has completed a chart review and completed rounds with the team. Per team, the pt is not medically ready for D/C today. Team advised, pt receiving high dose steroids at this time.     Discharge Plan     Anticipated discharge plan:  Home with no needs     Anticipated discharge date:  08/11     CM/SW will continue to follow and remain available for discharge planning needs.      Sol Passer, RN     Cell [phone]

## 2021-04-21 NOTE — Unmapped (Signed)
[facility]  Inpatient Discharge Summary    Patient: Tracy Watkins   MRN: [id number]   CSN: [id number]    Date of Admission: 04/20/2021  Date of Discharge: 04/23/2021   Attending Physician: Lucrezia Europe, MD     Reason for Admission     Tracy Watkins is a 25 y.o. female with PMHx cirrhosis d/b ascites, EV, and jaundice 2/2 autoimmune hepatitis, PCOS, and hypothyroidism??presenting to the hospital with recurrent abdominal pain and encephalopathy.     Hospital Course By Problem     Tracy Watkins is a 25 y.o. female with PMHx cirrhosis??2/2 AIH??d/b ascites, EV, and jaundice 2/2 autoimmune hepatitis, PCOS with IVF cycles x1 (ended 07/22), and hypothyroidism??admitted with altered mental status and recurrent abdominal pain.  ??  #Acute elevation in liver enzymes  #Cirrhosis 2/2 AIH, d/b HE, NBEV, Ascites, Jaundice   Pt recently hospitalized (07/26-08/03) with Acute Liver Injury following the first cycle of IVF medication and egg retrieval (07/22). Pt was diagnosed with AIH in June 2017; follows Dr Boykin Nearing. Was on [facility] transplant list, but removed 2021 d/t stability of dz. Previously treated with Cellcept (dc'd due to pt desire to try IVF) and Azothioprine (dc'd last admission d/t pancytopenia). Prior to June 2022, pt without AIH flares since 2017.     Synthetic dysfunction present, with hypoalbuminemia, elevated INR and thrombocytopenia.  Low suspicion for SBP at this time, given lack of ascitic fluid seen OA with bedside US. Last EGD in 5/22 showed small varices. No evidence of HE on exam though did endorse some confusion at home. Elevated LFTs and Bili. MELD score of 23 on admission. Was previously on imuran but developed pancytopenia. On prednisone 60 mg. Unclear etiology, but likely an AIH flare however lack of response to steroids. Viral hepatitis , CMV,HSV negative. Possibly DILI 2/2 IVF medications vs OHSS. Not encephalopathic and downtrending LFTs and bili by day of discharge.    ??  MELD-Na score: 21 at 04/23/2021  8:36 AM  Calculated from:  Serum Creatinine: 0.82 mg/dL (Using min of 1 mg/dL) at 7/82/9562  1:30 AM  Serum Sodium: 135 mmol/L at 04/23/2021  8:36 AM  Total Bilirubin: 10.3 mg/dL at 8/65/7846  9:62 AM  INR(ratio): 1.5 at 04/23/2021  8:36 AM  Age: 45 years    Recommendations  - follow-up with Dr. Boykin Nearing (Liver) on 8/24  - follow-up liver biopsy results  - Obtain weekly Monday labs (CBC w diff, renal panel, INR, HFTs, tacro troughs) until seeing Dr. Boykin Nearing. Orders placed.   - Continue tacrolimus 2mg  BID  - Continue Prednisone 60mg  and continue on discharge. Per Liver, Dr. Boykin Nearing will taper based on weekly labs (every Monday). Appointment on 8/24.  - Continue Ursodiol, for itching   - Lasix 20mg  daily for edema. Take potassium supplements daily. Stop taking HCTZ.   - Oxy 5 mg 2 day supply for postprocedural pain.   - Low Na Diet  ??  #Hypoglycemia   Endocrinology consulted. Likely etiology is cirrhosis. Low concern for adrenal insufficiency, hypothyroidism, and insulinoma. No symptoms of hypoglycemia.   Recommendations  - Per endocrinology, recommend small frequent meals to prevent low BG. Can take diabetic bars at bedtime to prevent fasting morning low BG.  - Check blood glucose every night before bed  ??  #Hypokalemia    #PCOS s/p recent IVF cycle with egg retrieval  Last IVF Cycle 07/22  Recommendations  - Continue home prenatal vitamin  ??  #Pancytopenia  CBC stable since prior  admission. ANC 1400 (lowest 613). Suspect 2/2 Azothioprine.     ??  #Hypothyroidism  Recommendations  - Continue home Synthroid  - Check TFTs after September 6th. Order placed.   ??  #Asthma  ??    Diagnoses Present on Admission     Past Medical History:   Diagnosis Date   ??? Acne    ??? Amenorrhea    ??? 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 Bilateral leg edema    ??? Cirrhosis (CMS Dx)    ??? Dysmenorrhea    ??? Esophageal varices (CMS Dx) 05/2016    Grade 1/small, no history of bleeding   ??? H/O infertility    ??? PCOS  (polycystic ovarian syndrome)         Discharge Diagnoses     Active Hospital Problems    Diagnosis Date Noted   ??? History of liver biopsy [Z98.890] 04/23/2021   ??? Elevated liver enzymes [R74.8] 04/08/2021     Class: Acute   ??? PCOS (polycystic ovarian syndrome) [E28.2] 01/17/2017   ??? Autoimmune hepatitis (CMS Dx) [K75.4] 12/10/2016   ??? Cirrhosis of liver (CMS Dx) [K74.60] 12/10/2016      Resolved Hospital Problems   No resolved problems to display.       Operations/Procedures Performed (include dates)     Surgeries:  Surgical/Procedural Cases on this Admission     Case IDs Date Procedure Surgeon Location Status    [id number] 04/20/21 LIVER BIOPSY Charise Killian, MD [facility]          Lines/Drains/Airways:  Patient Lines/Drains/Airways Status     Active Line / PIV Line     Name Placement date Placement time Site Days    Peripheral IV 04/22/21 Left Upper arm 04/22/21  1122  Upper arm  1                Notable Imaging Studies:  8/8 Abdominal US    IMPRESSION:     ABDOMEN     1. ??Cirrhotic liver with no focal abnormalities.   2. ??Small volume ascites.   3. ??Splenomegaly.     LIVER DOPPLER     1. ??Hepatic arteries demonstrate normal antegrade flow and resistive indices.   2. ??Recanalized periumbilical vein, consistent with sequela of portal hypertension.     8/8 CXR    IMPRESSION:   Perihilar and bibasilar opacities, likely atelectasis.     Other Procedures:  1. 8/10 Liver biopsy  IMPRESSION:   Successful random ultrasound-guided, percutaneous, 18-G core biopsy of liver.     Consulting Services (include reason)     1. Hepatology for workup of symptoms of worsening cirrhosis  2. IR for liver biopsy    Allergies     Latex  Penicillins  Nsaids (Non-Steroidal Anti-Inflammatory Drug)    Discharge Medications        Medication List      TAKE these medications, which are NEW      Quantity/Refills   blood-glucose meter kit  Commonly known as: FreeStyle Lite Meter  Use to test blood sugar once a day   Quantity: 1 kit  Refills:  0     FreeStyle Lite Strips Strp  Generic drug: blood sugar diagnostic  Use to test blood sugar once a day   Quantity: 100 strip  Refills: 3     furosemide 20 MG tablet  Commonly known as: LASIX  Take 1 tablet (20 mg total) by mouth daily.   Quantity: 60 tablet  Refills:  0     lancets 28 gauge Misc  Commonly known as: FreeStyle Lancets  Use to test blood sugar once a day   Quantity: 100 each  Refills: 0     oxyCODONE 5 MG immediate release tablet  Commonly known as: ROXICODONE  Take 1 tablet (5 mg total) by mouth every 4 hours as needed for up to 2 days.   Quantity: 12 tablet  Refills: 0     potassium chloride 20 MEQ tablet  Commonly known as: KLOR-CON M20  Take 1 tablet (20 mEq total) by mouth daily.   Quantity: 60 tablet  Refills: 0     rifAXIMin 550 mg Tab tablet  Commonly known as: XIFAXAN  Take 1 tablet (550 mg total) by mouth 2 times a day.   Quantity: 60 tablet  Refills: 2     tacrolimus 1 MG capsule  Commonly known as: PROGRAF  Take 2 capsules (2 mg total) by mouth 2 times a day.   Quantity: 120 capsule  Refills: 0        TAKE these medications, which you were ALREADY TAKING      Quantity/Refills   [employer] administrator  Generic drug: inhalational spacing device  Inhale 1 each into the lungs if needed.   Refills: 0     bromocriptine 2.5 mg tablet  Commonly known as: PARLODEL  Take 2.5 mg by mouth daily.   Refills: 0     cholestyramine-aspartame 4 gram Pwpk packet  Commonly known as: PREVALITE  Take 1 packet (4 g total) by mouth 2 times a day for 30 days.   Quantity: 60 packet  Refills: 0     ergocalciferol 1,250 mcg (50,000 unit) capsule  Commonly known as: ERGOCALCIFEROL  Take 50,000 Units by mouth once a week. Patient taking twice a week   Refills: 0     levothyroxine 112 MCG tablet  Commonly known as: SYNTHROID  Take 112 mcg by mouth every morning before breakfast.   Refills: 0     metFORMIN 500 MG 24 hr tablet  Commonly known as: GLUCOPHAGE-XR  2 TAB PO BID WITH MEALS Indications: polycystic  ovarian syndrome   Quantity: 120 tablet  For: polycystic ovarian syndrome  Refills: 4     metoclopramide HCl 10 MG tablet  Commonly known as: REGLAN  Take 10 mg by mouth if needed.   Refills: 0     ondansetron 4 MG disintegrating tablet  Commonly known as: ZOFRAN-ODT  Take 4 mg by mouth every 8 hours as needed.   Refills: 0     pantoprazole 40 MG tablet  Commonly known as: PROTONIX  Take 1 tablet (40 mg total) by mouth daily.   Quantity: 30 tablet  Refills: 0     predniSONE 20 MG tablet  Commonly known as: DELTASONE  Take 3 tablets (60 mg total) by mouth daily for 30 days. Indications: autoimmune hepatitis   Quantity: 90 tablet  For: autoimmune hepatitis  Refills: 0     PRENATAL VITAMIN 1+1 ORAL  Take by mouth.   Refills: 0     ProAir HFA 90 mcg/actuation inhaler  Generic drug: albuterol  Inhale 2 puffs into the lungs every 4 hours as needed.   Refills: 0     ursodioL 300 mg capsule  Commonly known as: ACTIGALL  Take 2 capsules (600 mg total) by mouth 2 times a day for 30 days.   Quantity: 120 capsule  Refills: 0  STOP taking these medications    hydroCHLOROthiazide 25 MG tablet  Commonly known as: HYDRODIURIL           Where to Get Your Medications      These medications were sent to [facility]  [address] [place] [address]    Hours: Sunday - Saturday: 8:00AM - 6:00PM Phone: [phone]   ?? blood-glucose meter kit  ?? FreeStyle Lite Strips Strp  ?? furosemide 20 MG tablet  ?? lancets 28 gauge Misc  ?? oxyCODONE 5 MG immediate release tablet  ?? potassium chloride 20 MEQ tablet  ?? rifAXIMin 550 mg Tab tablet  ?? tacrolimus 1 MG capsule         Condition on Discharge     1. Functional Status: Normal    2. Mental Status: Normal    3. Diet / Tube Feeding / TPN:  Diet Orders  Report         Diet regular starting at 08/10 9147        Regular Diet    4. Respiratory / Lines & Tubes / Wounds:  None required    5. Discharge Physical Exam:    Temp:  [97.2 ??F (36.2 ??C)-97.7 ??F (36.5 ??C)]  97.7 ??F (36.5 ??C)  Heart Rate:  [73-105] 105  Resp:  [16-18] 16  BP: (116-148)/(66-90) 127/76    GENERAL: Alert and cooperative. No acute distress.   EYES: PERRL. Mild scleral icterus.   HEART: Regular rate and rhythm. S1, S2 intact. No murmurs, rubs, gallops. 1+ peripheral edema, more prominent in lateral thighs.   LUNGS: CTAB. Normal work of breathing.  ABDOMEN: Soft, mild generalized RUQ and LUQ abdominal tenderness to palpation, non-distended. Bowel sounds intact. No rebound or guarding.  MSK: No joint swelling. No muscle tenderness.  SKIN: No rashes. No ecchymoses. Mild jaundice present.  NEURO: Speech normal. No facial asymmetry.  PSYCH: Normal mood. Normal behavior.    Intake/Output Summary (Last 24 hours) at 04/23/2021 1516  Last data filed at 04/23/2021 8295  Gross per 24 hour   Intake 140 ml   Output 400 ml   Net -260 ml         Core Measure Documentation (As Applicable)     Most Recent Wt: Weight: (!) 256 lb 13.4 oz (116.5 kg)         Core Measure Documentation  Was the Influenza Vaccine Screening Completed?: No, April-September Discharge  The core measures checklist is complete for discharge?: Yes    Disposition     Home independent    Follow-Up Appointments and Items     Future Appointments   Date Time Provider Department Center   05/06/2021  2:15 PM Tillman Abide, MD [facility]   07/29/2021  3:45 PM Tillman Abide, MD [facility]       No follow-up provider specified.       [facility]  Internal Medicine  04/23/2021, 3:16 PM

## 2021-04-21 NOTE — Unmapped (Signed)
Prior Authorization for Xifaxan was approved. This was sent as a discharge prescription. Medication Discharge Service team informed of approval.    Click on RX Prior Authorization link below for prior auth details.    Talib Headley, CPhT  Transitions of Care  [phone]

## 2021-04-21 NOTE — Unmapped (Signed)
ENDOCRINOLOGY PROGRESS NOTE    04/21/2021  12:52 PM    Tracy Watkins is a 25 y.o. female patient. Hospital Day: 1    Subjective:  No acute events overnight  Possible plan for liver biopsy tomorrow   Had a low BG on renal panel this morning at 58. Patient reports she didn't have symptoms at the time of blood draw but did start feeling shaky which improved with po take  Last meal was at 8pm. Went to bed around 10 pm.   Reports eating ~50% of her meals.       Objective:  Vital signs:  Vitals:    04/21/21 1100   BP: 120/70   Pulse: 94   Resp: 18   Temp: 97.9 ??F (36.6 ??C)   SpO2: 97%     Temp last 24 hours:Temp (24hrs), Avg:98 ??F (36.7 ??C), Min:97.5 ??F (36.4 ??C), Max:98.6 ??F (37 ??C)    Scheduled Meds:  ??? levothyroxine  112 mcg Oral QAM AC   ??? pantoprazole  40 mg Oral Daily 0900   ??? predniSONE  60 mg Oral Daily 0900   ??? rifAXIMin  550 mg Oral BID   ??? tacrolimus  2 mg Oral BID   ??? ursodioL  600 mg Oral BID     Continuous Infusions:  PRN Meds:albuterol        Physical Exam      General appearance: NAD, AAOx3, appears stated age and cooperative  Head: Normocephalic, without obvious abnormality, atraumatic  Eyes: scleral icterus, pupils are symmetric   Abdomen: soft, non-tender; bowel sounds normal;   Extremities: +ve non pitting edema in b/l LE      Labs:  Lab Results   Component Value Date    GLUCOSE 58 (L) 04/21/2021    BUN 16 04/21/2021    CREATININE 0.61 04/21/2021    K 3.7 04/21/2021    NA 139 04/21/2021    CL 107 04/21/2021    CALCIUM 8.8 04/21/2021     Lab Results   Component Value Date    HGBA1C 4.5 (L) 04/16/2020     Lab Results   Component Value Date    WBC 2.1 (L) 04/21/2021    HGB 10.6 (L) 04/21/2021    HCT 31.1 (L) 04/21/2021    MCV 119.3 (H) 04/21/2021    PLT 64 (L) 04/21/2021     Lab Results   Component Value Date    TSH 0.10 (L) 04/21/2021    T3TOTAL 75.6 04/21/2021    FREET4 1.23 04/21/2021     No results found for: TSHRECEPTAB  No results found for: THYROGLB  No results found for: ANTITHYPERAB  Lab Results    Component Value Date    PROLACTIN 28.5 05/26/2017    FSH 6.8 01/25/2020    TESTOSTERONE 118 (H) 04/16/2020    CORTISOL 6.5 07/18/2017     Lab Results   Component Value Date    OSMOLALITY 287 04/21/2021     Lab Results   Component Value Date    LABPROT 7.3 12/16/2020     Lab Results   Component Value Date    CALCIUM 8.8 04/21/2021    PHOS 2.7 04/21/2021      Latest Reference Range & Units 01/25/20 00:00 04/16/20 11:08 04/09/21 05:53 04/21/21 08:42   T3, Total 60.0 - 220.0 ng/dL   811.9 14.7   Free T4 0.61 - 1.76 ng/dL   8.29 5.62   TSH 1.30 - 4.12 uIU/mL 1.80 (E) 2.520 <0.02 (L) 0.10 (L)   (  L): Data is abnormally low  (E): External lab result     Latest Reference Range & Units 04/09/21 05:53 04/21/21 08:42   T3, Total 60.0 - 220.0 ng/dL 161.0 96.0   Free T4 4.54 - 1.76 ng/dL 0.98 1.19   TSH 1.47 - 4.12 uIU/mL <0.02 (L) 0.10 (L)   (L): Data is abnormally low  Assessment:  Tracy Watkins is a 25 y.o. 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 She was recently admitted with similar complaints from 7/26-8/3. Shortly after she received her first cycle of IVF and and egg retrieval on 04/03/21.  She was previously controlled on a stable dose of imuran however developed neutropenia likely due to imuran and was started on Prednisone 60 mg 7/26. Endocrine consulted for evaluation of hypoglycemia, and concerns of hx of hyperprolactinemia as she's on bromocriptine   ??  #Hypoglycemia Evaluation   - Patient does report whipple triad on previous admission with lowest BG 59.   - Lows during morning. No hypoglycemia during current admission  - Likely etiology is cirrhsosis. Not suspecting adrenal insufficiency ( on prednisone 60mg  which is supra-physiological doses of steroids), hypothyroidism, denies excessive etoh intake. .  - recommend small frequent meals, and diabetic bars at bedtime  ??  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   - Follows with reproductive endocrine at [facility] Dr. Tiburcio Pea.   ??  #Hypothyroidism  - reports she was diagnosed earlier this year, but also says its been intermittent. Concerned her abn TFTs are due to drug that cause abnormal thyroid function tests.   - Home dose: levothyroxine 112 mcg daily.   - Repeat TFTs shows improving TSH.   ??  ??  Recommendations:  - Recommend small frequent meals to prevent low BG. Can take diabetic bars or a snack at bedtime to prevent fasting morning low BG.  - Continue to follow with reproductive endocrine for bromocriptine monitoring  - Continue current dose of levothyroxine, repeat TFTs In 4 weeks as outpatient.      We will sign off at this time. Please call if any questions or concerns or significant Hypoglycemia < 50   Patient was discussed with attending Dr. Morene Antu, M.D.  Endocrinology Fellow PGY-5

## 2021-04-21 NOTE — Unmapped (Addendum)
Tracy Watkins,  Here are your hospital discharge instructions:    --> You were hospitalized because you were confused and had abdominal pain. We found that your liver numbers were elevated. We gave you steroids and you felt better and your liver numbers were stable. We performed a liver biopsy to determine the cause. It will take several days for the results of the biopsy to come back.      Please see your liver doctor Darvin Neighbours BARI, MD) on 05/06/21 at 2PM at [facility] at [facility]. Please get blood tests every Monday (8/15 and 8/22) before you see Dr. Boykin Nearing. Please get these blood tests in the morning before you take the tacrolimus medication. Please ensure you 2-3 bowel movements every day.    Since you had low blood sugars at night, please check your blood sugars every night using the glucometer and test strips we have provided for you. Please get blood test for thyroid levels on September 6th.     --> Medication changes:   1) Prednisone. Take 3 tablets by mouth daily.   2) Tacrolimus. Take 2 capsules by mouth 2 times daily.    3) Oxycodone. Take 1 tablet by mouth every 4 hours as needed for pain.   4) Lasix. Take 1 tablet by mouth daily.   5) Potassium Supplement. Take 1 tablet by mouth daily.   6) Stop taking hydrochlorothiazide.    If you notice any fevers, chills, bloody, dark, or black bowel movements, pain with urination, confusion, or severe abdominal pain, please go to the ER or call your primary care physician.    Thank you    Future Appointments   Date Time Provider Department Center   05/06/2021  2:15 PM Tillman Abide, MD [facility]   07/29/2021  3:45 PM Tillman Abide, MD [facility]                 Liver Biopsy, Care After  The following information offers guidance on how to care for yourself after your procedure. Your health care provider may also give you more specific instructions. If you have problems or questions, contact your health care  provider.  What can I expect after the procedure?  After your procedure, it is common to:  Have pain and soreness in the area where the biopsy was done.  Have bruising around the area where the biopsy was done.  Feel tired for 1-2 days.  Follow these instructions at home:  Medicines  Take over-the-counter and prescription medicines only as told by your health care provider.  If you were prescribed an antibiotic medicine, take it as told by your health care provider. Do not stop taking the antibiotic, even if you start to feel better.  Do not take medicines, such as aspirin and ibuprofen, unless your doctor tells you to take them.  Ask your health care provider if the medicine prescribed to you:  Requires you to avoid driving or using machinery.  Can cause constipation. You may need to take these actions to prevent or treat constipation:  Drink enough fluid to keep your urine pale yellow.  Take over-the-counter or prescription medicines.  Eat foods that are high in fiber, such as beans, whole grains, and fresh fruits and vegetables.  Limit foods that are high in fat and processed sugars, such as fried or sweet foods.  Incision care  Follow instructions from your health care provider about how to take care of your  incisions. Make sure you:  Wash your hands with soap and water for at least 20 seconds before and after you change your bandage (dressing). If soap and water are not available, use hand sanitizer.  Change your dressing as told by your health care provider.  Leave stitches (sutures), skin glue, or adhesive strips in place. These skin closures may need to stay in place for 2 weeks or longer. If adhesive strip edges start to loosen and curl up, you may trim the loose edges. Do not remove adhesive strips completely unless your health care provider tells you to do that.  Check your incision areas every day for signs of infection. Check for:  Redness, swelling, or more pain.  Fluid or blood.  Warmth.  Pus or a bad  smell.  Do not take baths, swim, or use a hot tub until your health care provider says it is okay to do so.  Activity  Rest at home for 1-2 days, or as directed by your health care provider.  Avoid sitting for a long time without moving. Get up to take short walks every 1-2 hours. This is important to improve blood flow and breathing. Ask for help if you feel weak or unsteady.  Do not lift anything that is heavier than 10 lb (4.5 kg), or the limit that your health care provider tells you, until he or she says that it is safe.  Do not play contact sports for 2 weeks after the procedure.  Return to your normal activities as told by your health care provider. Ask your health care provider what activities are safe for you.  General instructions    Do not drink alcohol in the first week after the procedure.  Plan to have a responsible adult care for you for the time you are told after you leave the hospital or clinic. This is important.  It is up to you to get the results of your procedure. Ask your health care provider, or the department that is doing the procedure, when your results will be ready.  Keep all follow-up visits. This is important.    Contact a health care provider if:  You have increased bleeding from an incision.  You have redness, swelling, or increasing pain in any incisions.  You notice a discharge or a bad smell coming from any of your incisions.  You develop a rash.  You have a fever or chills.  Get help right away if:  You develop swelling, bloating, or pain in your abdomen.  You become dizzy or faint.  You have nausea or vomiting.  You have shortness of breath or difficulty breathing.  You develop chest pain.  You have problems with your speech or vision.  You have trouble with your balance or moving your arms or legs.  These symptoms may represent a serious problem that is an emergency. Do not wait to see if the symptoms will go away. Get medical help right away. Call your local emergency services  (911 in the [place] Do not drive yourself to the hospital.  Summary  After the liver biopsy, it is common to have pain, soreness, and bruising in the area, as well as tiredness (fatigue).  Take over-the-counter and prescription medicines only as told by your health care provider.  Follow instructions from your health care provider about how to care for your incisions.  Check your incision areas daily for signs of infection.  This information is not intended to replace advice given to  you by your health care provider. Make sure you discuss any questions you have with your health care provider.  Document Revised: 07/14/2020 Document Reviewed: 07/14/2020  Elsevier Patient Education ?? 2022 [employer].

## 2021-04-21 NOTE — Unmapped (Signed)
Clinical Pharmacist Services: Transitions of Care    A prescription for rifaximin (Xifaxan??) was sent to [facility] as authorized by the primary team. The patient's insurance required a prior authorization, which was completed by the PA team and approved. See documentation from RX encounter dated 04/20/21. This medication has a patient copay of $38.00 There were no identified barriers to medication access.    The team was notified; no further transitions of care needs identified at this time.    Thank you,  Nettie Elm, PharmD, BCPS  Clinical Pharmacy Specialist - Internal Medicine  Preferred Contact: Epic Secured Chat  04/21/2021  2:12 PM

## 2021-04-21 NOTE — Unmapped (Signed)
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 Recently admitted with symptoms of dizziness, jaundice started about 1.5 - 2 weeks ago, shortly after the start of starting injection as part of an In vitro fertilization protocol. On arrival at that time, liver enzymes were notable for AlkP of 106, AST/ALT of 623/463 and tbili 13.8, having all been WNL on 12/16/20. Started on Prednisone 40mg  on 7/26 in treatment of AIH flare however this was increased to 60mg  due to lack of response. Imuran was held due to leukopenia. TPMT was checked: 6-TGN was 391, 6-MMPN was 1888.    Interim:  -Feeling better. Abdomen less distended/painful  -LFTs slightly better.    ROS:  10 pt ROS negative unless otherwise noted above    Vitals:  Temp:  [97.5 ??F (36.4 ??C)-98.6 ??F (37 ??C)] 97.9 ??F (36.6 ??C)  Heart Rate:  [85-106] 94  Resp:  [16-18] 18  BP: (112-142)/(59-77) 120/70    Intake/Output Summary (Last 24 hours) at 04/21/2021 1450  Last data filed at 04/20/2021 1535  Gross per 24 hour   Intake 240 ml   Output --   Net 240 ml       Physical Exam   Gen: Well-developed, well nourished. No acute distress.   HEENT: NC/AT, EOMI, moist mucus membranes  CV: RRR, no m/r/g, no LE edema  Lungs: Clear to auscultation bilaterally.   Abdomen: +BS, soft, nontender  Extremities: LE edema  Neuro: A&Ox3, moving all extremities spontaneous, no asterixis   Skin: no bruising/rashes/jaundice  Psych: normal affect    MELD-Na score: 21 at 04/21/2021  1:05 PM  Calculated from:  Serum Creatinine: 0.67 mg/dL (Using min of 1 mg/dL) at 09/18/1094  0:45 PM  Serum Sodium: 138 mmol/L (Using max of 137 mmol/L) at 04/21/2021  1:05 PM  Total Bilirubin: 9.7 mg/dL at 4/0/9811  9:14 AM  INR(ratio): 1.7 at 04/21/2021  8:42 AM  Age: 25 years        Assessment/Plan:   Tracy Watkins??is a 25 y.o.??female with a PMHx of Cirrhosis due to  Autoimmune Hepatitis, PCOS who presents as a direct transfer worsening abdominal discomfort, swelling and difficultly breathing.  ??  #Autoimmune Hepatitis related cirrhosis: Concern for ongoing flare with some improvement on steroids. Symptoms of LE swellling likely worse with prednisone. While her metabolites were not within toxic levels, her Leukopenia does appear to have improved off the Imuran. Given the severity of her disease noted by her cirrhosis and history of AIH flares, she will likely need a second line therapy such as tacrolimus.  -Plan for liver biopsy with IR on Wednesday  -Continue steroids for now  -Start Tacrolimus 2mg  BID   -Check trough on Thursday  -Can give small dose lasix for LE swelling  -Close monitoring of mental status. Currently at baseline. Avoid sedative medications.  -Continue Ursodiol for itching.        Tracy Frederic, MD  Gastroenterology/Hepatology Fellow- PGY 6  Pager [phone]

## 2021-04-21 NOTE — Unmapped (Signed)
Department of Internal Medicine  Daily Progress Note      Chief Complaint / Reason for Follow-up     Tracy Watkins is a 24 y.o. female on hospital day 1 with a PMHx cirrhosis d/b ascites, EV, and jaundice 2/2 autoimmune hepatitis, PCOS, and hypothyroidism presenting to the hospital with recurrent abdominal pain and encephalopathy.     Interval History / Subjective     Patient had a migraine headache last night and received tylenol x1 that slightly helped. Headache again this morning that she received compazine.     Has had several non bloody watery stools today. Last not received the lactulose since yesterday morning     No chest pain, dyspnea, N/V.     ROS: As above    Physical Exam     Temp:  [97.5 ??F (36.4 ??C)-98.6 ??F (37 ??C)] 97.9 ??F (36.6 ??C)  Heart Rate:  [85-106] 94  Resp:  [16-18] 18  BP: (112-142)/(59-77) 120/70    GENERAL: Alert and cooperative. No acute distress.   EYES: PERRL. EOM intact. Mild scleral icterus.   HEART: Regular rate and rhythm. S1, S2 intact. No murmurs, rubs, gallops. 1+ peripheral edema.  LUNGS: Decreased breath sounds in bilateral lung fields. Normal work of breathing.  ABDOMEN: Soft, mild generalized abdominal tenderness to palpation, non-distended. Bowel sounds intact. No rebound or guarding.  MSK: No joint swelling. No muscle tenderness.  SKIN: No rashes. No ecchymoses. Mild jaundice present.  NEURO: Oriented to person, place, and time. Speech normal. No facial asymmetry.  PSYCH: Normal mood. Normal behavior.    Intake/Output Summary (Last 24 hours) at 04/21/2021 1318  Last data filed at 04/20/2021 1535  Gross per 24 hour   Intake 240 ml   Output --   Net 240 ml         Diagnostic Data     All laboratory data was reviewed and relevant values are noted as below.    Na stable  K stable  Cr stable    Albumin 2.7    WBC 2.1  Hgb 10.6  Plts 64    ALP 81   AST 102  ALT 140  T protein 5.5  Indirect bili 5.43  Tbili 9.7  Direct bili 5 > 4.27    Imaging:     8/8 US abdomen   ABDOMEN     1.  ??Cirrhotic liver with no focal abnormalities.   2. ??Small volume ascites.   3. ??Splenomegaly.     LIVER DOPPLER     1. ??Hepatic arteries demonstrate normal antegrade flow and resistive indices.   2. ??Recanalized periumbilical vein, consistent with sequela of portal hypertension.      8/8 CXR  IMPRESSION:   Perihilar and bibasilar opacities, likely atelectasis.       Assessment & Plan     Active Problems:    Autoimmune hepatitis (CMS Dx)    Cirrhosis of liver (CMS Dx)    PCOS (polycystic ovarian syndrome)    Elevated liver enzymes        Tracy Watkins is a 25 y.o. female on HD# 1 with PMHx cirrhosis 2/2 AIH d/b ascites, EV, and jaundice 2/2 autoimmune hepatitis, PCOS with IVF cycles x1 (ended 07/22), and hypothyroidism admitted with altered mental status and recurrent abdominal pain.    #Acute elevation in liver enzymes  #Cirrhosis 2/2 AIH, d/b HE, NBEV, Ascites, Jaundice   Synthetic dysfunction present, with hypoalbuminemia, elevated INR and thrombocytopenia. Unclear etiology, but less likely an AIH flare given  lack of response to steroids. Low suspicion for SBP at this time, given lack of ascitic fluid seen OA with bedside US. Viral hepatitis , CMV,HSV negative. Possibly DILI 2/2 IVF medications vs OHSS. Not encephalopathic on 8/9.   ??  MELD-Na score: 21 at 04/21/2021  8:42 AM  MELD score: 21 at 04/21/2021  8:42 AM  Calculated from:  Serum Creatinine: 0.61 mg/dL (Using min of 1 mg/dL) at 0/05/6044  4:09 AM  Serum Sodium: 139 mmol/L (Using max of 137 mmol/L) at 04/21/2021  8:42 AM  Total Bilirubin: 9.7 mg/dL at 04/13/1913  7:82 AM  INR(ratio): 1.7 at 04/21/2021  8:42 AM  Age: 63 years    Plan  - Liver biopsy by IR on 8/10. NPO at midnight  - Start tacrolimus 2mg  BID and follow-up tacrolimus level on 8/11  - Continue Lactulose, Rifaximin  - Continue Prednisone 60mg   - Continue Ursodiol, for itching  - Low Na Diet    #Hypoglycemia   Endocrinology consulted. Likely etiology is cirrhosis. Low concern for adrenal insufficiency,  hypothyroidism, and insulinoma. No symptoms of hypoglycemia.   Plan  - Per endocrinology, eecommend small frequent meals to prevent low BG. Can take diabetic bars at bedtime to prevent fasting morning low BG.    Lower extremity edema  -40 mg oral lasix x1     #Hypokalemia  - Will continue to monitor    #PCOS s/p recent IVF cycle with egg retrieval  - Last IVF Cycle 07/22  - Continue home prenatal vitamin  ??  #Pancytopenia  - CBC stable since prior admission. ANC 1400 (lowest 613). Suspect 2/2 Azothioprine.   - Transfuse pRBC if Hgb < 7, Plts if <10k  ??  #Hypothyroidism  Low TSH, T3 and Free T4 wnl  - Continue home Synthroid  ??  #Asthma  - Continue Albuterol PRN      Code Status: Full Code  DVT Prophylaxis: SubQ Hep  GI Prophylaxis: Protonix  Nutrition: Low Na  Disposition Plan: Home once medically stable      Treyce Spillers R. Nunzio Banet PGY-1  Internal Medicine  04/21/2021, 1:44 PM

## 2021-04-21 NOTE — Unmapped (Signed)
Patient complaining of intermittent abdominal cramping 7/10 pain since starting lactulose yesterday that has gotten worse overnight. States she had many BMs overnight. MD notified and a dose of bentyl given. Patient also complained of a migraine, states she gets them often. MD notified and a dose of tylenol given. Will continue to monitor.

## 2021-04-21 NOTE — Unmapped (Signed)
Problem: Pain  Goal: Patient's pain is progressing toward patient's stated pain goal  Description: Assess and monitor patient's pain using appropriate pain scale. Collaborate with interdisciplinary team and initiate plan and interventions as ordered. Re-assess patient's pain level 30 - 60 minutes after pain management intervention.   Outcome: Progressing     Problem: Fall Prevention  Goal: Patient will remain free of falls  Description: Assess and monitor vitals signs, neurological status including level of consciousness and orientation.  Reassess fall risk per hospital policy.    Ensure arm band on, uncluttered walking paths in room, adequate room lighting, call light and overbed table within reach, bed in low position, wheels locked, side rails up per policy (excluding SNF), and non-skid footwear provided.   Outcome: Progressing     Problem: Potential for Compromised Skin Integrity  Goal: Skin integrity is maintained or improved  Description: Assess and monitor skin integrity. Identify patients at risk for skin breakdown on admission and per policy. Collaborate with interdisciplinary team and initiate plans and interventions as needed.  Outcome: Progressing

## 2021-04-22 ENCOUNTER — Inpatient Hospital Stay: Admit: 2021-04-22 | Payer: TRICARE (CHAMPUS) | Attending: Vascular & Interventional Radiology

## 2021-04-22 LAB — HEPATIC FUNCTION PANEL
ALT: 119 U/L — ABNORMAL HIGH (ref 7–52)
AST: 69 U/L — ABNORMAL HIGH (ref 13–39)
Albumin: 2.4 g/dL — ABNORMAL LOW (ref 3.5–5.7)
Alkaline Phosphatase: 86 U/L (ref 36–125)
Bilirubin, Direct: 4.53 mg/dL — ABNORMAL HIGH (ref 0.00–0.40)
Bilirubin, Indirect: 3.87 mg/dL — ABNORMAL HIGH (ref 0.00–1.10)
Total Bilirubin: 8.4 mg/dL — ABNORMAL HIGH (ref 0.0–1.5)
Total Protein: 5.1 g/dL — ABNORMAL LOW (ref 6.4–8.9)

## 2021-04-22 LAB — CBC
Hematocrit: 28 % — ABNORMAL LOW (ref 35.0–45.0)
Hemoglobin: 9.9 g/dL — ABNORMAL LOW (ref 11.7–15.5)
MCH: 41.5 pg — ABNORMAL HIGH (ref 27.0–33.0)
MCHC: 35.2 g/dL (ref 32.0–36.0)
MCV: 117.8 fL — ABNORMAL HIGH (ref 80.0–100.0)
MPV: 7.5 fL (ref 7.5–11.5)
Platelet Estimate: DECREASED
Platelets: 66 10E3/uL — ABNORMAL LOW (ref 140–400)
RBC: 2.37 10E6/uL — ABNORMAL LOW (ref 3.80–5.10)
RDW: 23.5 % — ABNORMAL HIGH (ref 11.0–15.0)
WBC: 1.6 10E3/uL — ABNORMAL LOW (ref 3.8–10.8)

## 2021-04-22 LAB — RENAL FUNCTION PANEL W/EGFR
Albumin: 2.4 g/dL — ABNORMAL LOW (ref 3.5–5.7)
Albumin: 2.8 g/dL (ref 3.5–5.7)
Anion Gap: 6 mmol/L (ref 3–16)
Anion Gap: 6 mmol/L (ref 3–16)
BUN: 16 mg/dL (ref 7–25)
BUN: 18 mg/dL (ref 7–25)
CO2: 25 mmol/L (ref 21–33)
CO2: 26 mmol/L (ref 21–33)
Calcium: 7.6 mg/dL — ABNORMAL LOW (ref 8.6–10.3)
Calcium: 7.9 mg/dL (ref 8.6–10.3)
Chloride: 109 mmol/L (ref 98–110)
Chloride: 108 mmol/L (ref 98–110)
Creatinine: 0.56 mg/dL — ABNORMAL LOW (ref 0.60–1.30)
Creatinine: 0.74 mg/dL (ref 0.60–1.30)
EGFR: >90
EGFR: >90
Glucose: 78 mg/dL (ref 70–100)
Glucose: 151 mg/dL (ref 70–100)
Osmolality, Calculated: 290 mosm/kg (ref 278–305)
Osmolality, Calculated: 295 mOsm/kg (ref 278–305)
Phosphorus: 2.7 mg/dL (ref 2.1–4.7)
Phosphorus: 2.8 mg/dL (ref 2.1–4.7)
Potassium: 3.5 mmol/L (ref 3.5–5.3)
Potassium: 4 mmol/L (ref 3.5–5.3)
Sodium: 140 mmol/L (ref 133–146)
Sodium: 140 mmol/L (ref 133–146)

## 2021-04-22 LAB — MAGNESIUM
Magnesium: 2 mg/dL (ref 1.5–2.5)
Magnesium: 2 mg/dL (ref 1.5–2.5)

## 2021-04-22 LAB — POC GLU MONITORING DEVICE
POC Glucose Monitoring Device: 137 mg/dL (ref 70–100)
POC Glucose Monitoring Device: 96 mg/dL (ref 70–100)
POC Glucose Monitoring Device: 75 mg/dL (ref 70–100)
POC Glucose Monitoring Device: 175 mg/dL (ref 70–100)

## 2021-04-22 LAB — PROTIME-INR
INR: 1.7 — ABNORMAL HIGH (ref 0.9–1.1)
Protime: 20.9 s — ABNORMAL HIGH (ref 12.1–15.1)

## 2021-04-22 MED ORDER — midazolam (PF) (VERSED) 1 mg/mL injection
1 | INTRAMUSCULAR | Status: AC
Start: 2021-04-22 — End: 2021-04-22

## 2021-04-22 MED ORDER — fentaNYLSUBLIMAZE50mcgmLinjection
50 | INTRAMUSCULAR | Status: AC
Start: 2021-04-22 — End: ?

## 2021-04-22 MED ORDER — tacrolimus (PROGRAF) 1 MG capsule
1 | ORAL_CAPSULE | Freq: Two times a day (BID) | ORAL | 0 refills | Status: AC
Start: 2021-04-22 — End: 2021-04-23
  Filled 2021-04-22: qty 120, 30d supply, fill #0

## 2021-04-22 MED ORDER — furosemide (LASIX) tablet 40 mg
40 | Freq: Once | ORAL | Status: AC
Start: 2021-04-22 — End: 2021-04-22
  Administered 2021-04-22: 18:00:00 40 mg via ORAL

## 2021-04-22 MED ORDER — potassium chloride (KLOR-CON M20) CR tablet 40 mEq
20 | Freq: Once | ORAL | Status: AC
Start: 2021-04-22 — End: 2021-04-22
  Administered 2021-04-22: 13:00:00 40 meq via ORAL

## 2021-04-22 MED ORDER — oxyCODONE (ROXICODONE) immediate release tablet 5 mg
5 | ORAL | Status: AC | PRN
Start: 2021-04-22 — End: 2021-04-23
  Administered 2021-04-22 – 2021-04-23 (×6): 5 mg via ORAL

## 2021-04-22 MED ORDER — HYDROmorphone (DILAUDID) injection Syrg 0.5 mg
0.5 | Freq: Once | INTRAMUSCULAR | Status: AC
Start: 2021-04-22 — End: 2021-04-22
  Administered 2021-04-22: 22:00:00 0.5 mg via INTRAVENOUS

## 2021-04-22 MED ORDER — ondansetron (ZOFRAN) injection 4 mg
4 | Freq: Three times a day (TID) | INTRAMUSCULAR | Status: AC | PRN
Start: 2021-04-22 — End: 2021-04-23

## 2021-04-22 MED ORDER — fentaNYL (SUBLIMAZE) injection
50 | INTRAMUSCULAR | Status: AC | PRN
Start: 2021-04-22 — End: 2021-04-22
  Administered 2021-04-22: 13:00:00 50 via INTRAVENOUS

## 2021-04-22 MED ORDER — potassium chloride (KLOR-CON M20) CR tablet 20 mEq
20 | Freq: Once | ORAL | Status: AC
Start: 2021-04-22 — End: 2021-04-22
  Administered 2021-04-22: 18:00:00 20 meq via ORAL

## 2021-04-22 MED ORDER — proMETHazine (PHENERGAN) injection 12.5 mg
25 | INTRAMUSCULAR | Status: AC | PRN
Start: 2021-04-22 — End: 2021-04-23
  Administered 2021-04-23 (×3): 12.5 mg via INTRAVENOUS

## 2021-04-22 MED ORDER — fentaNYL (SUBLIMAZE) injection
50 | INTRAMUSCULAR | Status: AC | PRN
Start: 2021-04-22 — End: 2021-04-22
  Administered 2021-04-22: 13:00:00 25 via INTRAVENOUS

## 2021-04-22 MED ORDER — flumazeniL (ROMAZICON) 0.1 mg/mL injection
0.1 | INTRAVENOUS | Status: AC
Start: 2021-04-22 — End: 2021-04-22

## 2021-04-22 MED ORDER — furosemide (LASIX) tablet 40 mg
40 | Freq: Every day | ORAL | Status: AC
Start: 2021-04-22 — End: 2021-04-22

## 2021-04-22 MED ORDER — oxyCODONE (ROXICODONE) 5 MG immediate release tablet
5 | ORAL | Status: AC
Start: 2021-04-22 — End: ?

## 2021-04-22 MED ORDER — sodium chloride flush 10 mL
INTRAMUSCULAR | Status: AC
Start: 2021-04-22 — End: 2021-04-23
  Administered 2021-04-22 – 2021-04-23 (×4): 10 mL via INTRAVENOUS

## 2021-04-22 MED ORDER — midazolam (PF) (VERSED) injection
1 | INTRAMUSCULAR | Status: AC | PRN
Start: 2021-04-22 — End: 2021-04-22
  Administered 2021-04-22: 13:00:00 1 via INTRAVENOUS

## 2021-04-22 MED ORDER — naloxone (NARCAN) 0.4 mg/mL injection
0.4 | INTRAMUSCULAR | Status: AC
Start: 2021-04-22 — End: 2021-04-22

## 2021-04-22 MED ORDER — acetaminophen (TYLENOL) tablet 650 mg
325 | Freq: Three times a day (TID) | ORAL | Status: AC | PRN
Start: 2021-04-22 — End: 2021-04-23
  Administered 2021-04-22: 20:00:00 650 mg via ORAL

## 2021-04-22 MED ORDER — fentaNYL (SUBLIMAZE) 50 mcg/mL injection
50 | INTRAMUSCULAR | Status: AC
Start: 2021-04-22 — End: 2021-04-22

## 2021-04-22 MED ORDER — lidocaine 10 mg/mL (1 %) injection
10 | INTRAMUSCULAR | Status: AC | PRN
Start: 2021-04-22 — End: 2021-04-22
  Administered 2021-04-22: 13:00:00 10 via SUBCUTANEOUS

## 2021-04-22 MED ORDER — spironolactone (ALDACTONE) tablet 25 mg
25 | Freq: Every day | ORAL | Status: AC
Start: 2021-04-22 — End: 2021-04-22
  Administered 2021-04-22: 14:00:00 25 mg via ORAL

## 2021-04-22 MED ORDER — fentaNYL (SUBLIMAZE) 50 mcg/mL injection
50 | INTRAMUSCULAR | Status: AC
Start: 2021-04-22 — End: 2021-04-22
  Administered 2021-04-22: 15:00:00 50

## 2021-04-22 MED ORDER — HYDROmorphone (DILAUDID) injection Syrg 0.5 mg
0.5 | Freq: Once | INTRAMUSCULAR | Status: AC
Start: 2021-04-22 — End: 2021-04-22
  Administered 2021-04-22: 16:00:00 0.5 mg via INTRAVENOUS

## 2021-04-22 MED ORDER — enoxaparin (LOVENOX) for prophylaxis syringe 40 mg/0.4 mL
40 | Freq: Every day | SUBCUTANEOUS | Status: AC
Start: 2021-04-22 — End: 2021-04-23
  Administered 2021-04-22 – 2021-04-23 (×2): 40 mg via SUBCUTANEOUS

## 2021-04-22 MED ORDER — lancets (FREESTYLE LANCETS) 28 gauge Misc
28 | 0 refills | Status: AC
Start: 2021-04-22 — End: ?
  Filled 2021-04-22: qty 100, 100d supply, fill #0

## 2021-04-22 MED FILL — TYLENOL 325 MG TABLET: 325 325 mg | ORAL | Qty: 2

## 2021-04-22 MED FILL — OXYCODONE 5 MG TABLET: 5 5 MG | ORAL | Qty: 1

## 2021-04-22 MED FILL — FLUMAZENIL 0.1 MG/ML INTRAVENOUS SOLUTION: 0.1 0.1 mg/mL | INTRAVENOUS | Qty: 5

## 2021-04-22 MED FILL — TACROLIMUS 1 MG CAPSULE, IMMEDIATE-RELEASE: 1 1 MG | ORAL | Qty: 2

## 2021-04-22 MED FILL — URSODIOL 300 MG CAPSULE: 300 300 mg | ORAL | Qty: 2

## 2021-04-22 MED FILL — MAGNESIUM OXIDE 400 MG (241.3 MG MAGNESIUM) TABLET: 400 400 mg | ORAL | Qty: 1

## 2021-04-22 MED FILL — POTASSIUM CHLORIDE ER 20 MEQ TABLET,EXTENDED RELEASE(PART/CRYST): 20 20 MEQ | ORAL | Qty: 1

## 2021-04-22 MED FILL — FENTANYL (PF) 50 MCG/ML INJECTION SOLUTION: 50 50 mcg/mL | INTRAMUSCULAR | Qty: 2

## 2021-04-22 MED FILL — XIFAXAN 550 MG TABLET: 550 550 mg | ORAL | Qty: 1

## 2021-04-22 MED FILL — HYDROMORPHONE 0.5 MG/0.5 ML INJECTION SYRINGE: 0.5 0.5 mg/0.5 mL | INTRAMUSCULAR | Qty: 0.5

## 2021-04-22 MED FILL — PREDNISONE 20 MG TABLET: 20 20 MG | ORAL | Qty: 3

## 2021-04-22 MED FILL — PROMETHAZINE 25 MG/ML INJECTION SOLUTION: 25 25 mg/mL | INTRAMUSCULAR | Qty: 1

## 2021-04-22 MED FILL — PANTOPRAZOLE 40 MG TABLET,DELAYED RELEASE: 40 40 MG | ORAL | Qty: 1

## 2021-04-22 MED FILL — FUROSEMIDE 40 MG TABLET: 40 40 MG | ORAL | Qty: 1

## 2021-04-22 MED FILL — POTASSIUM CHLORIDE ER 20 MEQ TABLET,EXTENDED RELEASE(PART/CRYST): 20 20 MEQ | ORAL | Qty: 2

## 2021-04-22 MED FILL — SPIRONOLACTONE 25 MG TABLET: 25 25 MG | ORAL | Qty: 1

## 2021-04-22 MED FILL — ENOXAPARIN 40 MG/0.4 ML SUBCUTANEOUS SYRINGE: 40 40 mg/0.4 mL | SUBCUTANEOUS | Qty: 0.4

## 2021-04-22 MED FILL — NALOXONE 0.4 MG/ML INJECTION SOLUTION: 0.4 0.4 mg/mL | INTRAMUSCULAR | Qty: 1

## 2021-04-22 MED FILL — LEVOTHYROXINE 112 MCG TABLET: 112 112 MCG | ORAL | Qty: 1

## 2021-04-22 MED FILL — MIDAZOLAM (PF) 1 MG/ML INJECTION SOLUTION: 1 1 mg/mL | INTRAMUSCULAR | Qty: 2

## 2021-04-22 NOTE — Unmapped (Signed)
Vascular & Interventional Radiology Brief Operative Note     Date: 04/22/2021  Patient: Tracy Watkins  DOB: Dec 16, 1995  MRN: [id number]    IR Procedure(s) Performed:   US guided (nontargeted) liver biopsy.    Operators:  Mingo Amber, MD, radiology resident  Gentry Roch, MD    Pre-operative diagnosis:   Cirrhosis    Post-operative diagnosis:  Same    Intra-procedural Medications:    Medications   Medication Event Details Admin User Admin Time   potassium chloride (KLOR-CON M20) CR tablet 40 mEq Medication Hold Dose: 0 mEq; Route: Oral; Reason: Patient/family refused; Scheduled Time:  9:00 AM Rutha Bouchard, RN 04/22/2021  8:37 AM   fentaNYL (SUBLIMAZE) injection Medication Ordered and Given Dose: 50 mcg; Route: Intravenous; Ordered by: Gentry Roch, MD Glenetta Hew, RN 04/22/2021  8:52 AM   midazolam (PF) (VERSED) injection Medication Ordered and Given Dose: 1 mg; Route: Intravenous; Ordered by: Gentry Roch, MD Glenetta Hew, RN 04/22/2021  8:52 AM   midazolam (PF) (VERSED) injection Medication Ordered and Given Dose: 1 mg; Route: Intravenous; Ordered by: Gentry Roch, MD Glenetta Hew, RN 04/22/2021  9:00 AM   lidocaine 10 mg/mL (1 %) injection Medication Ordered and Given Dose: 10 mL; Route: Subcutaneous; Site: Abdominal Tissue; Ordered by: Gentry Roch, MD Mingo Amber, MD 04/22/2021  9:05 AM   fentaNYL (SUBLIMAZE) injection Medication Ordered and Given Dose: 25 mcg; Route: Intravenous; Ordered by: Gentry Roch, MD Glenetta Hew, RN 04/22/2021  9:07 AM   fentaNYL (SUBLIMAZE) injection Medication Ordered and Given Dose: 50 mcg; Route: Intravenous; Ordered by: Gentry Roch, MD Glenetta Hew, RN 04/22/2021  9:11 AM       Findings:  Echogenic liver with nodular contour.   No evidence of post procedural hematoma.     Specimens removed:  Core biopsy x3, sent to pathology.    Estimated Blood Loss:  Minimal (less than 15mL)    Complications:  None    Access site(s):  Right upper quadrant.    Post procedure  care/monitoring:    Monitor signs and symptoms of bleeding for two hours.  Routine sedation recovery.    Recommendations/Follow-up:  Follow up pathology results.    Please refer to full dictated Interventional Radiology report for details of findings/procedures, which can be located in [facility] Procedures (or [facility] Imaging) Tabs.      Please call with any questions.      Mingo Amber, MD  Vascular & Interventional Radiology  04/22/2021,9:22 AM  [facility]: [phone])

## 2021-04-22 NOTE — Unmapped (Signed)
Department of Internal Medicine  Daily Progress Note      Chief Complaint / Reason for Follow-up     Ivett Luebbe is a 25 y.o. female on hospital day 2 with a PMHx cirrhosis d/b ascites, EV, and jaundice 2/2 autoimmune hepatitis, PCOS, and hypothyroidism presenting to the hospital with recurrent abdominal pain and encephalopathy.     Interval History / Subjective     Did not report significant UOP on lasix spot dose. Feels like LE edema about the same.     Peppermint oil and benadryl helped headache. No headache this morning.     Denies fevers, chest pain, SOB, N/V, or pain elsewhere.    Reports >3 formed non-bloody stools.     No hypoglycemic episodes.     Significant abdominal pain after IR procedure. Hemodynamically stable without rigid abdomen or rebound tenderness.     ROS: As above    Physical Exam     Temp:  [97.7 ??F (36.5 ??C)-98.2 ??F (36.8 ??C)] 97.7 ??F (36.5 ??C)  Heart Rate:  [83-123] 101  Resp:  [14-24] 20  BP: (105-129)/(52-72) 106/56    GENERAL: Alert and cooperative. No acute distress.   EYES: PERRL. Mild scleral icterus.   HEART: Regular rate and rhythm. S1, S2 intact. No murmurs, rubs, gallops. 1+ peripheral edema.  LUNGS: CTAB. Normal work of breathing.  ABDOMEN: Soft, generalized RUQ and LUQ abdominal tenderness to palpation, non-distended. Bowel sounds intact. No rebound or guarding.  MSK: No joint swelling. No muscle tenderness.  SKIN: No rashes. No ecchymoses. Mild jaundice present.  NEURO: Speech normal. No facial asymmetry.  PSYCH: Normal mood. Normal behavior.  No intake or output data in the 24 hours ending 04/22/21 1142      Diagnostic Data     All laboratory data was reviewed and relevant values are noted as below.    Na stable  K stable  Cr stable    Albumin 2.4    WBC 2.1  Hgb 10.6  Plts 64    ALP Stable  AST 102 > 69  ALT 140 > 119  T protein 5.5 > 5.1  Indirect bili 5.43 > 3.87  Tbili 9.7 > 8.4  Direct bili 5 > 4.27 > 4.53    Imaging:     8/8 US abdomen   ABDOMEN     1. ??Cirrhotic  liver with no focal abnormalities.   2. ??Small volume ascites.   3. ??Splenomegaly.     LIVER DOPPLER     1. ??Hepatic arteries demonstrate normal antegrade flow and resistive indices.   2. ??Recanalized periumbilical vein, consistent with sequela of portal hypertension.      8/8 CXR  IMPRESSION:   Perihilar and bibasilar opacities, likely atelectasis.       Assessment & Plan     Active Problems:    Autoimmune hepatitis (CMS Dx)    Cirrhosis of liver (CMS Dx)    PCOS (polycystic ovarian syndrome)    Elevated liver enzymes        Deshawna Mcneece is a 25 y.o. female on HD# 2 with PMHx cirrhosis 2/2 AIH d/b ascites, EV, and jaundice 2/2 autoimmune hepatitis, PCOS with IVF cycles x1 (ended 07/22), and hypothyroidism admitted with altered mental status and recurrent abdominal pain.    #Acute elevation in liver enzymes  #Decompensated Cirrhosis 2/2 AIH, d/b HE, NBEV, Ascites, Jaundice   Synthetic dysfunction present, with hypoalbuminemia, elevated INR and thrombocytopenia. Unclear etiology, but likely an AIH flare however lack of response  on steroids. Low suspicion for SBP at this time, given lack of ascitic fluid seen OA with bedside US. Viral hepatitis , CMV,HSV negative. Possibly DILI 2/2 IVF medications vs OHSS. No longer encephalopathic. LFTs and bilirubin stable and improving.   ??  MELD-Na score: 20 at 04/22/2021  7:13 AM  MELD score: 20 at 04/22/2021  7:13 AM  Calculated from:  Serum Creatinine: 0.56 mg/dL (Using min of 1 mg/dL) at 4/54/0981  1:91 AM  Serum Sodium: 140 mmol/L (Using max of 137 mmol/L) at 04/22/2021  7:13 AM  Total Bilirubin: 8.4 mg/dL at 4/78/2956  2:13 AM  INR(ratio): 1.7 at 04/22/2021  7:13 AM  Age: 21 years    Plan  - follow-up liver biopsy results  - Continue tacrolimus 2mg  BID and follow-up tacrolimus level on 8/11 30 mins before AM dose.   - Continue Rifaximin and titrate lactulose to 3 BMs a day  - Continue Prednisone 60mg  and continue on discharge. Per Liver, Dr. Boykin Nearing will taper based on weekly  labs (every Monday). Appointment on 8/24.  - Continue Ursodiol, for itching   - Lasix prn for edema.  - Low Na Diet    #Hypoglycemia   Endocrinology consulted. Likely etiology is cirrhosis. Low concern for adrenal insufficiency, hypothyroidism, and insulinoma. No symptoms of hypoglycemia.   Plan  - Per endocrinology, recommend small frequent meals to prevent low BG. Can take diabetic bars at bedtime to prevent fasting morning low BG.    Lower extremity edema  -40 mg oral lasix x1 8/9. Do not plan on restarting home HCTZ on discharge. Lasix prn for edema. Edema will likely improve as prednisone tapers outpatient.         #Hypokalemia  - Will continue to monitor    #PCOS s/p recent IVF cycle with egg retrieval  - Last IVF Cycle 07/22  - Continue home prenatal vitamin  ??  #Pancytopenia  - CBC stable since prior admission. ANC 1400 (lowest 613). Suspect 2/2 Azothioprine.   - Transfuse pRBC if Hgb < 7, Plts if <10k  ??  #Hypothyroidism  Low TSH, T3 and Free T4 wnl  - Continue home Synthroid  ??  #Asthma  - Continue Albuterol PRN      Code Status: Full Code  DVT Prophylaxis: SubQ Hep (restarted)  GI Prophylaxis: Protonix  Nutrition: Low Na  Disposition Plan: Home once medically stable      Hiba Garry R. Cyleigh Massaro PGY-1  Internal Medicine  04/22/2021, 11:42 AM

## 2021-04-22 NOTE — Unmapped (Signed)
Patient currently in IR recovery, RElrod, RN, assuming care and requested that writer assist with new PIV insertion, stated right ac PIV is painful with flushing and leaking.   Upon visual assessment, bilateral upper extremities with several areas of bruising and old removed piv sites in distal areas.   Using ultrasound, assessed bilateral upper extremities, no vessels noted viable for PIV insertion, most vessels were noncompressible, not circular in shape, multiple veins with shadiness noted on ultrasound.   Dr. Margarita Mail at bedside to evaluate patient's pain, updated physician on ultrasound assessment, would recommend vascular access team consult if further PIV continue to fail, patient may require an alternate venous access device. Writer provided USPIV in left cephalic with out difficulty. Patient tolerated well.

## 2021-04-22 NOTE — Unmapped (Signed)
[facility]  Case Management/Social Work Department  Progress Note    Patient Information     Hospital day: 2  Inpatient/Observation:  Inpatient   Level of Care:  GI   Admit date:  04/20/2021  Admission diagnosis: LIVER FAILURE / HEPATIC ENCEPHALOPATHY    PMH:  has a past medical history of Acne, Amenorrhea, Autoimmune hepatitis (CMS Dx) (03/2016), Bilateral leg edema, Cirrhosis (CMS Dx), Dysmenorrhea, Esophageal varices (CMS Dx) (05/2016), H/O infertility, and PCOS (polycystic ovarian syndrome).    PCP:  Faylene Million, DO    Home Pharmacy:    [facility] [address] [facility] [facility] [place] [employer], [place] - [address]  [address]  [address]  [place] [address]  Phone: [phone]     [facility] [place], [place] - [address] [place] [address]  Phone: [phone]     [facility]  [address]  [place] [address]  Phone: [phone]         Medical Insurance Coverage:  Payor: [phone] TRICARE / Plan: [facility] MILITARY TRICARE / Product Type: *No Product type* /     Other Pertinent Information     Patient discussed today during multidisciplinary rounds with GI team. Per team, patient is not medically ready for discharge pending pain control and plan for additional work-up and testing. Team advised of need for biopsy today - results pending.     No discharge needs have been identified at this time per GI Team or SW chart review. CM staff will continue to follow patient and assist with discharge needs as they arise.       Discharge Plan     Anticipated discharge plan:  Home      Anticipated discharge date:  8/12     CM/SW will continue to follow and remain available for discharge planning needs.      Luisa Dago, MSW, LSW     Cell [phone]

## 2021-04-22 NOTE — Unmapped (Signed)
Dr.Nahab notified of patient's increasing pain; pt grimacing and crying. Fentanyl 50mg  ordered. Tracy Watkins

## 2021-04-22 NOTE — Unmapped (Signed)
Patient brought back from room 5. Patient is A&O X4. Assessment as documented, Vital signs as documented. Patient tolerates PO intake. No apparent sedation related complications. Patient has indicated a level of pain as 6-7 on 0-10 scale. No bleeding, tenderness, or hematoma at the procedural site. Site dressing is clean, dry, occlusive, and intact. Pt oriented to room and given call light. Bed in lowest position and locked.  Procedural site checks performed per policy. Tracy Watkins

## 2021-04-22 NOTE — Unmapped (Signed)
Interventional Radiology Consult Note       Date: 04/22/2021  Patient: Tracy Watkins  DOB: [birth year]  MRN: [id number]    Primary Care Provider: Faylene Million, DO  Requesting Provider: Clayborn Heron, MD  Chief Complaint: Acute liver injury  Reason for Consult: liver biopsy  IR Procedure Request Received and Reviewed.    HPI:   Tracy Watkins is a 25 y.o. female with PMHx cirrhosis??2/2 AIH??d/b ascites, EV, and jaundice 2/2 autoimmune hepatitis, PCOS with IVF cycles x1 (ended 07/22), and hypothyroidism??admitted with bilateral pleural effusions, altered mental status and recurrent abdominal pain. LFTs elevated, worsening. IR consulted for percutaneous liver biopsy.     History:   Past Medical History:   Diagnosis Date   ??? Acne    ??? Amenorrhea    ??? 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 Bilateral leg edema    ??? Cirrhosis (CMS Dx)    ??? Dysmenorrhea    ??? Esophageal varices (CMS Dx) 05/2016    Grade 1/small, no history of bleeding   ??? H/O infertility    ??? PCOS (polycystic ovarian syndrome)      Past Surgical History:   Procedure Laterality Date   ??? ESOPHAGOGASTRODUODENOSCOPY N/A 02/05/2021    Procedure: ESOPHAGOGASTRODUODENOSCOPY WITH MAC;  Surgeon: Wallene Dales, MD;  Location[facility] ENDOSCOPY;  Service: Gastroenterology;  Laterality: N/A;   ??? ESOPHAGOGASTRODUODENOSCOPY N/A 02/05/2021    Procedure: EGD WITH BIOPSY;  Surgeon: Wallene Dales, MD;  Location: [facility] ENDOSCOPY;  Service: Gastroenterology;  Laterality: N/A;   ??? LIVER BIOPSY     ??? UPPER GASTROINTESTINAL ENDOSCOPY       Family History   Problem Relation Age of Onset   ??? Hearing loss Mother    ??? No Known Problems Father    ??? Pompe disease Maternal Grandfather    ??? Hearing loss Brother    ??? Hearing loss Maternal Grandmother    ??? Anesthesia problems Neg Hx        Social Hx:  Social History     Tobacco Use   Smoking Status Never Smoker   Smokeless Tobacco Never Used     Social History     Substance and Sexual Activity   Drug Use No     Social History     Substance and  Sexual Activity   Alcohol Use No       Allergies:  Allergies   Allergen Reactions   ??? Latex Rash     Other reaction(s): Rash, urticarial  hives  hives     ??? Penicillins Rash   ??? 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        Home Medications:  Prior to Admission medications    Medication   rifAXIMin (XIFAXAN) 550 mg Tab tablet   ACE AEROSOL CLOUD ENHANCER Spcr   albuterol (PROAIR HFA) 90 mcg/actuation Inhl inhaler   blood sugar diagnostic (FREESTYLE LITE STRIPS) Strp   blood-glucose meter (FREESTYLE LITE METER) kit   bromocriptine (PARLODEL) 2.5 mg tablet   cholestyramine-aspartame (PREVALITE) 4 gram PwPk packet   ergocalciferol (ERGOCALCIFEROL) 1,250 mcg (50,000 unit) capsule   hydroCHLOROthiazide (HYDRODIURIL) 25 MG tablet   levothyroxine (SYNTHROID) 112 MCG tablet   metFORMIN (GLUCOPHAGE-XR) 500 MG 24 hr tablet   metoclopramide HCl (REGLAN) 10 MG tablet   ondansetron (ZOFRAN-ODT) 4 MG disintegrating tablet   pantoprazole (PROTONIX) 40 MG tablet   predniSONE (DELTASONE) 20 MG tablet   prenatal vit,cal 74/iron/folic (PRENATAL  VITAMIN 1+1 ORAL)   ursodioL (ACTIGALL) 300 mg capsule       Current Medications:     Scheduled Medications:    levothyroxine, 112 mcg, QAM AC  magnesium oxide, 400 mg, BID  pantoprazole, 40 mg, Daily 0900  predniSONE, 60 mg, Daily 0900  rifAXIMin, 550 mg, BID  tacrolimus, 2 mg, BID  ursodioL, 600 mg, BID        IV Meds:       PRN Medications:  albuterol, 2.5 mg, RT Q6H PRN        ROS:  Negative General: Denies fever, chills or weakness  Cardiovascular: Denies chest pain, chest pressure or palpitations  Respiratory: Denies shortness of breath, wheezing or cough  GI: Denies abdominal pain or n/v/d  GU: Denies pain with urination or blood in urine  Skin: Denies rashes or bruising  Neurological: Denies headaches, dizziness, gait problems, or seizures  Extremities: Denies swelling or numbness/tingling  Psychiatric: Denies hallucinations and  memory loss    Vitals:     Vitals:    04/21/21 2302 04/22/21 0357 04/22/21 0600 04/22/21 0700   BP: 129/64 116/56  105/62   BP Location: Left arm Left arm  Left arm   Patient Position: Sitting Sitting  Lying   Pulse: 93 87 83 92   Resp: 18 18  18    Temp: 97.9 ??F (36.6 ??C) 98.1 ??F (36.7 ??C)  97.7 ??F (36.5 ??C)   TempSrc: Oral Oral  Oral   SpO2: 100% 99% 97% 99%   Weight:       Height:           PE:  Gen: Awake, alert, no apparent distress, as stated age  HEENT: Normocephalic, atraumatic, EOMI  Neck: Supple, symmetrical, trachea midline and mobile  Chest: respirations unlabored  CVS: RRR  ABD: Soft, non-tender    Labs:   Recent Labs     04/20/21  0222 04/21/21  0842 04/22/21  0713   WBC 2.2* 2.1* 1.6*   HGB 9.8* 10.6* 9.9*   HCT 28.1* 31.1* 28.0*   MCV 115.9* 119.3* 117.8*   PLT 73* 64* 66*     Recent Labs     04/21/21  0842 04/21/21  1305 04/22/21  0713   NA 139 138 140   K 3.7 3.7 3.5   CL 107 107 109   CO2 26 26 25    PHOS 2.7 2.2 2.7   BUN 16 17 16    CREATININE 0.61 0.67 0.56*   CALCIUM 8.8 8.6 7.6*     Recent Labs     04/07/21  0149 04/07/21  1745 04/11/21  1129 04/11/21  1755 04/20/21  0222 04/21/21  0842 04/22/21  0713   BILITOT 13.8*   < >  --    < > 10.4* 9.7* 8.4*   AST 623*   < >  --    < > 103* 102* 69*   ALT 463*   < >  --    < > 153* 140* 119*   ALKPHOS 106   < >  --    < > 83 81 86   LIPASE 72  --  86*  --   --   --   --     < > = values in this interval not displayed.     Recent Labs     04/20/21  0222 04/20/21  1602 04/21/21  0842 04/21/21  1305 04/22/21  0713   ALBUMIN 2.7*   2.7*   < >  2.7*   2.7* 2.5* 2.4*   2.4*   BILIDIRECT 5.02*  --  4.27*  --  4.53*    < > = values in this interval not displayed.     Recent Labs     04/20/21  0222 04/21/21  0842 04/22/21  0713   INR 1.9* 1.7* 1.7*   PROTIME 22.2* 20.1* 20.9*       Imaging:  No results found for this or any previous visit from the past 72 hours.      No results found for this or any previous visit from the past 72 hours.        I personally  reviewed the relevant findings from the above imaging results.    Pre-Sedation Evaluation  Mallampati:  II  Mouth Opening:  > 4 cm  Facial Hair: No  Short Neck: No      ASA Score: II - Mild systemic disease with no functional limitations  Sedation-Specific History Concerns: None    This patient was re-evaluated immediately prior to sedation administration.            Medical Decision Making       Assessment:   Venicia Vandall is a 25 y.o. female  with LFTs elevated, worsening.    Plan:  1. Will proceed with random liver biopsy and possible paracentesis  2. Sedation Plan/Type: Moderate Sedation/Intravenous Fentanyl & Versed  3. NPO after midnight. Patient may have sips of water with morning medication.  4. If contrast allergy, please pre-medicate with Prednisone 50mg  PO at 13 hour, 7 hours, and 1 hour before procedure. Benadryl 50mg  PO 1 hour before procedure.  5. Hold SQ heparin from midnight or Lovenox 24 hrs.    Consent:  Consent obtained and in IR    IR procedure was reviewed with Leanora Ivanoff,  MD    Please call with any questions.          Mingo Amber, MD  Vascular & Interventional Radiology  04/22/2021,8:30 AM  [facility]: [phone])

## 2021-04-22 NOTE — Unmapped (Signed)
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 Recently admitted with symptoms of dizziness, jaundice started about 1.5 - 2 weeks ago, shortly after the start of starting injection as part of an In vitro fertilization protocol. On arrival at that time, liver enzymes were notable for AlkP of 106, AST/ALT of 623/463 and tbili 13.8, having all been WNL on 12/16/20. Started on Prednisone 40mg  on 7/26 in treatment of AIH flare however this was increased to 60mg  due to lack of response. Imuran was held due to leukopenia. TPMT was checked: 6-TGN was 391, 6-MMPN was 1888.    Interim:  -Seen after Liver biopsy: in significant abdominal pain. Painful to breath.    ROS:  10 pt ROS negative unless otherwise noted above    Vitals:  Temp:  [97.7 ??F (36.5 ??C)-98.2 ??F (36.8 ??C)] 97.7 ??F (36.5 ??C)  Heart Rate:  [83-123] 101  Resp:  [14-24] 20  BP: (105-129)/(52-72) 106/56  No intake or output data in the 24 hours ending 04/22/21 1153    Physical Exam   Gen: Well-developed, well nourished. Currently in pain  HEENT: NC/AT, EOMI, moist mucus membranes  CV: RRR, no m/r/g, no LE edema  Lungs: Clear to auscultation bilaterally.   Abdomen: Tender  Extremities: LE edema  Neuro: A&Ox3, moving all extremities spontaneous, no asterixis   Skin: no bruising/rashes/jaundice  Psych: normal affect    MELD-Na score: 20 at 04/22/2021  7:13 AM  Calculated from:  Serum Creatinine: 0.56 mg/dL (Using min of 1 mg/dL) at 1/61/0960  4:54 AM  Serum Sodium: 140 mmol/L (Using max of 137 mmol/L) at 04/22/2021  7:13 AM  Total Bilirubin: 8.4 mg/dL at 0/98/1191  4:78 AM  INR(ratio): 1.7 at 04/22/2021  7:13 AM  Age: 2 years        Assessment/Plan:   Vicki Quinley??is a 25 y.o.??female with a PMHx of Cirrhosis due to Autoimmune Hepatitis, PCOS who presents as a direct transfer worsening abdominal discomfort,  swelling and difficultly breathing.  ??  #Autoimmune Hepatitis related cirrhosis: Concern for ongoing flare with some improvement on steroids. Symptoms of LE swellling likely worse with prednisone. While her metabolites were not within toxic levels, her Leukopenia does appear to have improved off the Imuran. Given the severity of her disease noted by her cirrhosis and history of AIH flares, she will likely need a second line therapy such as tacrolimus.  -Liver biopsy today. Does not need to remain in house for results  -Continue Tacrolimus 2mg  BID  -Check trough on Tomorrow morning prior to AM dose.  -OK to discharge on Pred 60 and tacro with follow up plans for taper based on weekly labs followed with Dr. Boykin Nearing  -Can give small dose lasix for LE swelling  -Close monitoring of mental status. Currently at baseline. Avoid sedative medications.  -Continue Ursodiol for itching.        Samson Frederic, MD  Gastroenterology/Hepatology Fellow- PGY 6  Pager [phone]

## 2021-04-23 LAB — POC GLU MONITORING DEVICE
POC Glucose Monitoring Device: 206 mg/dL (ref 70–100)
POC Glucose Monitoring Device: 177 mg/dL — ABNORMAL HIGH (ref 70–100)
POC Glucose Monitoring Device: 114 mg/dL (ref 70–100)

## 2021-04-23 LAB — CBC
Hematocrit: 31.5 % (ref 35.0–45.0)
Hemoglobin: 10.8 g/dL (ref 11.7–15.5)
MCH: 40.7 pg (ref 27.0–33.0)
MCHC: 34.3 g/dL (ref 32.0–36.0)
MCV: 118.5 fL (ref 80.0–100.0)
MPV: 7.4 fL (ref 7.5–11.5)
Platelets: 68 10*3/uL (ref 140–400)
RBC: 2.66 10*6/uL (ref 3.80–5.10)
RDW: 23.2 % (ref 11.0–15.0)
WBC: 2.2 10*3/uL (ref 3.8–10.8)

## 2021-04-23 LAB — RENAL FUNCTION PANEL W/EGFR
Albumin: 2.8 g/dL (ref 3.5–5.7)
Anion Gap: 6 mmol/L (ref 3–16)
BUN: 19 mg/dL (ref 7–25)
CO2: 25 mmol/L (ref 21–33)
Calcium: 8 mg/dL (ref 8.6–10.3)
Chloride: 104 mmol/L (ref 98–110)
Creatinine: 0.82 mg/dL (ref 0.60–1.30)
EGFR: >90
Glucose: 133 mg/dL (ref 70–100)
Osmolality, Calculated: 284 mOsm/kg (ref 278–305)
Phosphorus: 2.9 mg/dL (ref 2.1–4.7)
Potassium: 3.7 mmol/L (ref 3.5–5.3)
Sodium: 135 mmol/L (ref 133–146)

## 2021-04-23 LAB — HEPATIC FUNCTION PANEL
ALT: 128 U/L (ref 7–52)
AST: 67 U/L (ref 13–39)
Albumin: 2.8 g/dL (ref 3.5–5.7)
Alkaline Phosphatase: 108 U/L (ref 36–125)
Bilirubin, Direct: 5.62 mg/dL (ref 0.00–0.40)
Bilirubin, Indirect: 4.68 mg/dL (ref 0.00–1.10)
Total Bilirubin: 10.3 mg/dL (ref 0.0–1.5)
Total Protein: 5.9 g/dL (ref 6.4–8.9)

## 2021-04-23 LAB — PROTIME-INR
INR: 1.5 (ref 0.9–1.1)
Protime: 18.9 seconds (ref 12.1–15.1)

## 2021-04-23 LAB — MAGNESIUM: Magnesium: 2 mg/dL (ref 1.5–2.5)

## 2021-04-23 LAB — TACROLIMUS LEVEL: Tacrolimus (LC-MS): 8.1 ng/mL (ref 3.0–15.0)

## 2021-04-23 MED ORDER — rifAXIMin (XIFAXAN) 550 mg Tab tablet
550 | ORAL_TABLET | Freq: Two times a day (BID) | ORAL | 2 refills | Status: AC
Start: 2021-04-23 — End: ?

## 2021-04-23 MED ORDER — blood-glucose meter (FREESTYLE LITE METER) kit
PACK | 0 refills | 30.00000 days | Status: AC
Start: 2021-04-23 — End: ?

## 2021-04-23 MED ORDER — blood sugar diagnostic (FREESTYLE LITE STRIPS) Strp
ORAL_STRIP | 3 refills | 30.00000 days | Status: AC
Start: 2021-04-23 — End: ?

## 2021-04-23 MED ORDER — oxyCODONE (ROXICODONE) 5 MG immediate release tablet
5 | ORAL_TABLET | ORAL | 0 refills | 6.00000 days | Status: AC | PRN
Start: 2021-04-23 — End: 2021-04-23
  Filled 2021-04-23: qty 12, 2d supply, fill #0

## 2021-04-23 MED ORDER — naloxone (NARCAN) 4 mg/actuation Spry
4 | NASAL | 1 refills | Status: AC | PRN
Start: 2021-04-23 — End: ?

## 2021-04-23 MED ORDER — oxyCODONE (ROXICODONE) 5 MG immediate release tablet
5 | ORAL_TABLET | ORAL | 0 refills | 6.00000 days | Status: AC | PRN
Start: 2021-04-23 — End: 2021-04-23

## 2021-04-23 MED ORDER — furosemide (LASIX) 20 MG tablet
20 | ORAL_TABLET | Freq: Every day | ORAL | 0 refills | Status: AC
Start: 2021-04-23 — End: 2021-04-23

## 2021-04-23 MED ORDER — furosemide (LASIX) 20 MG tablet
20 | ORAL_TABLET | Freq: Every day | ORAL | 0 refills | Status: AC
Start: 2021-04-23 — End: ?
  Filled 2021-04-23: qty 30, 30d supply, fill #0

## 2021-04-23 MED ORDER — potassium chloride (KLOR-CON M20) 20 MEQ tablet
20 | ORAL_TABLET | Freq: Every day | ORAL | 0 refills | 30.00000 days | Status: AC
Start: 2021-04-23 — End: ?

## 2021-04-23 MED ORDER — tacrolimus (PROGRAF) 1 MG capsule
1 | ORAL_CAPSULE | Freq: Two times a day (BID) | ORAL | 0 refills | Status: AC
Start: 2021-04-23 — End: ?

## 2021-04-23 MED ORDER — potassium chloride (KLOR-CON M20) 20 MEQ tablet
20 | ORAL_TABLET | Freq: Every day | ORAL | 0 refills | 30.00000 days | Status: AC
Start: 2021-04-23 — End: 2021-04-23
  Filled 2021-04-23: qty 30, 30d supply, fill #0

## 2021-04-23 MED FILL — MAGNESIUM OXIDE 400 MG (241.3 MG MAGNESIUM) TABLET: 400 400 mg | ORAL | Qty: 1

## 2021-04-23 MED FILL — OXYCODONE 5 MG TABLET: 5 5 MG | ORAL | Qty: 1

## 2021-04-23 MED FILL — PREDNISONE 20 MG TABLET: 20 20 MG | ORAL | Qty: 3

## 2021-04-23 MED FILL — URSODIOL 300 MG CAPSULE: 300 300 mg | ORAL | Qty: 2

## 2021-04-23 MED FILL — PROMETHAZINE 25 MG/ML INJECTION SOLUTION: 25 25 mg/mL | INTRAMUSCULAR | Qty: 1

## 2021-04-23 MED FILL — PANTOPRAZOLE 40 MG TABLET,DELAYED RELEASE: 40 40 MG | ORAL | Qty: 1

## 2021-04-23 MED FILL — TACROLIMUS 1 MG CAPSULE, IMMEDIATE-RELEASE: 1 1 MG | ORAL | Qty: 2

## 2021-04-23 MED FILL — ENOXAPARIN 40 MG/0.4 ML SUBCUTANEOUS SYRINGE: 40 40 mg/0.4 mL | SUBCUTANEOUS | Qty: 0.4

## 2021-04-23 MED FILL — URSODIOL 300 MG CAPSULE: 300 300 mg | ORAL | Qty: 1

## 2021-04-23 MED FILL — XIFAXAN 550 MG TABLET: 550 550 mg | ORAL | Qty: 1

## 2021-04-23 NOTE — Unmapped (Signed)
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 Recently admitted with symptoms of dizziness, jaundice started about 1.5 - 2 weeks ago, shortly after the start of starting injection as part of an In vitro fertilization protocol. On arrival at that time, liver enzymes were notable for AlkP of 106, AST/ALT of 623/463 and tbili 13.8, having all been WNL on 12/16/20. Started on Prednisone 40mg  on 7/26 in treatment of AIH flare however this was increased to 60mg  due to lack of response. Imuran was held due to leukopenia. TPMT was checked: 6-TGN was 391, 6-MMPN was 1888.    Interim:  -Abdominal pain better from yesterday though still present  -Concerned about LE swelling on steroids.      ROS:  10 pt ROS negative unless otherwise noted above    Vitals:  Temp:  [97.2 ??F (36.2 ??C)-98.2 ??F (36.8 ??C)] 97.4 ??F (36.3 ??C)  Heart Rate:  [73-103] 94  Resp:  [16-18] 16  BP: (101-148)/(56-90) 127/67    Intake/Output Summary (Last 24 hours) at 04/23/2021 1143  Last data filed at 04/23/2021 1610  Gross per 24 hour   Intake 140 ml   Output 1200 ml   Net -1060 ml       Physical Exam   Gen: Well-developed, well nourished.   HEENT: NC/AT, EOMI, moist mucus membranes  CV: RRR, no m/r/g, no LE edema  Lungs: Clear to auscultation bilaterally.   Abdomen: Tender  Extremities: LE edema  Neuro: A&Ox3, moving all extremities spontaneous, no asterixis   Skin: no bruising/rashes/jaundice  Psych: normal affect    MELD-Na score: 21 at 04/23/2021  8:36 AM  Calculated from:  Serum Creatinine: 0.82 mg/dL (Using min of 1 mg/dL) at 9/60/4540  9:81 AM  Serum Sodium: 135 mmol/L at 04/23/2021  8:36 AM  Total Bilirubin: 10.3 mg/dL at 1/91/4782  9:56 AM  INR(ratio): 1.5 at 04/23/2021  8:36 AM  Age: 66 years        Assessment/Plan:   Tracy Watkins??is a 25 y.o.??female with a PMHx of Cirrhosis due to Autoimmune  Hepatitis, PCOS who presents as a direct transfer worsening abdominal discomfort, swelling and difficultly breathing.  ??  #Autoimmune Hepatitis related cirrhosis: Concern for ongoing flare with some improvement on steroids. Symptoms of LE swellling likely worse with prednisone.   -Liver biopsy results pending. Does not need to remain in house for results  -Continue Tacrolimus 2mg  BID on discharge  -Continue prednisone 60mg  on discharge. Taper will be planned as outpatient  -Weekly labs followed with Dr. Boykin Nearing. Patient has these ordered already  -Can discharge on small dose lasix for LE swelling  -Hepatology will sign off. Call with questions.        Samson Frederic, MD  Gastroenterology/Hepatology Fellow- PGY 6  Pager [phone]

## 2021-04-23 NOTE — Unmapped (Signed)
Problem: Pain  Goal: Patient's pain is progressing toward patient's stated pain goal  Description: Assess and monitor patient's pain using appropriate pain scale. Collaborate with interdisciplinary team and initiate plan and interventions as ordered. Re-assess patient's pain level 30 - 60 minutes after pain management intervention.   Outcome: Progressing     Problem: Fall Prevention  Goal: Patient will remain free of falls  Description: Assess and monitor vitals signs, neurological status including level of consciousness and orientation.  Reassess fall risk per hospital policy.    Ensure arm band on, uncluttered walking paths in room, adequate room lighting, call light and overbed table within reach, bed in low position, wheels locked, side rails up per policy (excluding SNF), and non-skid footwear provided.   Outcome: Progressing

## 2021-04-24 NOTE — Unmapped (Signed)
Follow up call to [facility] following an IR liver biopsy.  No answer, left voicemail.  IR phone number [phone]) provided for question or concerns.

## 2021-04-24 NOTE — Unmapped (Signed)
Pt called in to follow up on encounter    Pt advised that she feels stomach is getting bigger and more distended , painful and pitting as well. States she feels like she has become more distended since call earlier    Pt requests return call at [phone] before end of day today.

## 2021-04-24 NOTE — Unmapped (Signed)
[name]  Department of Medicine  TRANSFER CALL NOTE    History of Present Ilness:     Tracy Watkins is a 25 y.o. 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     Patient was discharged yesterday from [facility] after having liver biopsy done on 04/22/21. Has since had worsening lower extremity edema and abdominal distention. OSH ER wanted to admit to observation, however hospitalist team wanted them to check with UC if would be more appropriate for transfer.        Objective:     Last Known Vitals:  HR: 115-120s    Pertinent Physical Exam Findings  Edematous on exam. Dyspneic.     Pertinent Lab Studies:  T Bili 10  Direct Bili 6.7    Pertinent Imaging Studies:  CT Abd Pelvis w/ Contrast:   NO IDENTIFIABLE PULMONARY EMBOLUS ON MOTION DEGRADED EXAM WHICH MAY MISS SMALL NONOCCLUSIVE SEGMENTAL OR SUBSEGMENTAL PULMONARY EMBOLI.     LEFT JUGULAR CENTRAL VENOUS CATHETER TERMINATES IN THE RIGHT ATRIUM APPROXIMATELY 2.8 CM BELOW THE CAVOATRIAL JUNCTION. CONSIDER REPOSITIONING.     BORDERLINE CARDIOMEGALY WITH FINDINGS SUGGESTIVE OF PULMONARY EDEMA AND SMALL RIGHT PLEURAL EFFUSION.     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     SEGMENTAL WALL THICKENING OF THE PROXIMAL JEJUNUM MAY REFLECT ENTERITIS OF INFECTIOUS OR INFLAMMATORY ETIOLOGY OR BE SECONDARY TO LIVER DISEASE. BOWEL IS UNOBSTRUCTED WITHOUT EVIDENCE OF PERFORATION OR ABSCESS, AND THE APPENDIX APPEARS NORMAL     ADDITIONAL AND ANCILLARY FINDINGS, AS ABOVE.     DICTATED BY  Alonza Smoker, M.D.     CT Chest PE Protocol:   IMPRESSION:     NO IDENTIFIABLE PULMONARY EMBOLUS ON MOTION DEGRADED EXAM WHICH MAY MISS SMALL NONOCCLUSIVE SEGMENTAL OR SUBSEGMENTAL PULMONARY EMBOLI.     LEFT JUGULAR CENTRAL VENOUS CATHETER TERMINATES IN THE RIGHT ATRIUM APPROXIMATELY 2.8 CM BELOW THE CAVOATRIAL JUNCTION. CONSIDER REPOSITIONING.     BORDERLINE CARDIOMEGALY WITH FINDINGS SUGGESTIVE OF PULMONARY EDEMA AND SMALL RIGHT PLEURAL EFFUSION.     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     SEGMENTAL WALL THICKENING OF THE PROXIMAL JEJUNUM MAY REFLECT ENTERITIS OF INFECTIOUS OR INFLAMMATORY ETIOLOGY OR BE SECONDARY TO LIVER DISEASE. BOWEL IS UNOBSTRUCTED WITHOUT EVIDENCE OF PERFORATION OR ABSCESS, AND THE APPENDIX APPEARS NORMAL     ADDITIONAL AND ANCILLARY FINDINGS, AS ABOVE.     DICTATED BY Alonza Smoker, M.D.     OSH Interventions:     OSH initiated the following interventions:  - Albumin and furosemide ordered       Assessment & Plan:   Tracy Watkins is a 25 y.o. female who presented to OSH with  increased swelling in lower extremities, abdominal distention, and difficulty breathing.     Transfer NOT accepted.    This case was discussed with Dr. Berenda Morale.     Signed:  Sheliah Plane, MD   Internal Medicine  04/24/2021  11:41 PM

## 2021-04-24 NOTE — Unmapped (Signed)
Patient's husband called in and stated no one has responded to him.    I advised patient's husband there is a message out to MD and just awaiting a response.       Patient's husband stated he wanted to speak to nurse triage so they can help with patient signs/symptoms since MD wasn't responding, I transferred patient's husband to nurse triage per his request.

## 2021-04-24 NOTE — Unmapped (Signed)
Husband calling.  Asking if patient can d/c Cholestryamine.  States patient did not receive this medication any time during last hospitalization.  Patient re-started med yesterday, today complaining of increased abdominal distension that she feels is related to the med.  No difficulty breathing, just feels puffier than usual.  Asking for MD to call re this.

## 2021-04-24 NOTE — Unmapped (Addendum)
RN returned call . Pt is now starting with SOB, pt directed to go to ER, URGENT care or call 911 if Respiratory distress. Pt states she feels like it is getting to that point.  Pt instructed to not take any more doses of cholestyramine-aspartame (PREVALITE) until seen and evaluated to determine if allergic reaction. PT has not taken since yesterday evening 04/23/21. RN states will make Dr Boykin Nearing aware and will determine if will need alternative needed for future. Spouse is with pt. PT verbalized understanding.

## 2021-04-24 NOTE — Unmapped (Signed)
RN returned call. Spouse answered. RN explained that we had received call but was awaiting reply from Dr Boykin Nearing who is in clinic at [facility]. Explained that I am not authorized to stop a medication . Explained that there could be other medical reasons for her symptoms other than the medication. Spouse states that pt is now on phone with Nurse triage in determining if should return to ER. RN will follow up with this further when pt available .

## 2021-04-24 NOTE — Unmapped (Signed)
Pt calling with husband to NT concerned that generalized swelling believed by tto be r/t Cholestyramine-aspartame new rx has worsened over the last 24 hrs and is now spreading to her face. No acute SOB, CP, or rash. However endorsing abdominal discomfort and nausea. D/t progression of symptoms and last dose yesterday evening. RN believes it is best pt dispo to ER prior to weekend to be assessed by an MD for allergic reaction vs FVO r/t liver issues. RN routing Laurette Schimke MD and RN as Lorain Childes and for follow-up.

## 2021-05-06 ENCOUNTER — Ambulatory Visit: Payer: TRICARE (CHAMPUS)

## 2021-07-29 ENCOUNTER — Ambulatory Visit: Payer: TRICARE (CHAMPUS)

## 2022-03-06 IMAGING — CT CT HEAD WITHOUT CONTRAST
2 of 3 series · 15 of 40 positions shown, 18 images · non-contrast
Comparison: none

FINAL REPORT:
CT scan of the head. [DATE], 2922 9292 hours
CLINICAL HISTORY: Seizure, new-onset. No history of trauma
TECHNIQUE: Helical axial sections with sagittal and coronal reformats of the head were obtained without contrast. Iterative reconstruction technique was employed to reduce patient radiation exposure.
Radiation Dose: Total exam DLP 844 mGy/cm.
No prior study is available for comparison.

[Series 2: head stnd · axial · 0.49mm/px · z∈[+14,+149]mm · 12 of 32 slices shown, 15 images]
[im 3/32  brain]
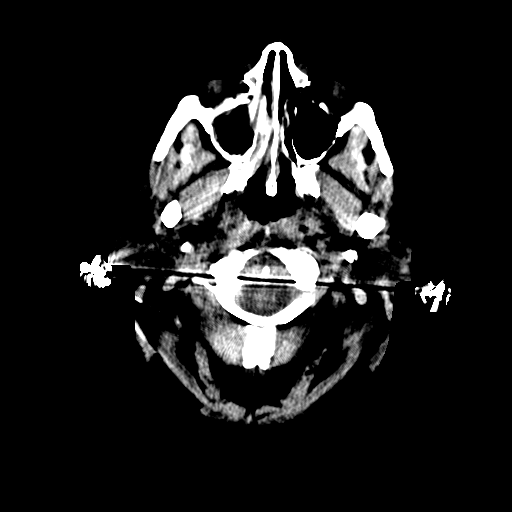
[im 3/32  bone]
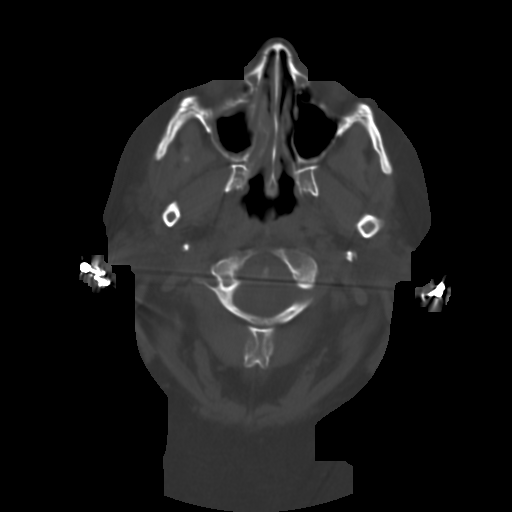
[im 5/32  brain]
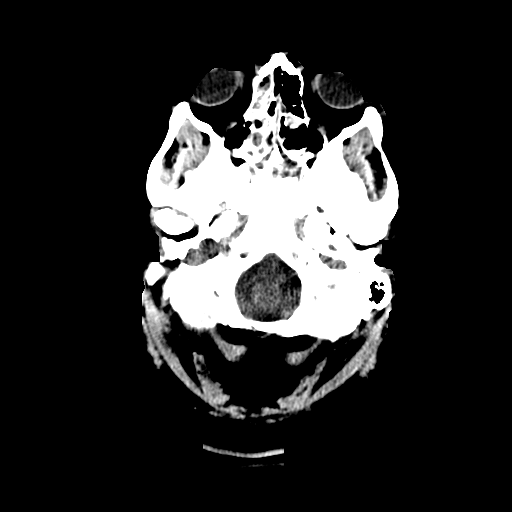
[im 7/32  brain]
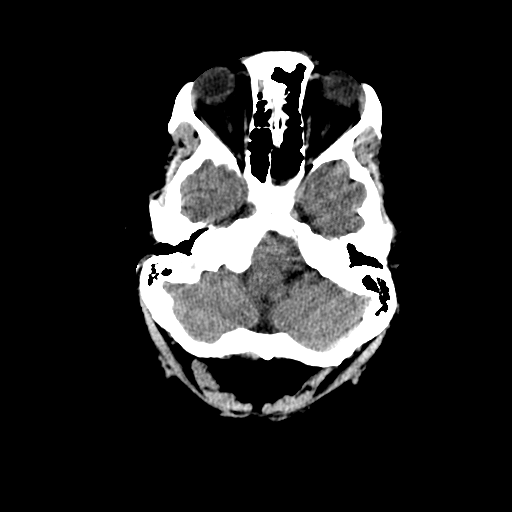
[im 10/32  brain]
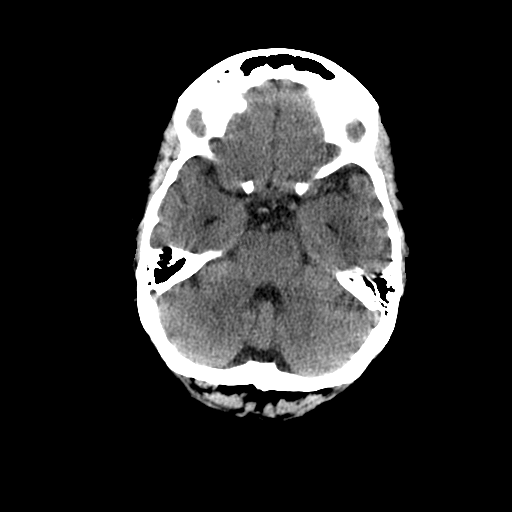
[im 12/32  brain]
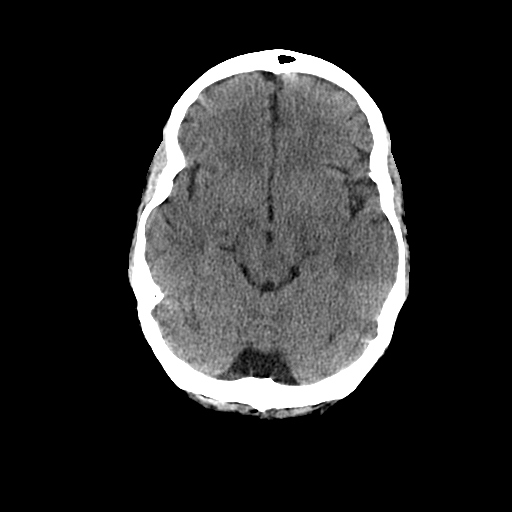
[im 12/32  bone]
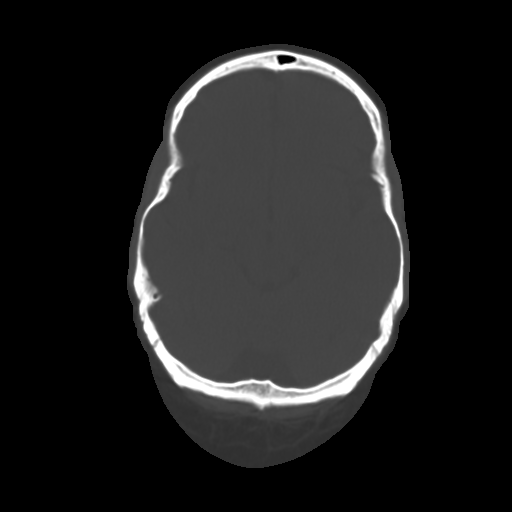
[im 14/32  brain]
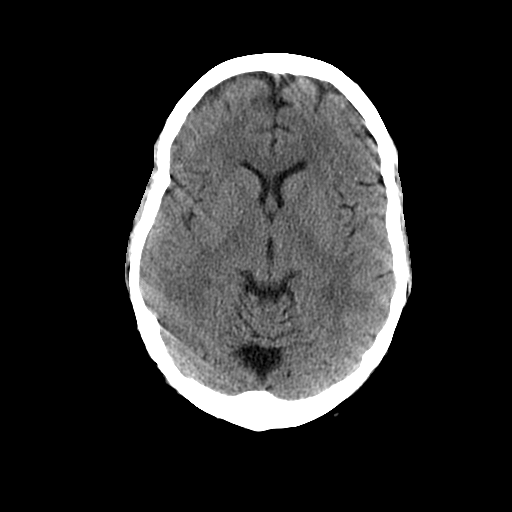
[im 18/32  brain]
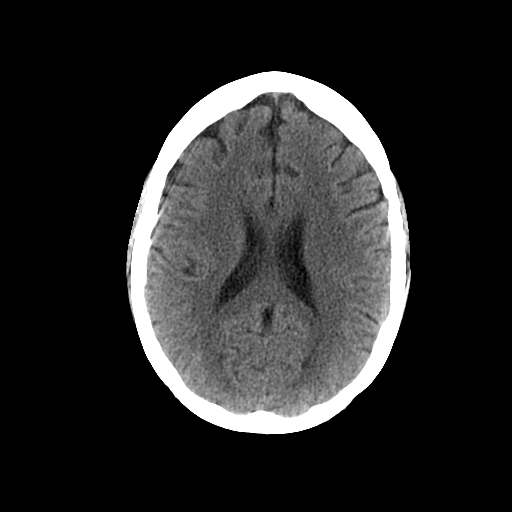
[im 20/32  brain]
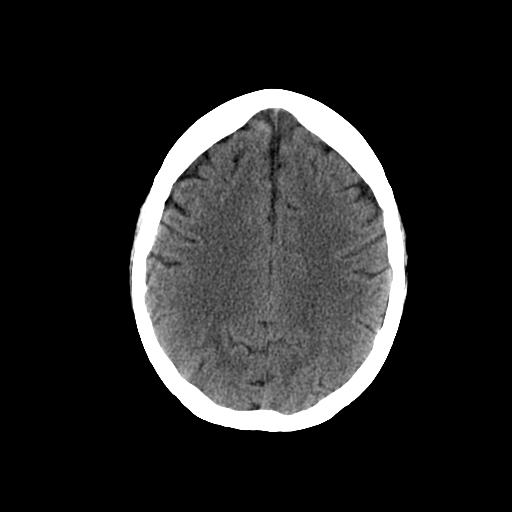
[im 22/32  brain]
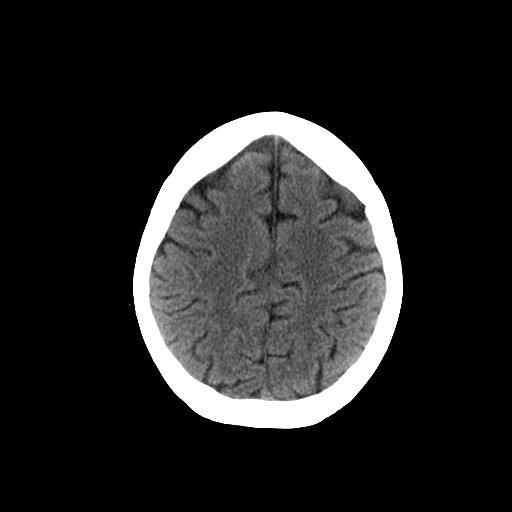
[im 22/32  bone]
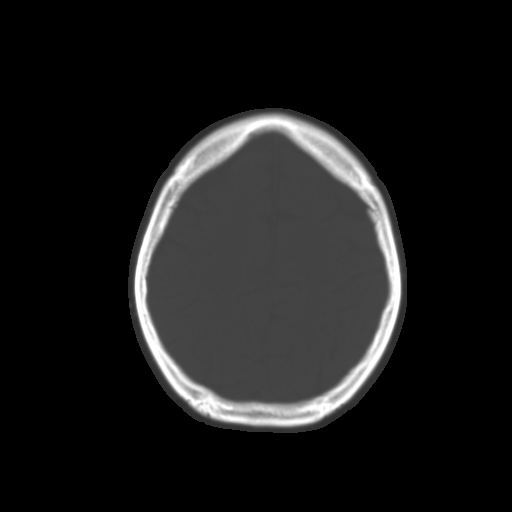
[im 25/32  brain]
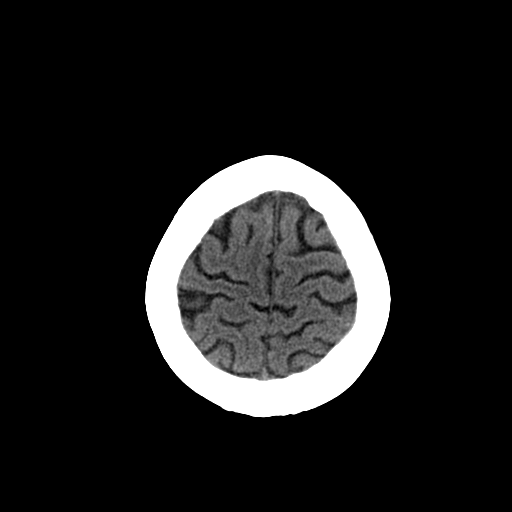
[im 27/32  brain]
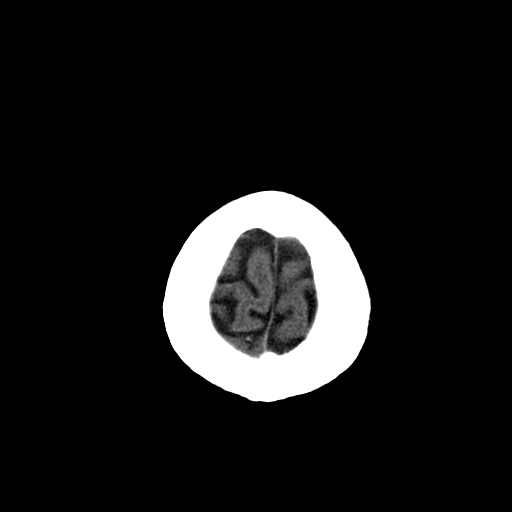
[im 29/32  brain]
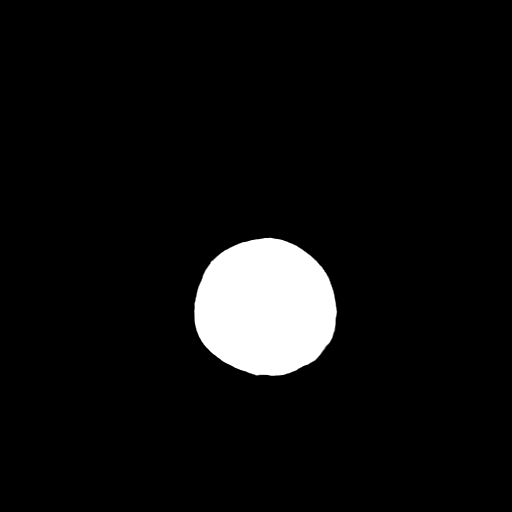

[Series 601: cor head · coronal · 0.49mm/px · 3 of 119 slices shown]
[im 43/119  brain]
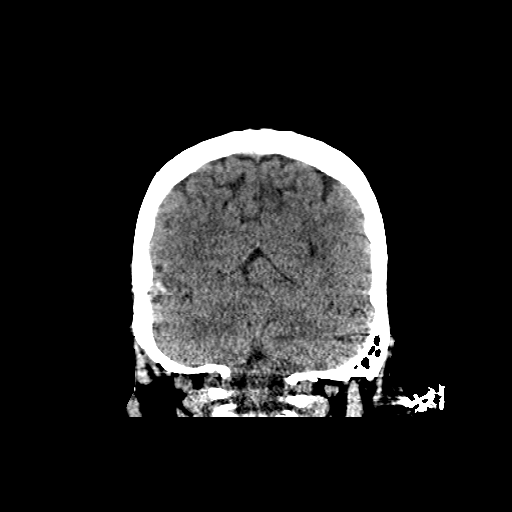
[im 55/119  brain]
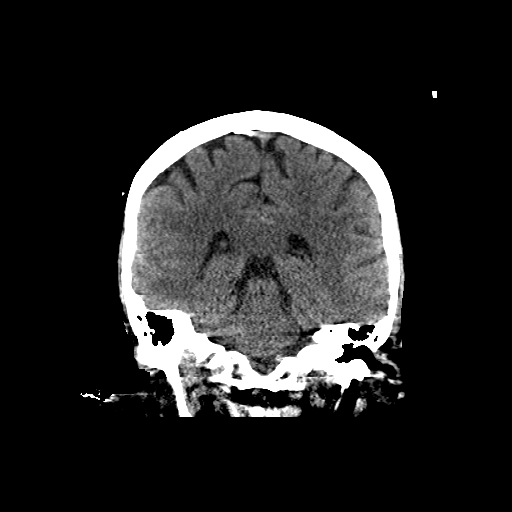
[im 68/119  brain]
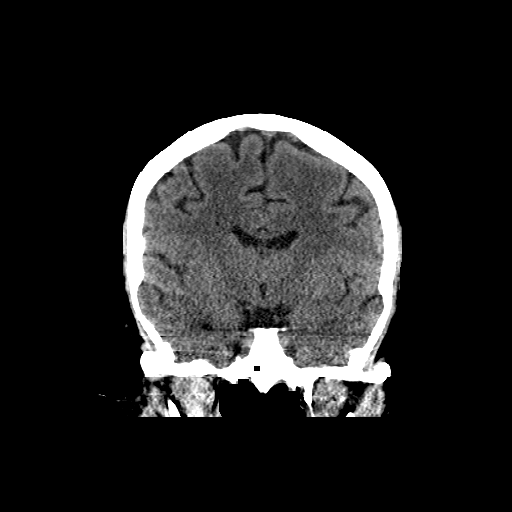

[15 of 40 positions shown; findings below may reference images not displayed]

FINDINGS: No evidence of intracranial hemorrhage, mass effect or midline shift. The ventricles and CSF spaces are unremarkable. A prominent cisterna magna is incidentally noted. The calvarium is intact. The mastoid air cells and other visualized paranasal sinuses are clear. There is mild mucosal thickening in bilateral maxillary, ethmoid and sphenoid sinuses.
IMPRESSION: 
IMPRESSION: No evidence of intracranial hemorrhage, midline shift or calvarial fracture. If there are persistent clinical symptoms or additional clinical concerns consider an MRI.
Other findings as described above.
All CT scans at this facility use iterative reconstruction technique, dose modulation, and/or weight based dosing when appropriate to reduce radiation dose to as low as reasonably achievable.
Is the patient pregnant?
Unknown

## 2022-03-16 IMAGING — CT CT HEAD WITHOUT CONTRAST
2 of 3 series · 16 of 40 positions shown, 20 images · non-contrast
Comparison: 03/06/2022

fatigue and tremors
FINAL REPORT:
CT HEAD WITHOUT CONTRAST
INDICATION: Mental status change, unknown cause
TECHNIQUE: Helical CT images from skull base to vertex without IV contrast. Dose reduction techniques were utilized for this examination.

[Series 2: head stnd · axial · 0.49mm/px · z∈[-13,+122]mm · 13 of 32 slices shown, 17 images]
[im 3/32  brain]
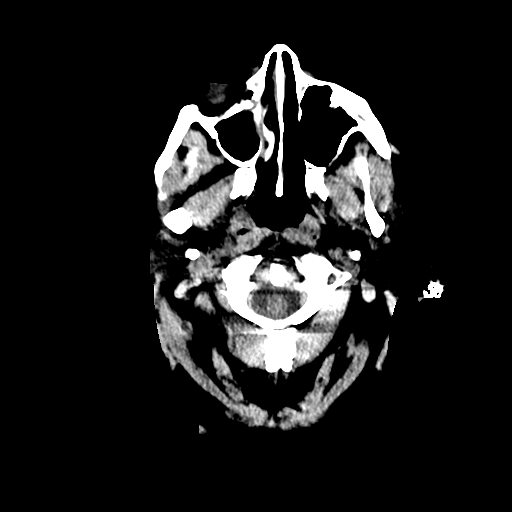
[im 3/32  bone]
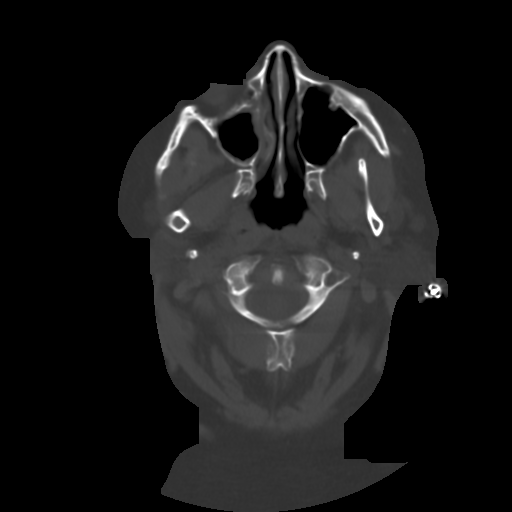
[im 5/32  brain]
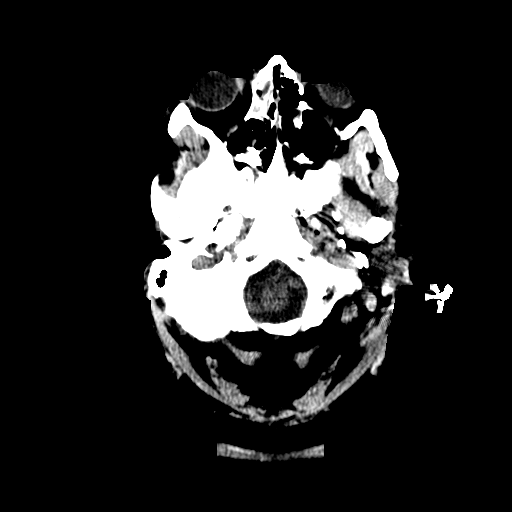
[im 7/32  brain]
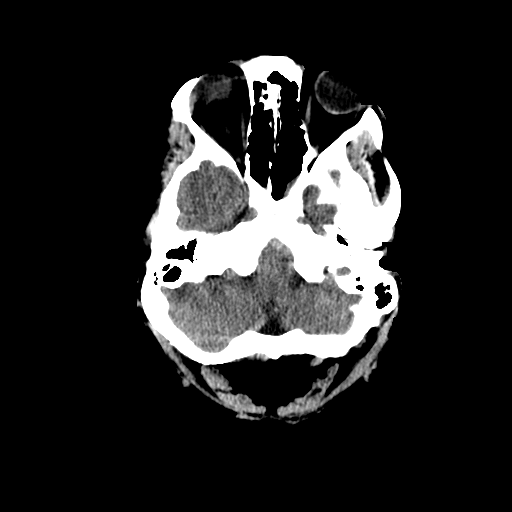
[im 9/32  brain]
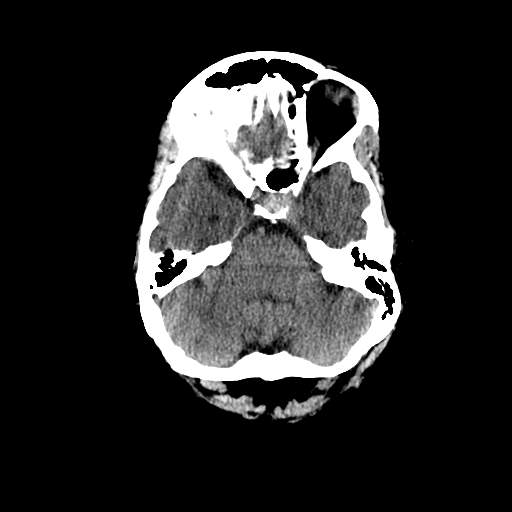
[im 11/32  brain]
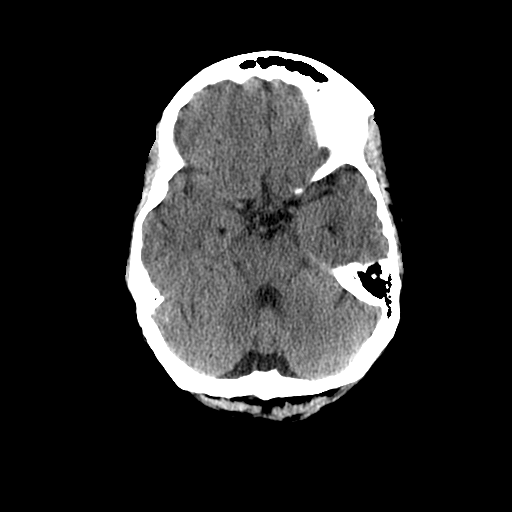
[im 11/32  bone]
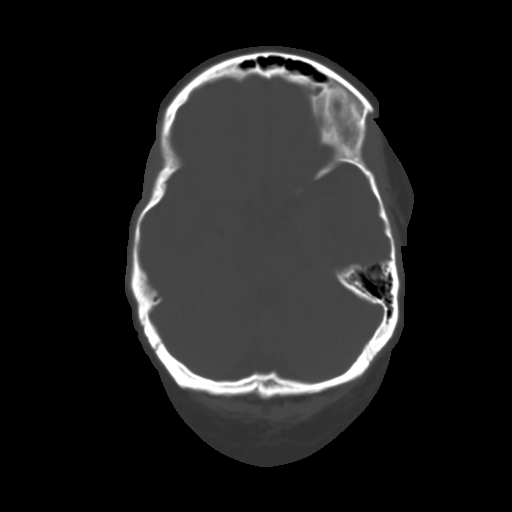
[im 13/32  brain]
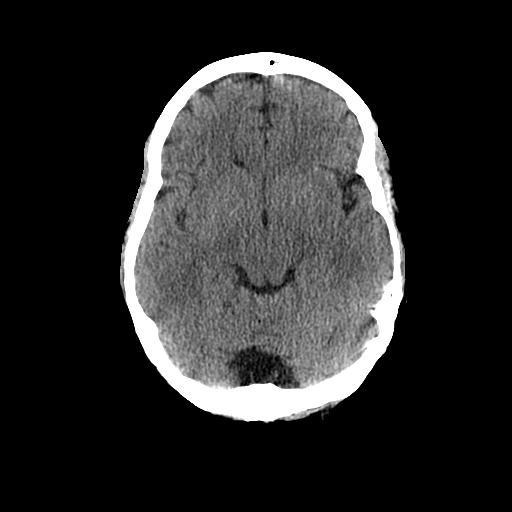
[im 17/32  brain]
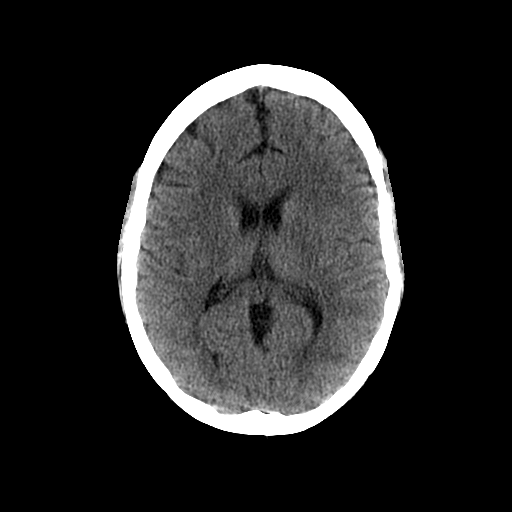
[im 19/32  brain]
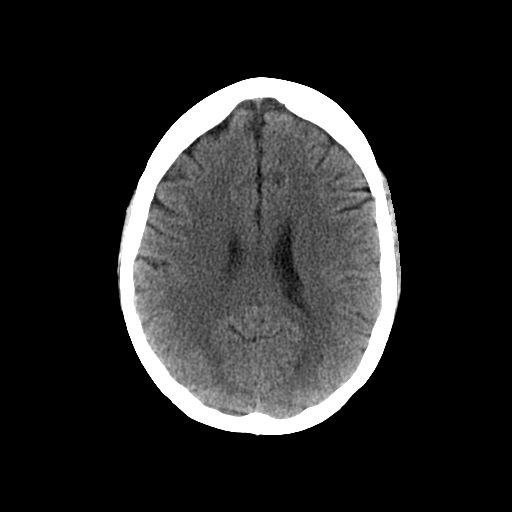
[im 21/32  brain]
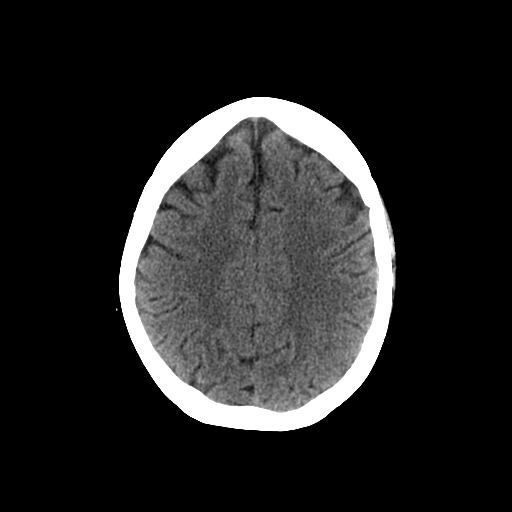
[im 21/32  bone]
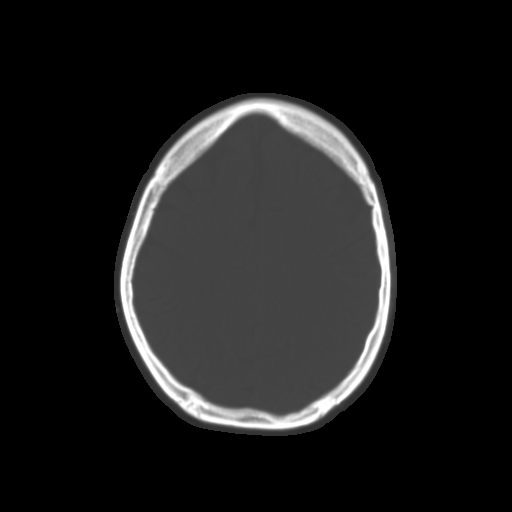
[im 23/32  brain]
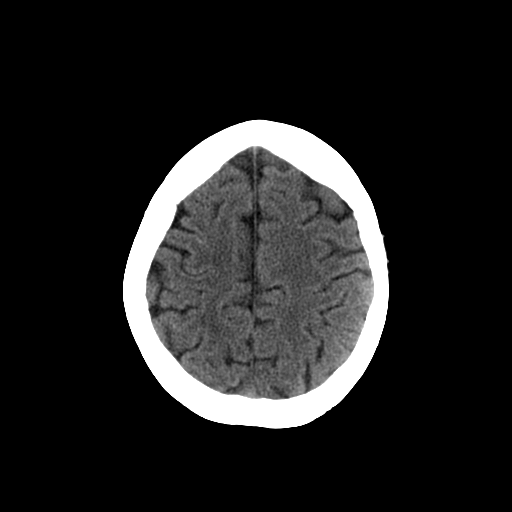
[im 25/32  brain]
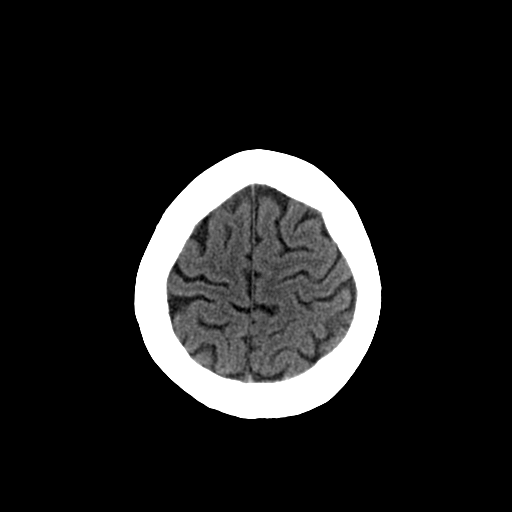
[im 27/32  brain]
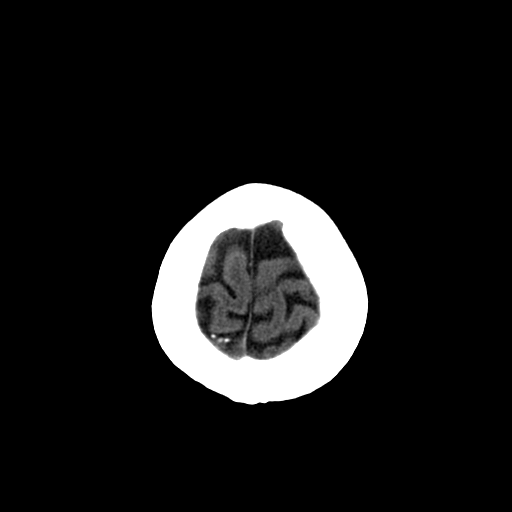
[im 29/32  brain]
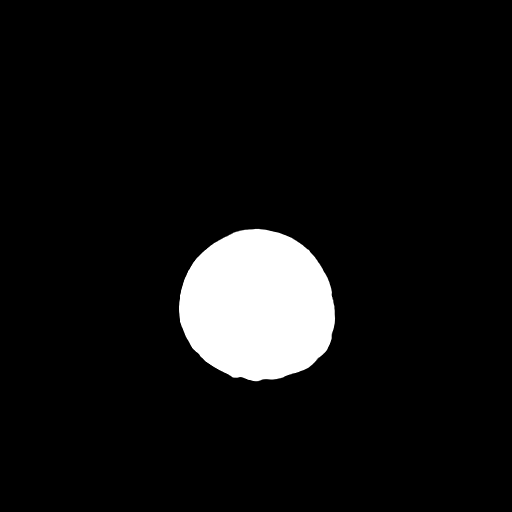
[im 29/32  bone]
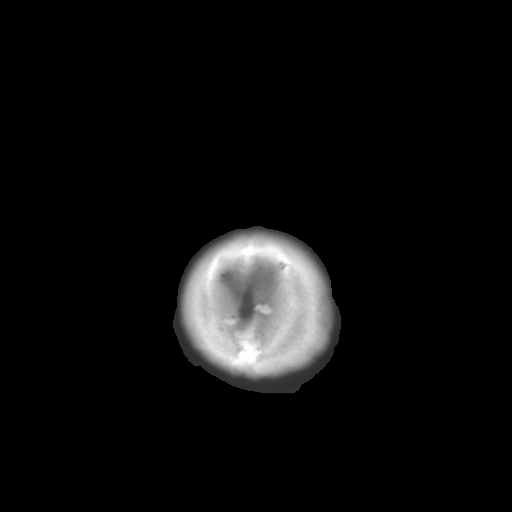

[Series 601: cor head · coronal · 0.49mm/px · 3 of 119 slices shown]
[im 39/119  brain]
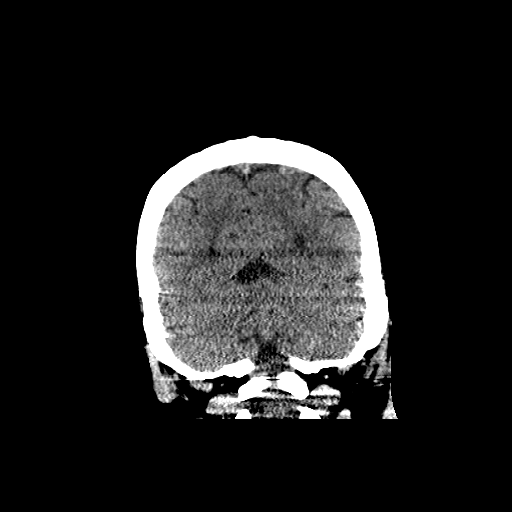
[im 52/119  brain]
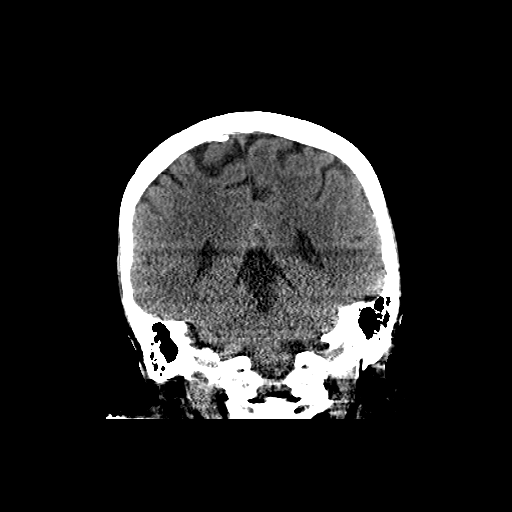
[im 66/119  brain]
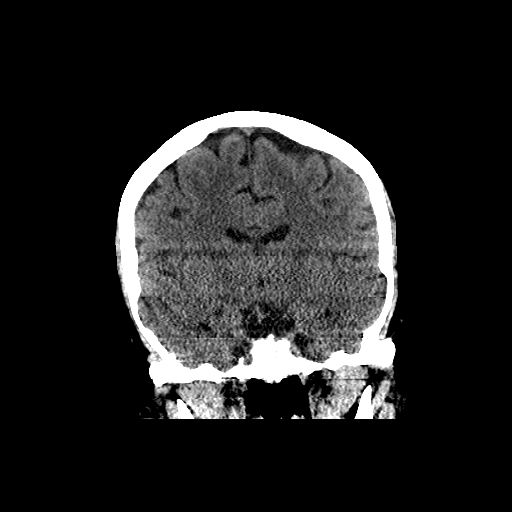

[16 of 40 positions shown; findings below may reference images not displayed]

FINDINGS: Parenchyma: No acute large territory infarction. No acute hemorrhage. No mass or mass effect.
Extra-axial Collection:  None
Ventricular System:  Normal
Dural Venous Sinuses:  Normal
Osseous Structures:  Normal
Included Orbits: Normal
Paranasal Sinuses:  Mild mucosal thickening in the right maxillary and bilateral ethmoidal sinuses.
Tympanomastoid Cavities:  Normal
Other:  None
IMPRESSION: No acute intracranial abnormality.
Is the patient pregnant?
No

## 2022-03-18 IMAGING — US US ABDOMEN RUQ
1 series · 14 of 25 positions shown · non-contrast
Comparison: None.

FINAL REPORT:
EXAM: US ABDOMEN RUQ
CLINICAL INDICATION: cirrhosis, RUQ pain, cholelithiasis.
TECHNIQUE: Axial and longitudinal images were obtained of the right upper abdomen using real-time ultrasound.

[Series 1: us abdomen ruq · 14 of 71 slices shown]
[im 1/71]
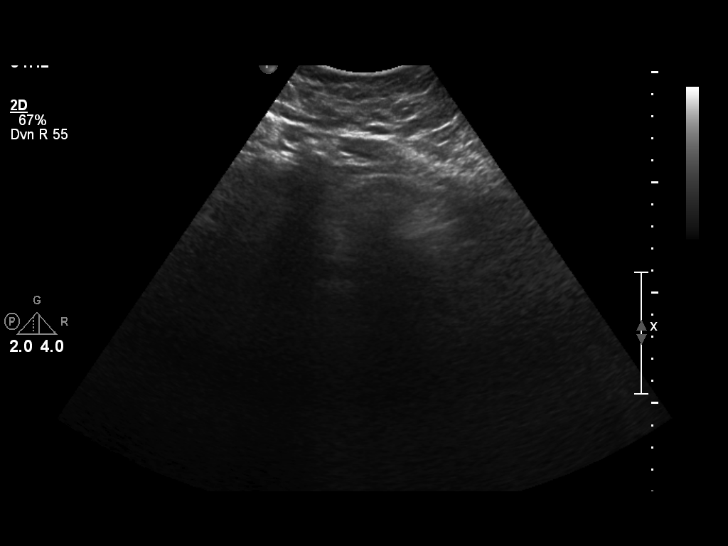
[im 6/71]
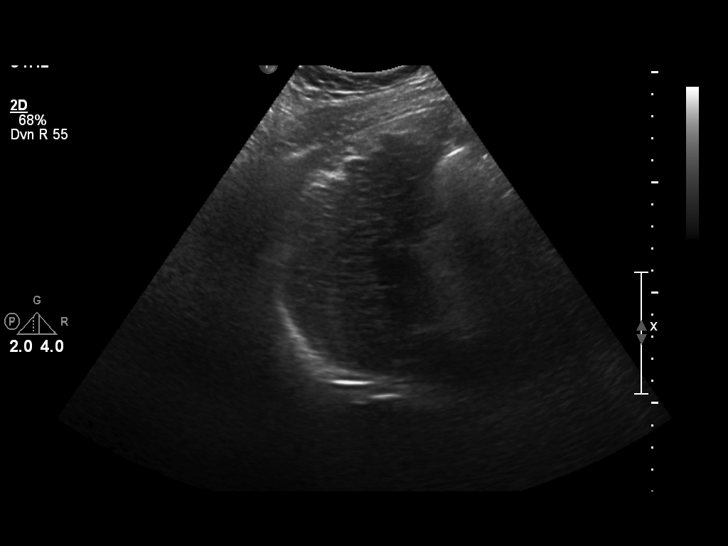
[im 12/71]
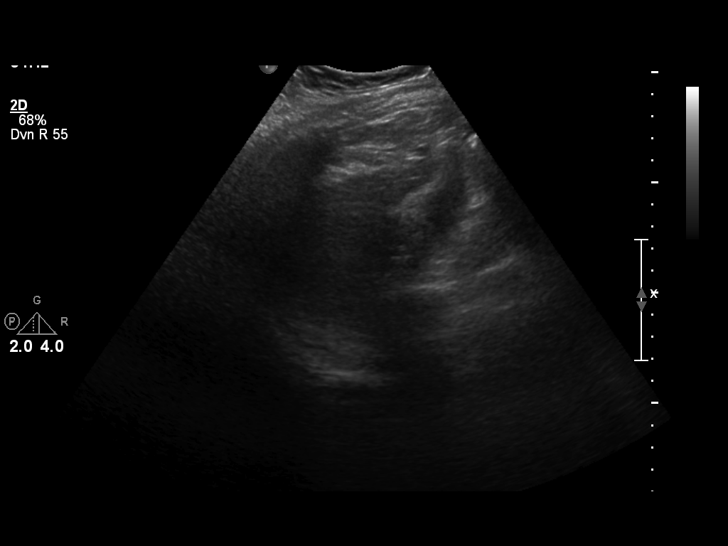
[im 18/71]
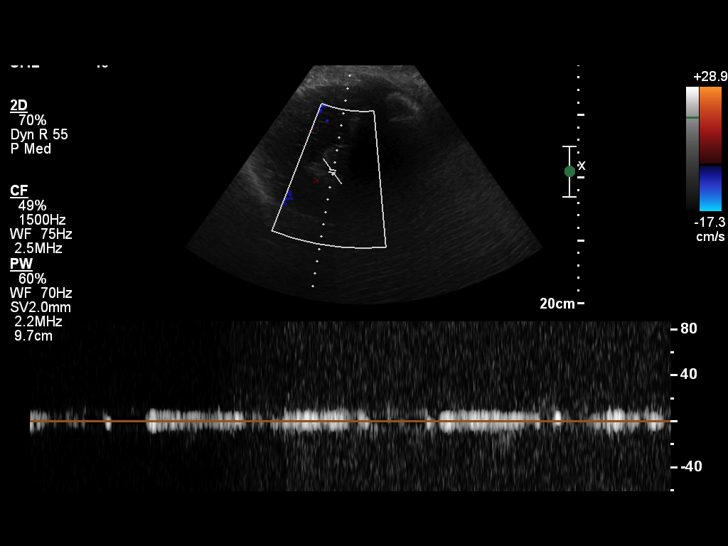
[im 24/71]
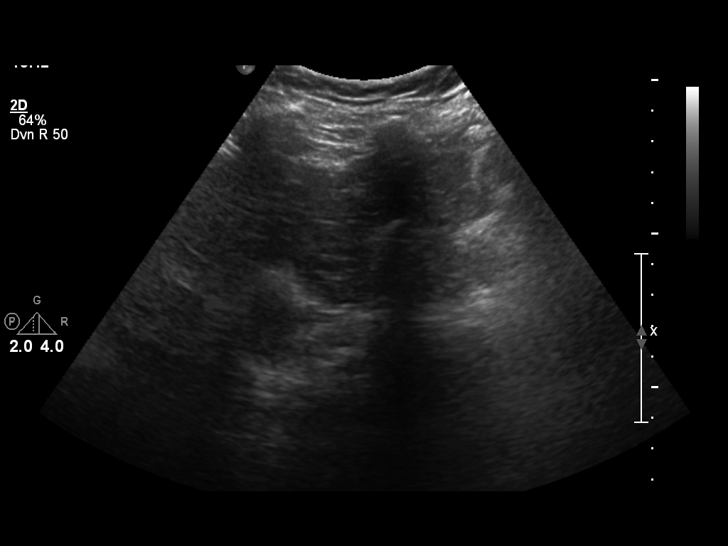
[im 27/71]
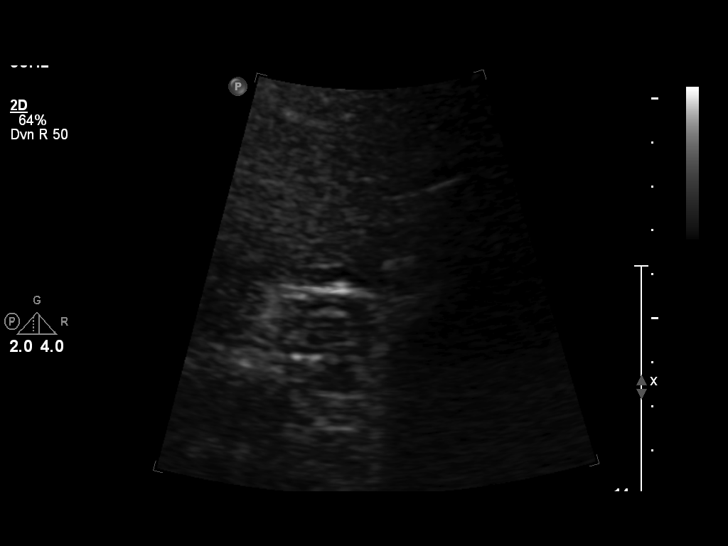
[im 33/71]
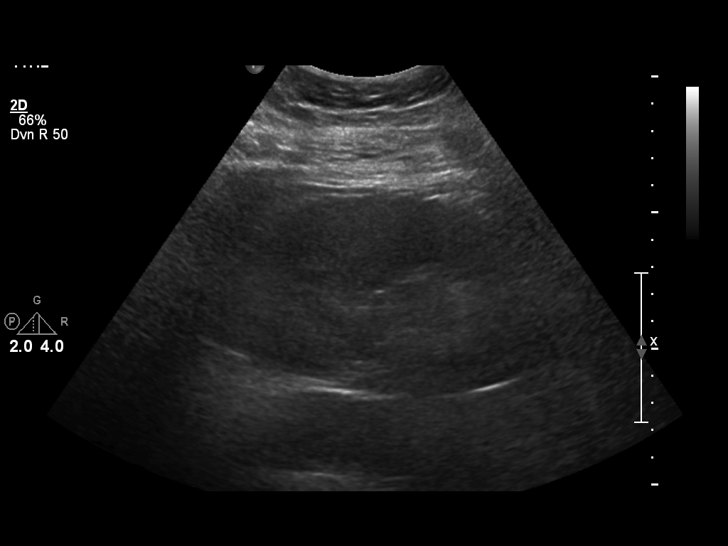
[im 38/71]
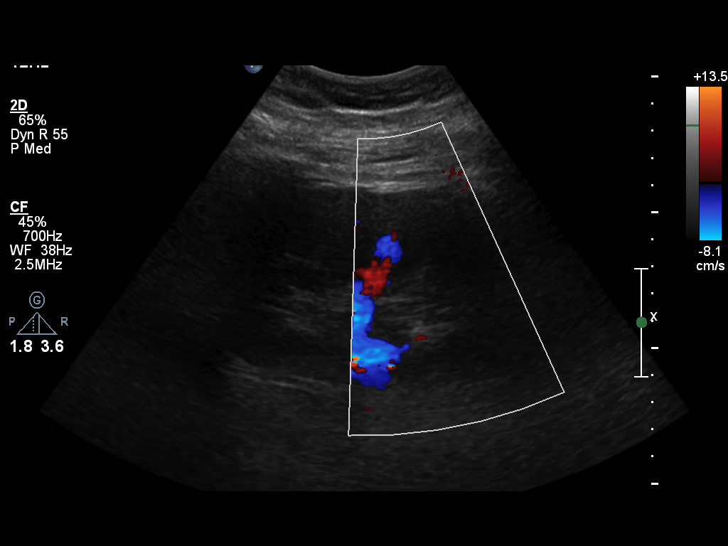
[im 44/71]
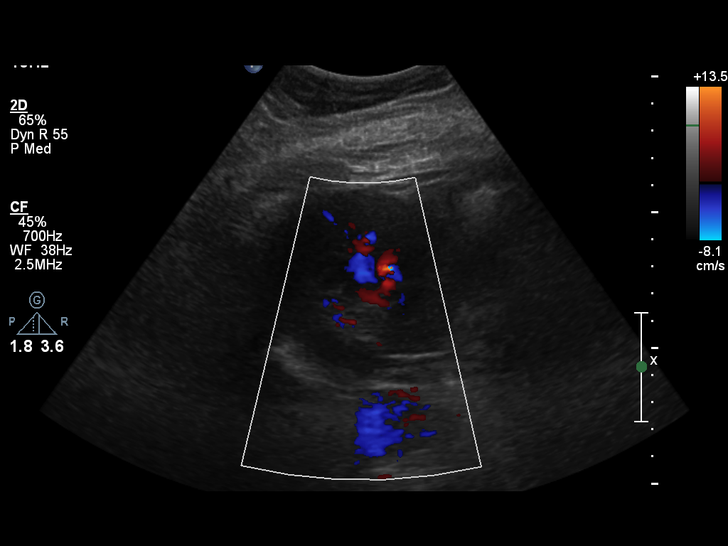
[im 47/71]
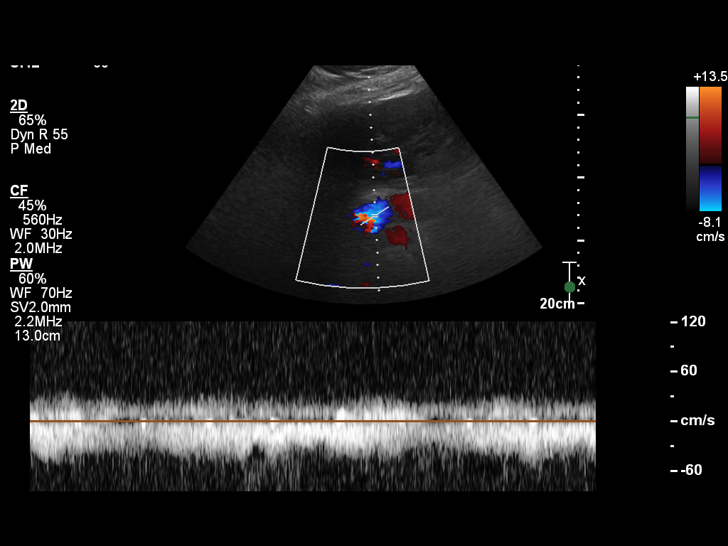
[im 53/71]
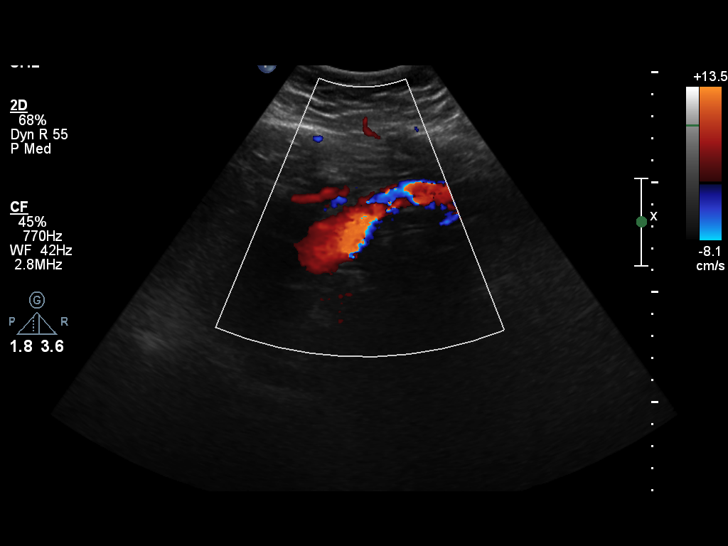
[im 59/71]
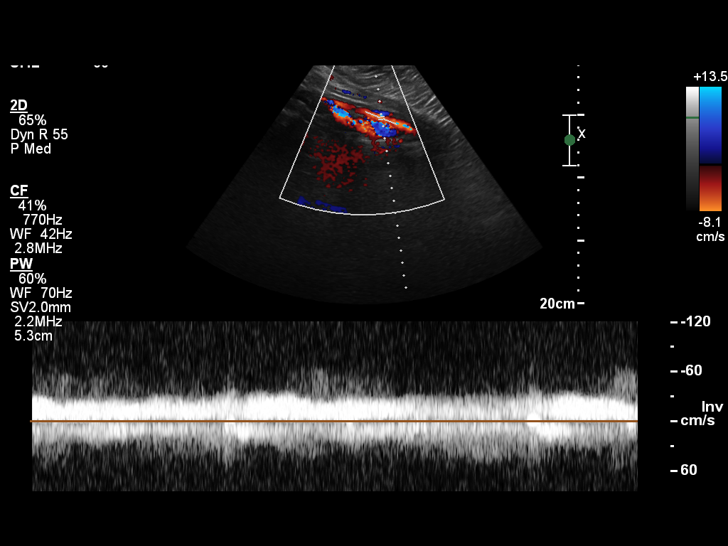
[im 65/71]
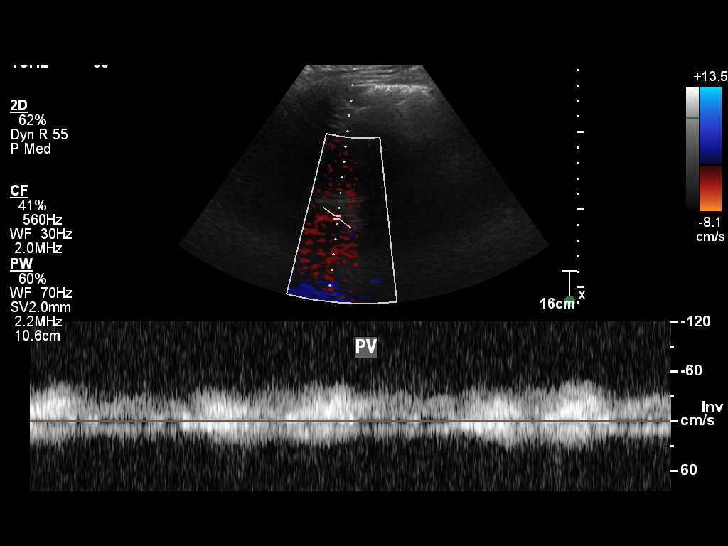
[im 71/71]
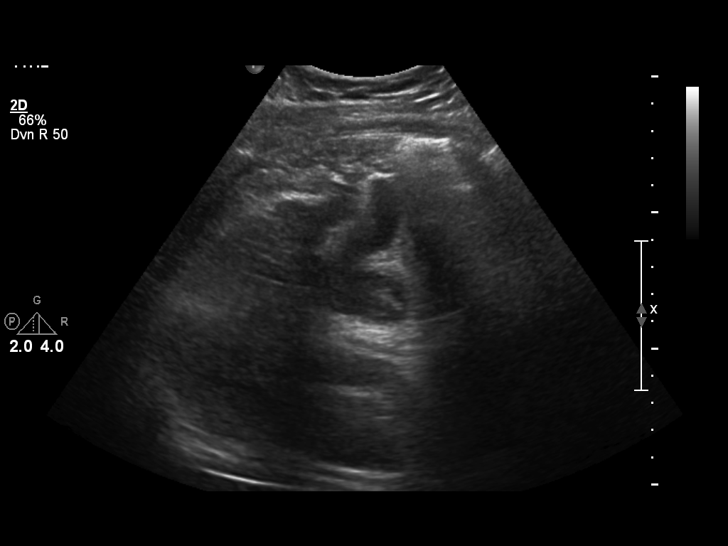

[14 of 25 positions shown; findings below may reference images not displayed]

FINDINGS: Liver: Coarse in echogenicity with nodular contour. Portal vein is patent.
Bile Ducts: No dilated intrahepatic biliary radicles. Common bile duct is not visualized.
Gallbladder: Cholelithiasis. The gallbladder is decompressed. No pericholecystic fluid. Negative Murphy's sign.
Pancreas: Obscured by overlying structures.
Right Kidney: Measures 13.6 cm. Normal echogenicity. No hydronephrosis.
Other: Recanalized umbilical vein.
IMPRESSION: Cirrhotic liver morphology. Please note routine HCC screening with MRI abdomen with contrast could be considered.
Recanalized umbilical vein.
Cholelithiasis. The gallbladder is decompressed without signs of acute cholecystitis.
Portable?
Yes

## 2022-04-08 IMAGING — CT CT CHEST PULMONARY EMBOLISM WITH IV CONTRAST
2 of 5 series · 13 of 36 positions shown · non-contrast
Comparison: None.

Abdominal pain with shortness of breath and elevated d-dimer
FINAL REPORT:
CT angiogram of the chest. [DATE], 0900 9000 hours
CLINICAL HISTORY: Pulmonary embolism (PE) suspected, positive D-dimer. Shortness of breath.
TECHNIQUE: Helical axial sections with sagittal and coronal reformats of the chest were obtained with intravenous contrast. Iterative reconstruction technique was employed to reduce patient radiation exposure. 3D/MIP reconstructed images were also provided.
Contrast Dose: No contrast dose details were provided at the time of interpretation.
Radiation Dose: Total exam DLP 2302.27 mGy/cm.

[Series 2: pe chest · axial · 0.78mm/px · z∈[-176,+54]mm · 10 of 114 slices shown]
[im 11/114  lung]
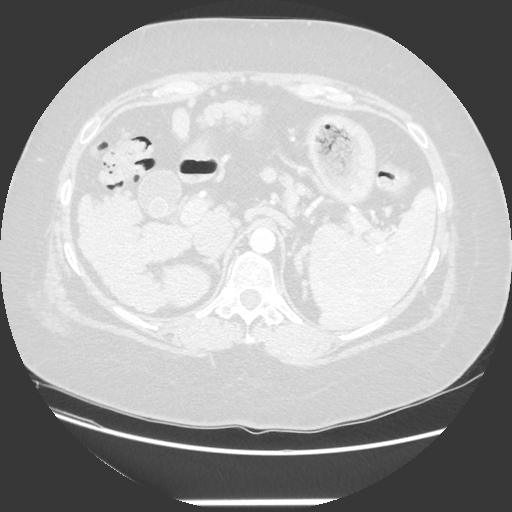
[im 21/114  mediastinal]
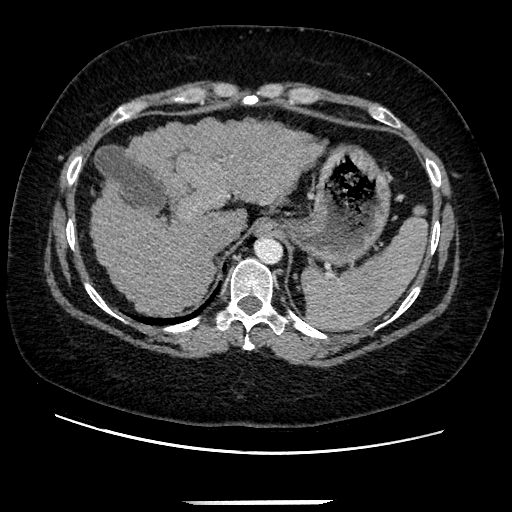
[im 31/114  lung]
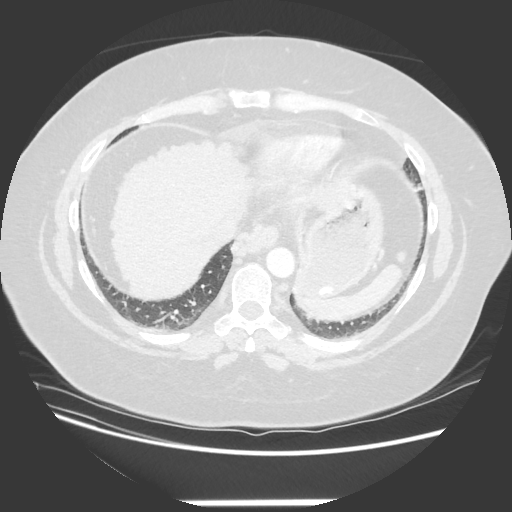
[im 42/114  mediastinal]
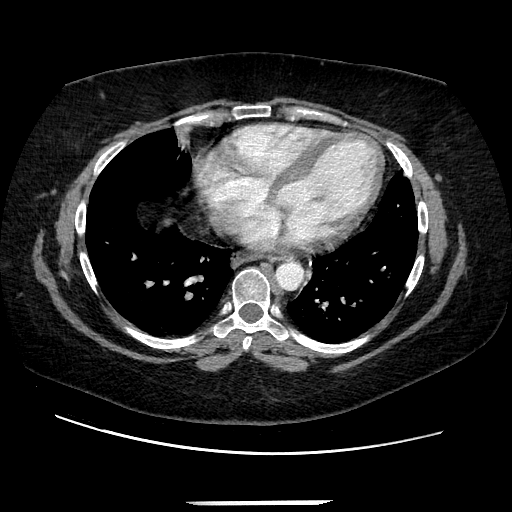
[im 52/114  lung]
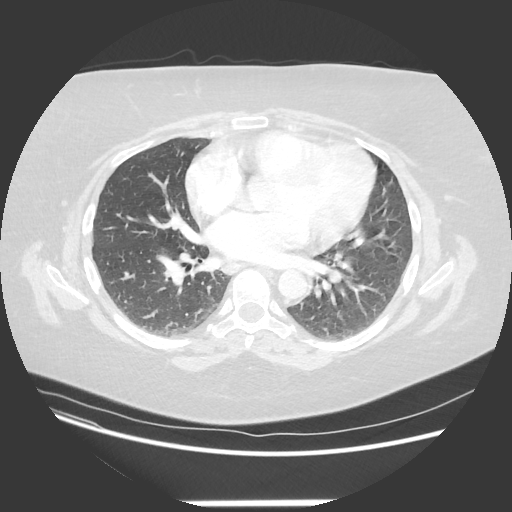
[im 62/114  mediastinal]
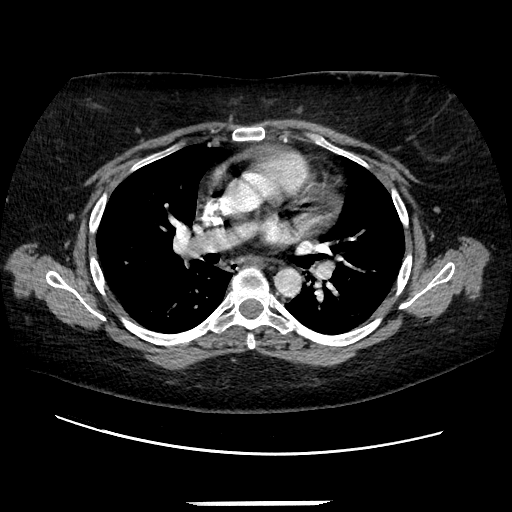
[im 72/114  lung]
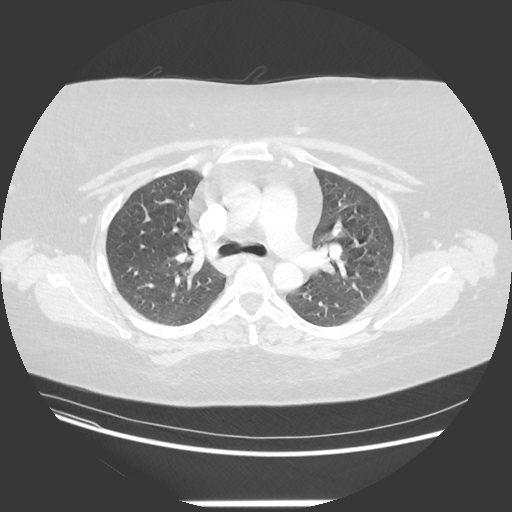
[im 83/114  mediastinal]
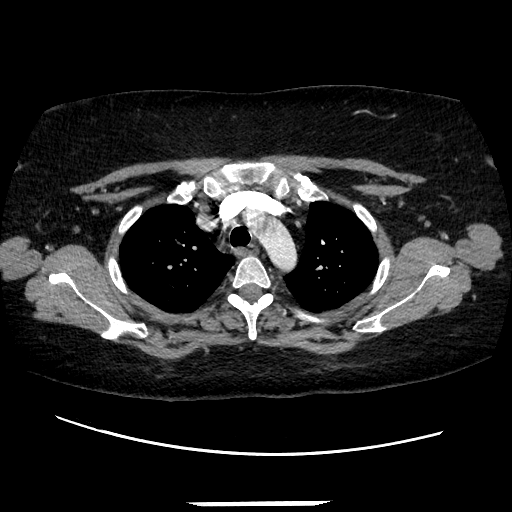
[im 93/114  lung]
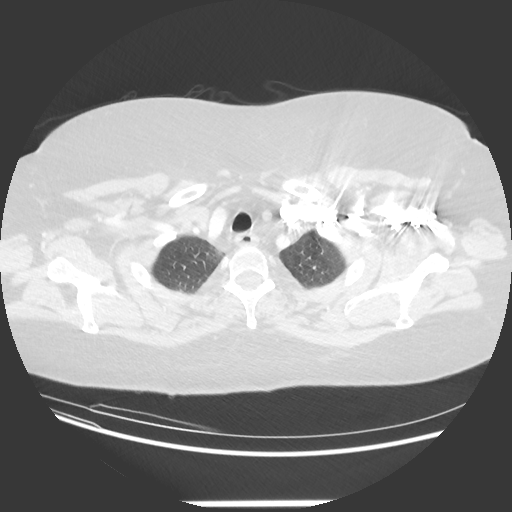
[im 103/114  mediastinal]
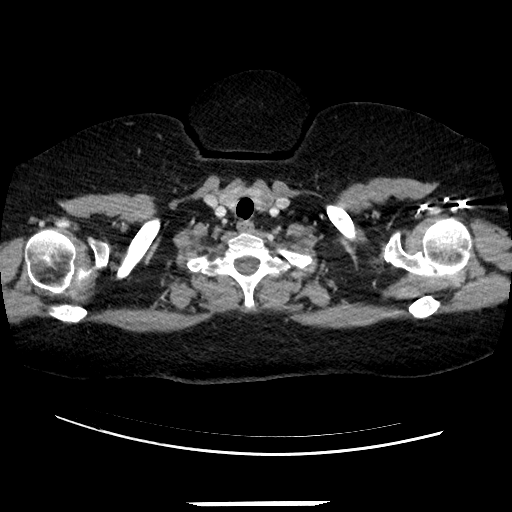

[Series 602: sag standard 2x2 · sagittal · 0.78mm/px · 3 of 200 slices shown]
[im 40/200  mediastinal]
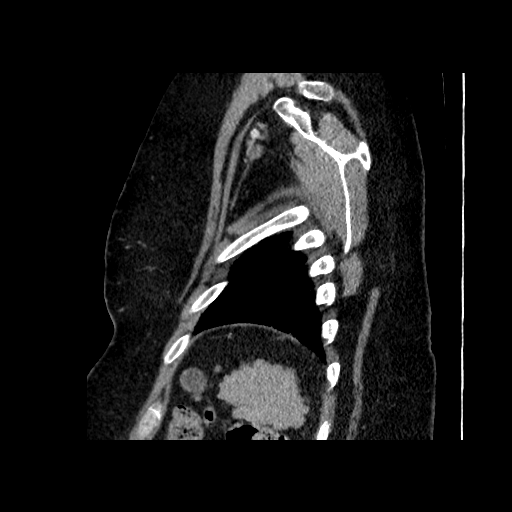
[im 80/200  mediastinal]
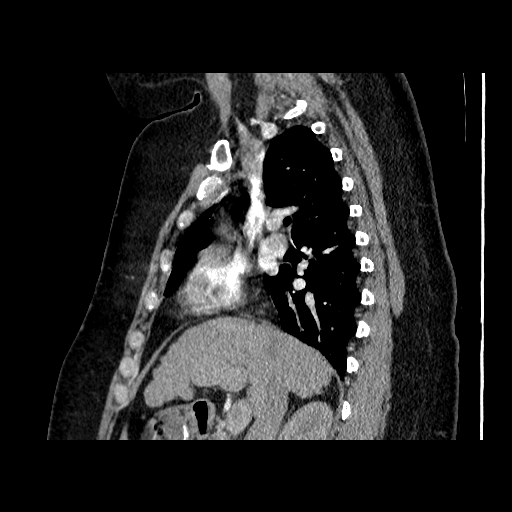
[im 120/200  mediastinal]
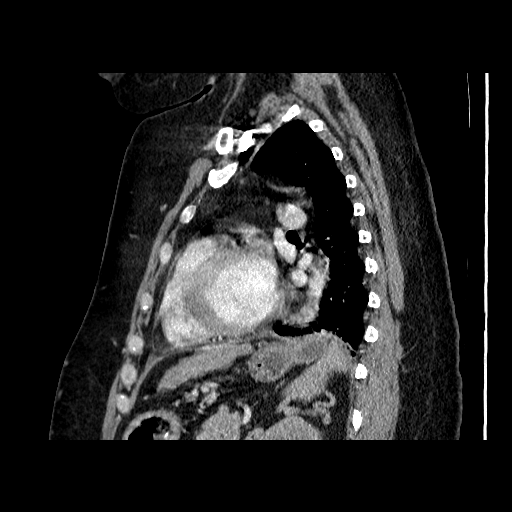

[13 of 36 positions shown; findings below may reference images not displayed]

FINDINGS: There is no evidence of pulmonary thromboembolism in the central pulmonary arteries. However, evaluation of the peripheral vessels is limited by respiratory motion artifacts and small peripheral emboli cannot be entirely excluded.
The mediastinum demonstrates no evidence of mass or lymphadenopathy. The thoracic aorta is unremarkable. There is no pericardial effusion.
Bibasilar atelectasis is seen. There is subsegmental atelectasis in the right middle lobe. No evidence of pleural effusion or pneumothorax.
There is a chronic depressed superior endplate fracture of T7 vertebral body.
IMPRESSION: 
IMPRESSION: 1. No evidence of pulmonary thromboembolism in the central pulmonary arteries. However, evaluation of the peripheral vessels is limited by respiratory motion artifacts and small peripheral emboli cannot be entirely excluded.
2. Other findings as described above.
Please also refer to report of CT abdomen and pelvis.
Is the patient pregnant?
Unknown

## 2022-04-08 IMAGING — CR XR CHEST 1 VIEW
1 series · 1 of 1 positions shown · non-contrast
Comparison: None available.

Shortness of breath
FINAL REPORT:
INDICATION: shortness of breath

[AP]
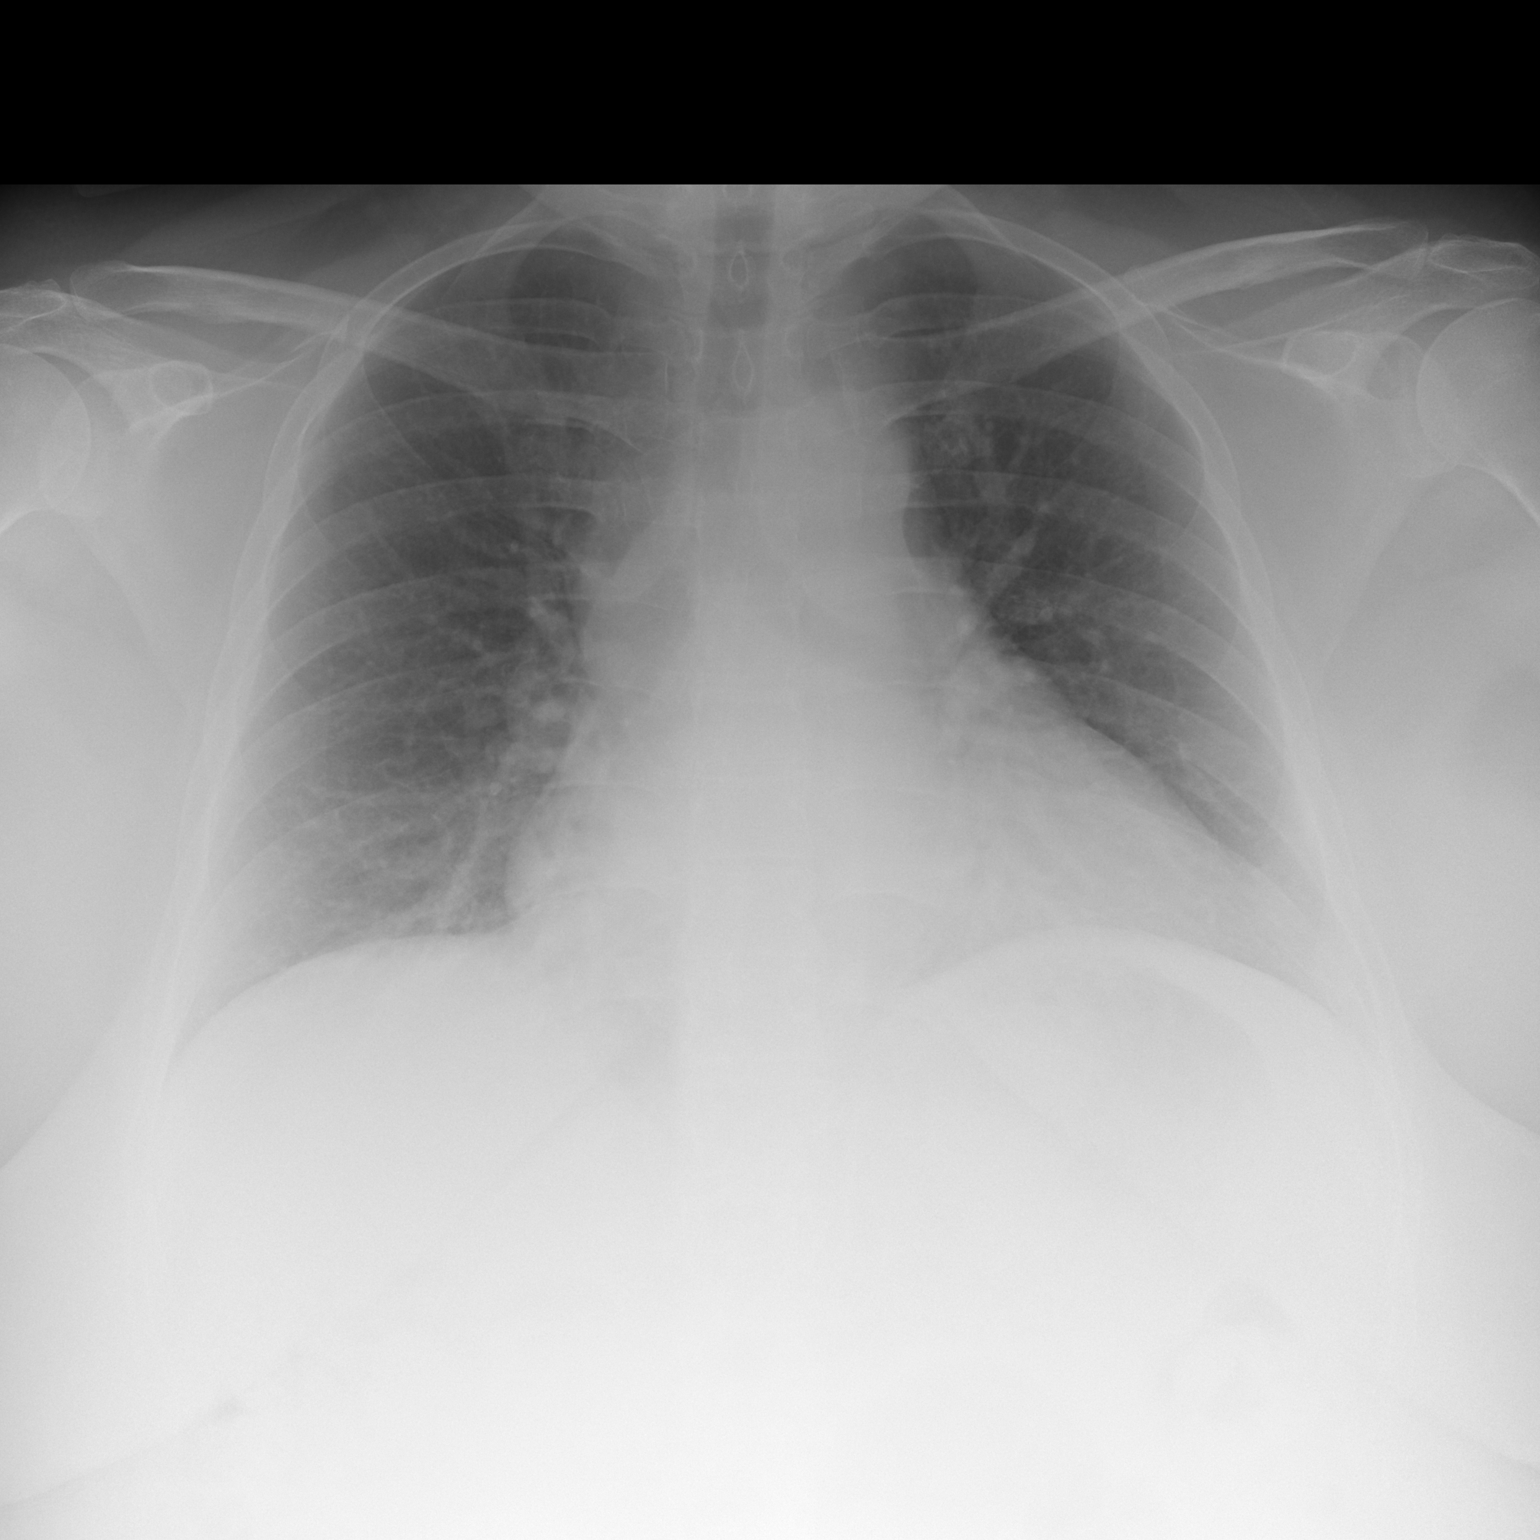

[1 of 1 positions shown; findings below may reference images not displayed]

FINDINGS: 1 view of the chest.
Lung volumes are low.
There is no consolidation, pleural effusion, or pneumothorax. Mild cardiomegaly. Visualized osseous structures are intact
IMPRESSION: 
IMPRESSION: No acute cardiopulmonary abnormality.
Is the patient pregnant?
Unknown

## 2022-04-08 IMAGING — CT CT ABDOMEN PELVIS WITH CONTRAST
2 of 3 series · 16 of 46 positions shown, 18 images · non-contrast
Comparison: None.

Abdominal pain with shortness of breath and elevated d-dimer
FINAL REPORT:
CT scan of the abdomen and pelvis. [DATE], 3833 8333 hours
CLINICAL HISTORY: Abdominal pain.
TECHNIQUE: Helical axial sections with sagittal and coronal reformats of the abdomen and pelvis were obtained with intravenous contrast. Iterative reconstruction technique was employed to reduce patient radiation exposure.
Contrast Dose: No contrast dose details were provided at the time of interpretation.
Radiation Dose: Total exam DLP 2302.27 mGy/cm.

[Series 3: abd/pel · axial · 0.93mm/px · z∈[-557,-92]mm · 13 of 214 slices shown, 15 images]
[im 14/214  soft-tissue]
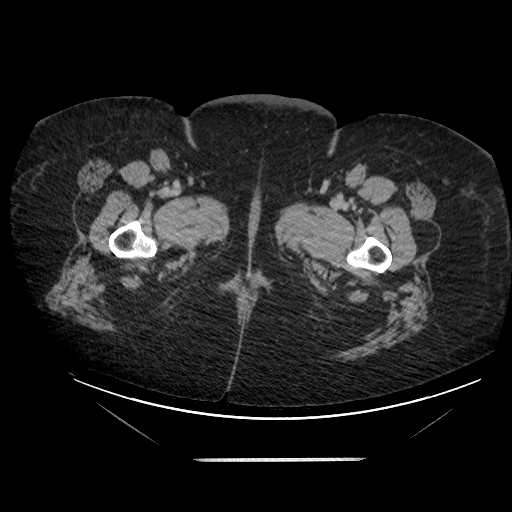
[im 14/214  bone]
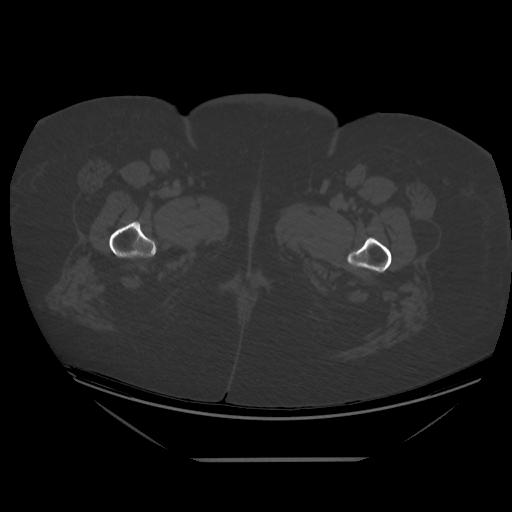
[im 28/214  soft-tissue]
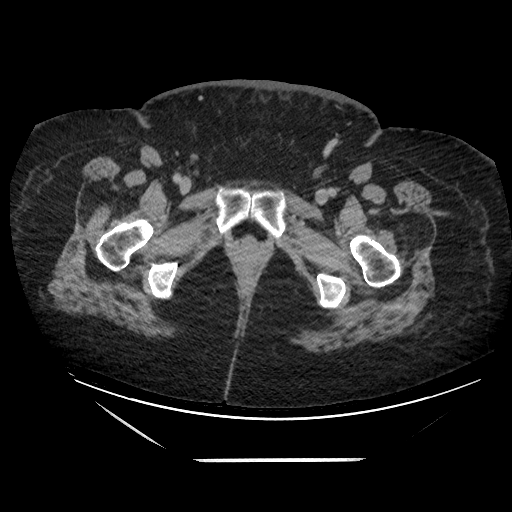
[im 42/214  soft-tissue]
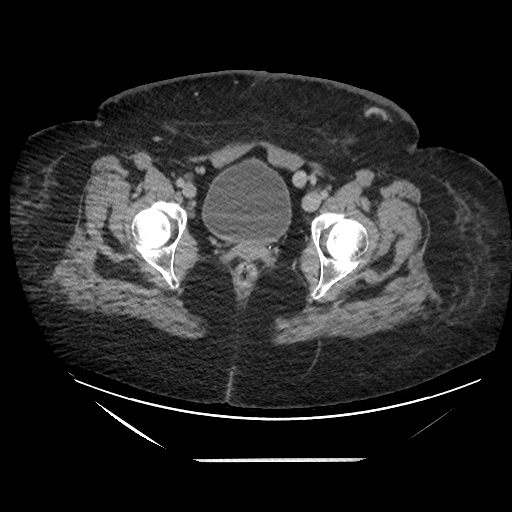
[im 62/214  soft-tissue]
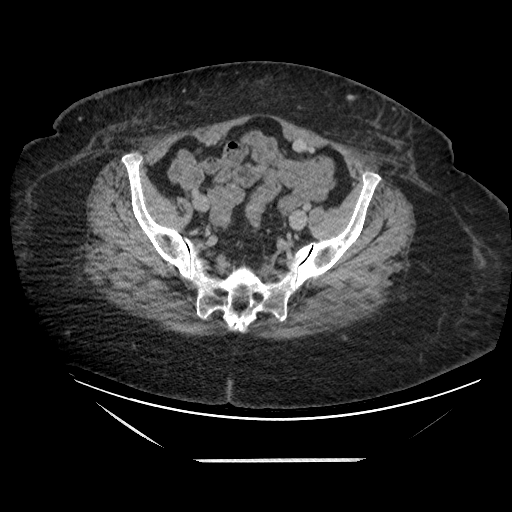
[im 76/214  soft-tissue]
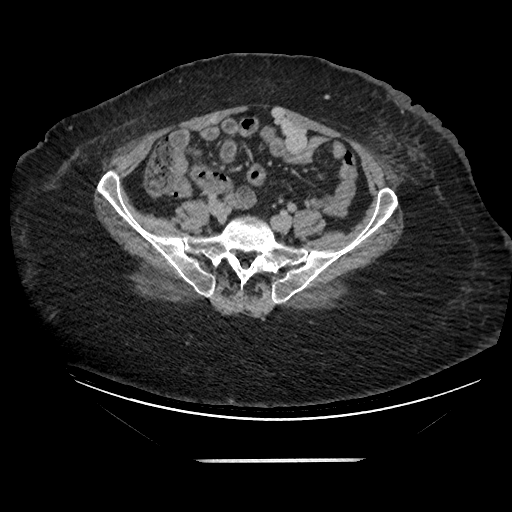
[im 90/214  soft-tissue]
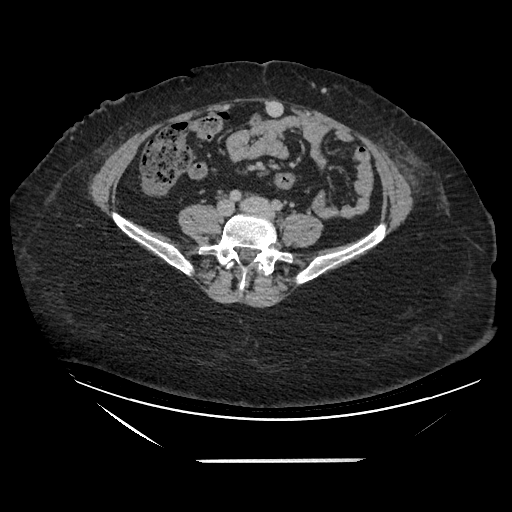
[im 110/214  soft-tissue]
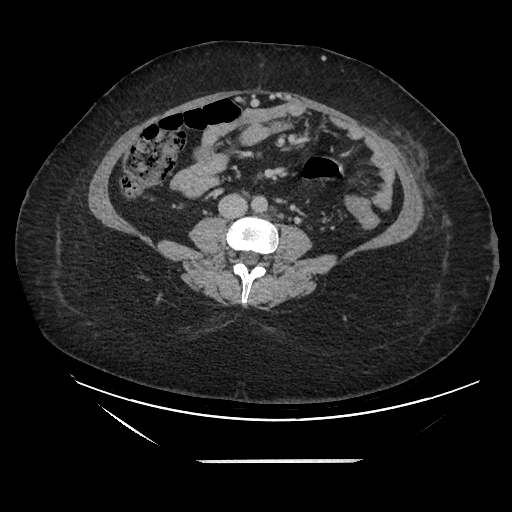
[im 124/214  soft-tissue]
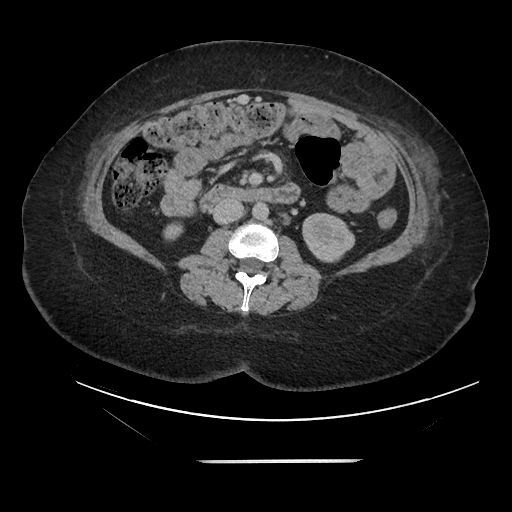
[im 138/214  soft-tissue]
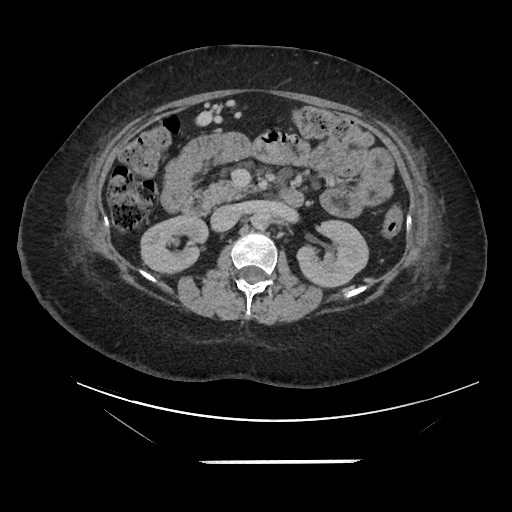
[im 138/214  bone]
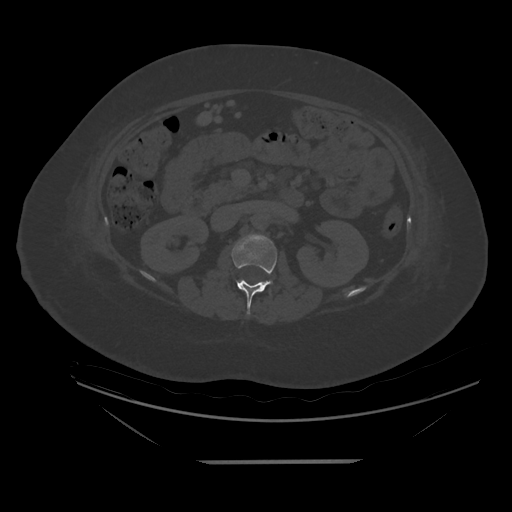
[im 152/214  soft-tissue]
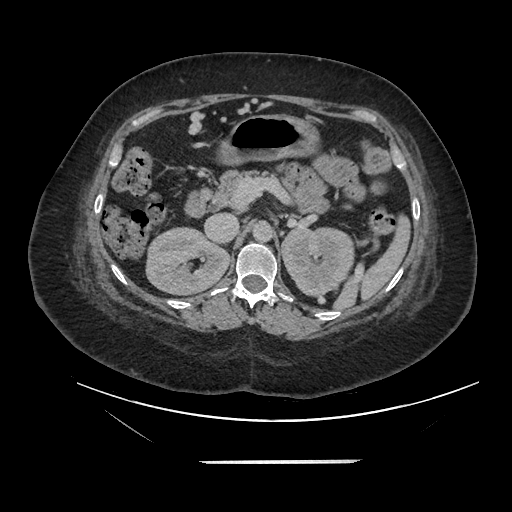
[im 172/214  soft-tissue]
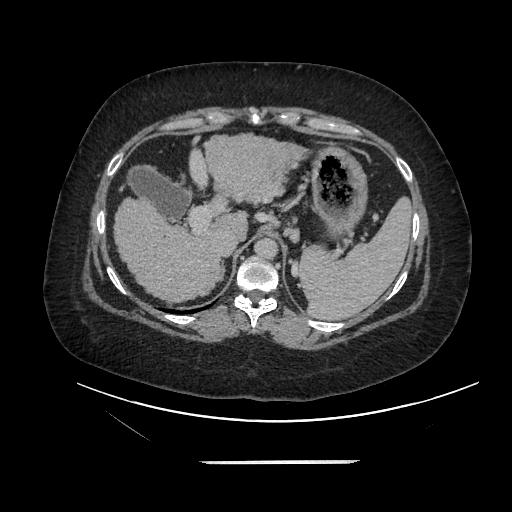
[im 186/214  soft-tissue]
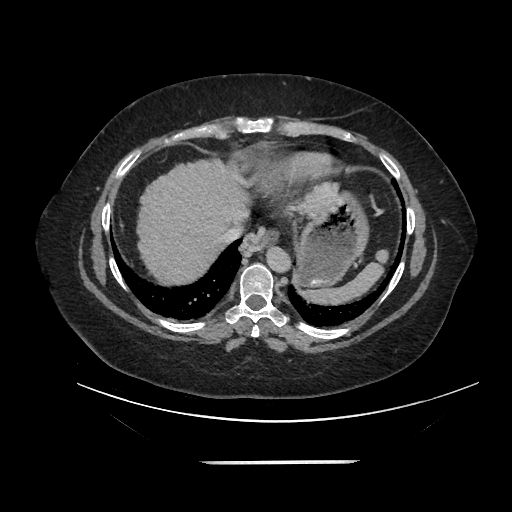
[im 200/214  soft-tissue]
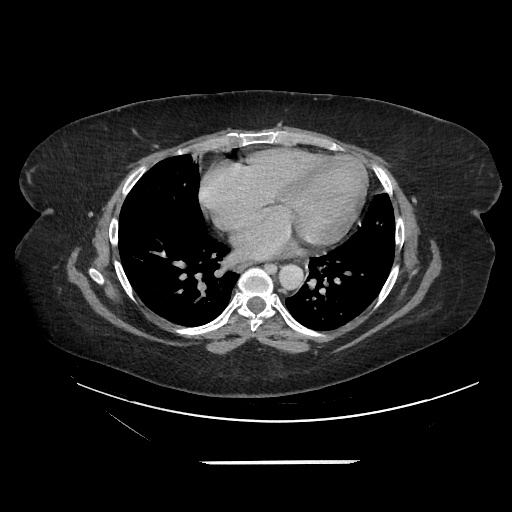

[Series 607: sag standard 2x2 · sagittal · 1.04mm/px · 3 of 234 slices shown]
[im 78/234  soft-tissue]
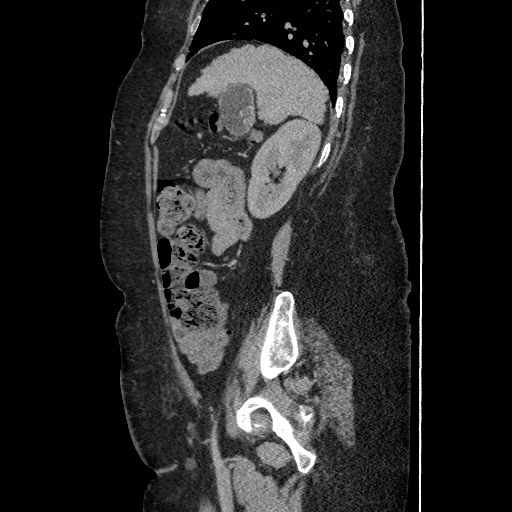
[im 104/234  soft-tissue]
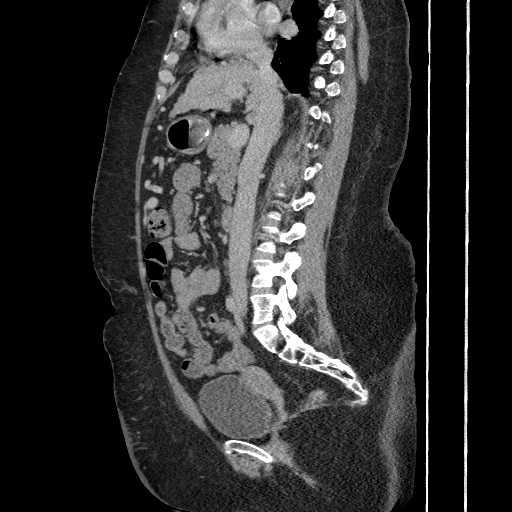
[im 130/234  soft-tissue]
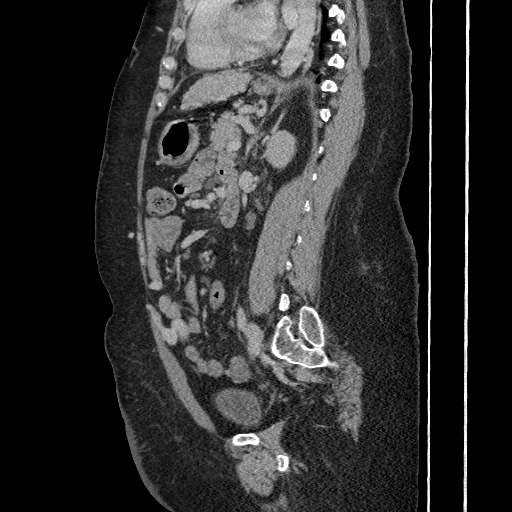

[16 of 46 positions shown; findings below may reference images not displayed]

FINDINGS: There is nodular contour of the liver, consistent with cirrhosis. The portal vein is dilated. There are multiple prominent collaterals in the upper abdomen with varices around the gastroesophageal junction. There is recanalized paraumbilical vein. Multiple calculi are noted within the gallbladder, without evidence of gallbladder wall thickening or pericholecystic fluid. The pancreas, spleen, kidneys and adrenals are unremarkable.
No evidence of bowel obstruction. The appendix is within normal limits (axial image 127-149/214).
The urinary bladder is partially distended and shows mild wall thickening; possibility of cystitis cannot be excluded. The uterus is unremarkable. There is no free fluid, free air or abscess.
The osseous structures are unremarkable.
Please note that evaluation of bowel loops is limited due to absence of oral contrast.
IMPRESSION: 
IMPRESSION: 1. No evidence of bowel obstruction.
2. Hepatic cirrhosis with multiple prominent collaterals and varices around the gastroesophageal junction.
3. Cholelithiasis. Suggest further evaluation with sonography, if clinically indicated.
4. Partially distended urinary bladder with mild wall thickening; possibility of cystitis cannot be excluded.
5. Other findings as described above.
Suggest clinical correlation and follow up accordingly.
Please also refer to report of CT chest.
All CT scans at this facility use iterative reconstruction technique, dose modulation, and/or weight based dosing when appropriate to reduce radiation dose to as low as reasonably achievable.

## 2022-04-12 IMAGING — CR XR CHEST 1 VIEW
1 series · 1 of 1 positions shown · non-contrast
Comparison: Chest x-ray from 04/08/2022

Difficulty breathing
FINAL REPORT:
INDICATION: difficulty breathing

[AP]
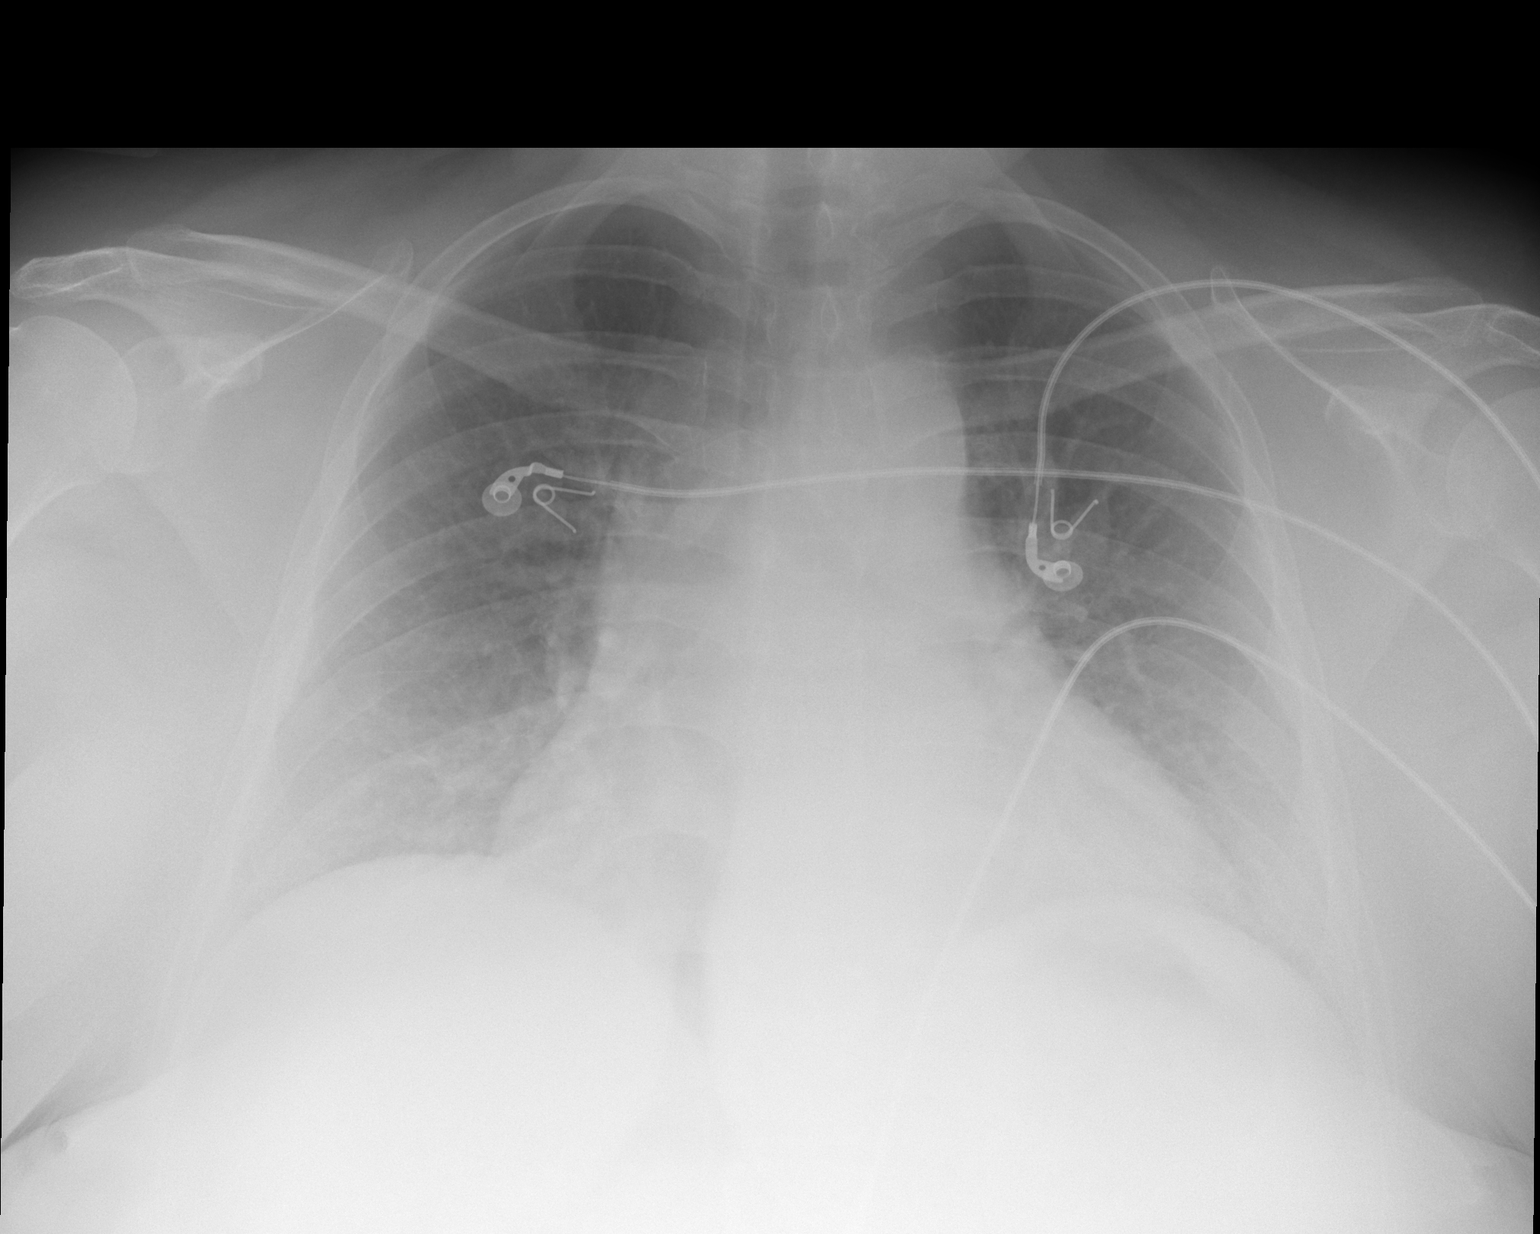

[1 of 1 positions shown; findings below may reference images not displayed]

FINDINGS: 1 view of the chest. The cardiomediastinal contours are within normal limits. No evidence of focal consolidation, pleural effusion, pulmonary edema, or pneumothorax.
IMPRESSION: 
IMPRESSION: No acute cardiopulmonary findings.
Is the patient pregnant?
No

## 2022-04-13 IMAGING — CT CT ABDOMEN PELVIS WITH CONTRAST
2 of 3 series · 17 of 46 positions shown, 19 images · IV contrast (agent unspecified)
Comparison: none

Bilateral flank pain with upper abd pain and swelling.
Ovary sx
FINAL REPORT:
TECHNIQUE: CT abdomen and pelvis with IV contrast, with multiplanar reconstructions.
All CT scans at this facility use dose modulation and/or weight based dosing when appropriate to reduce radiation dose to as low as reasonably achievable. (#[id number][DATE], 4747 7717 hours
CLINICAL HISTORY: Abdominal pain, acute, nonlocalized
Compared with the prior study dated April 08, 2022

[Series 2: abd/pel · axial · 0.95mm/px · z∈[-527,-50]mm · 14 of 221 slices shown, 16 images]
[im 15/221  soft-tissue]
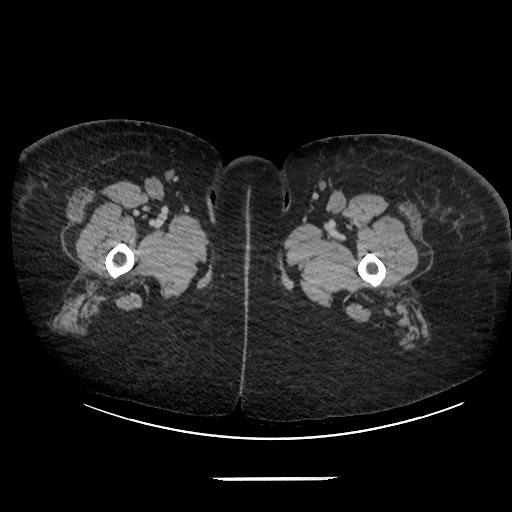
[im 15/221  bone]
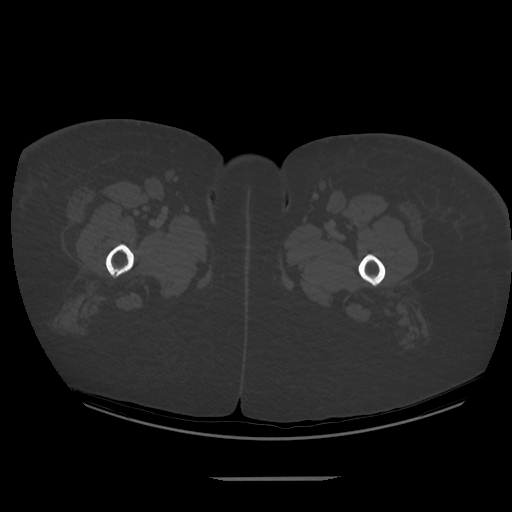
[im 29/221  soft-tissue]
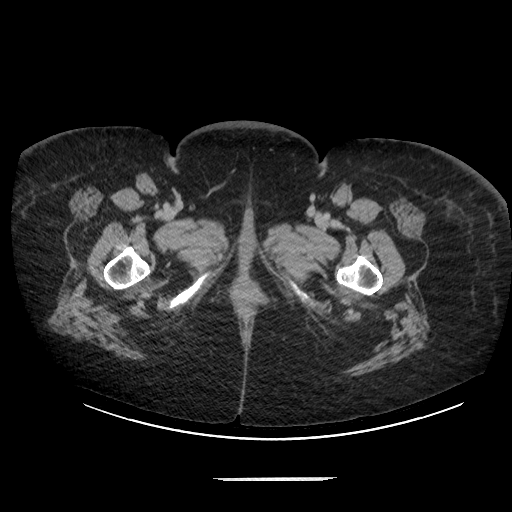
[im 43/221  soft-tissue]
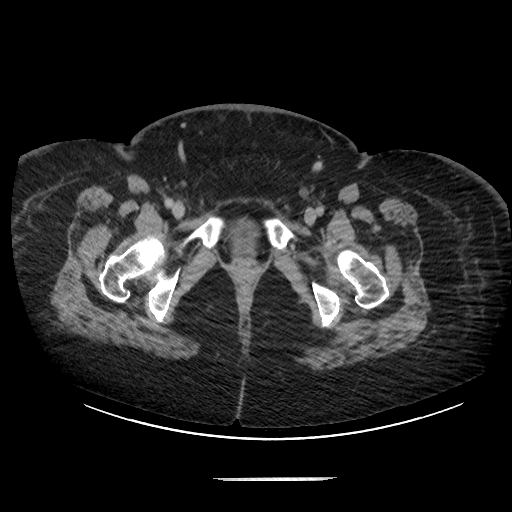
[im 57/221  soft-tissue]
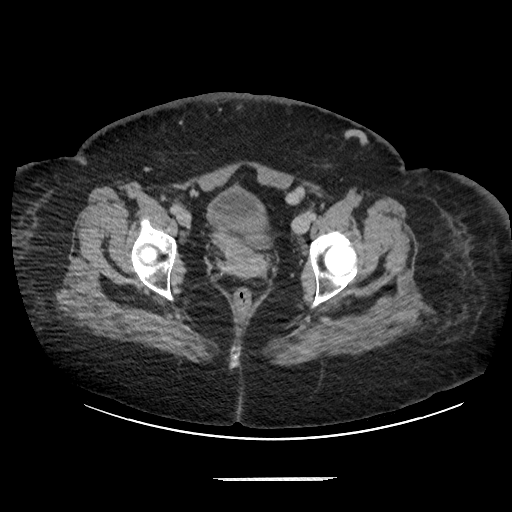
[im 71/221  soft-tissue]
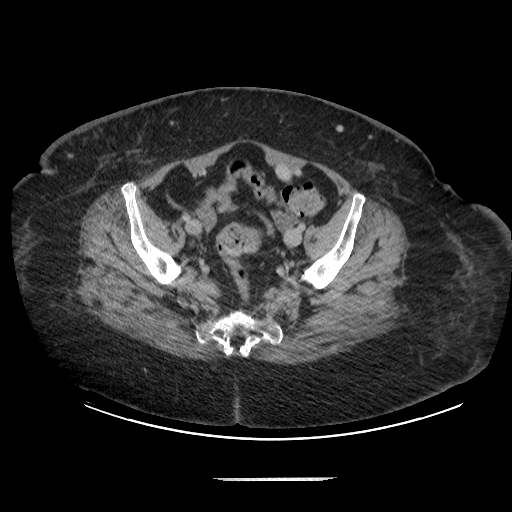
[im 86/221  soft-tissue]
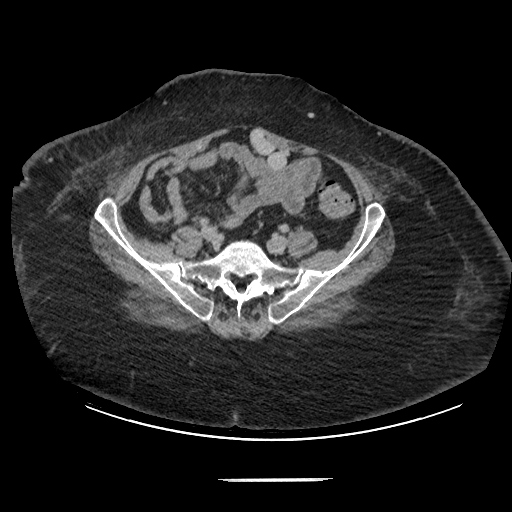
[im 100/221  soft-tissue]
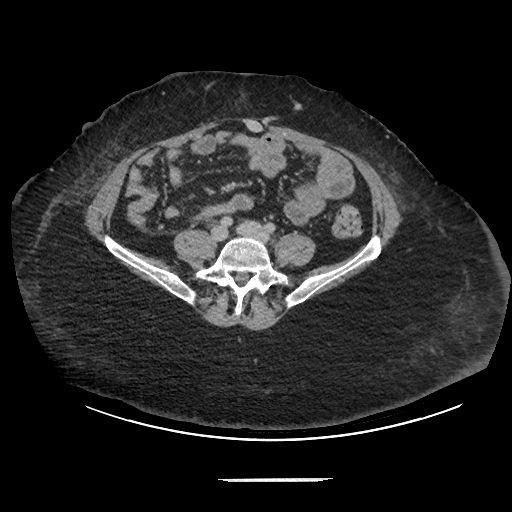
[im 121/221  soft-tissue]
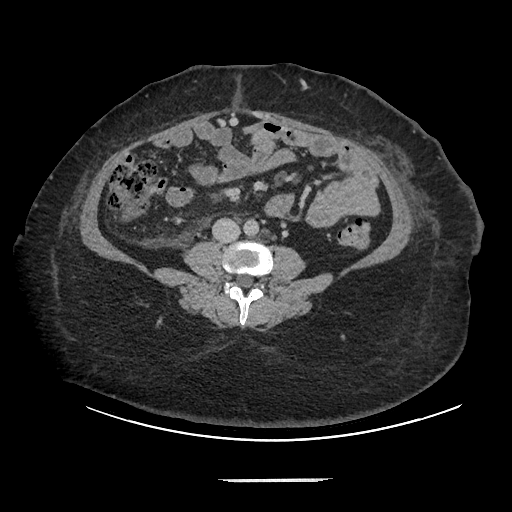
[im 135/221  soft-tissue]
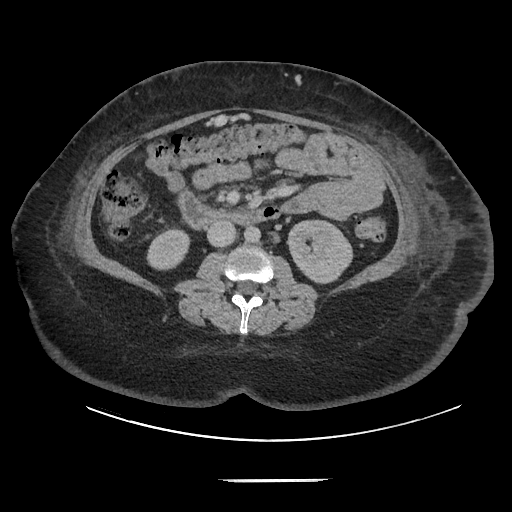
[im 135/221  bone]
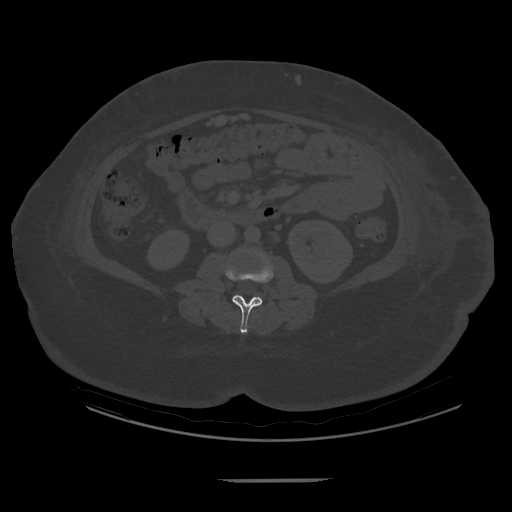
[im 150/221  soft-tissue]
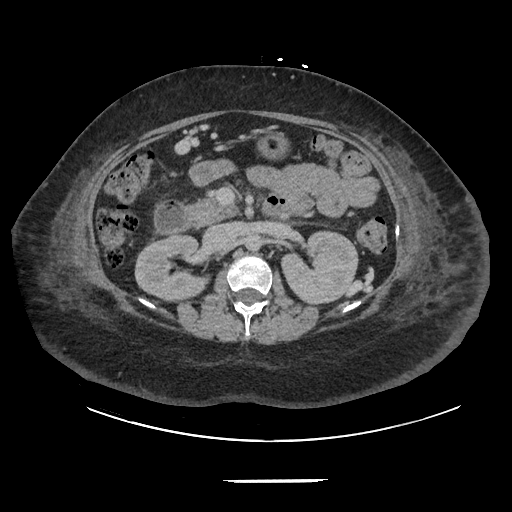
[im 164/221  soft-tissue]
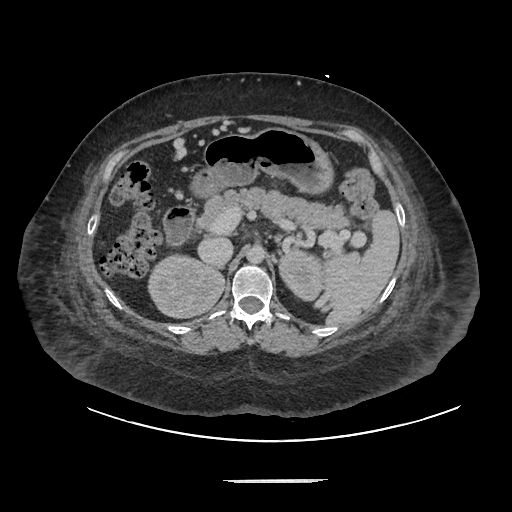
[im 178/221  soft-tissue]
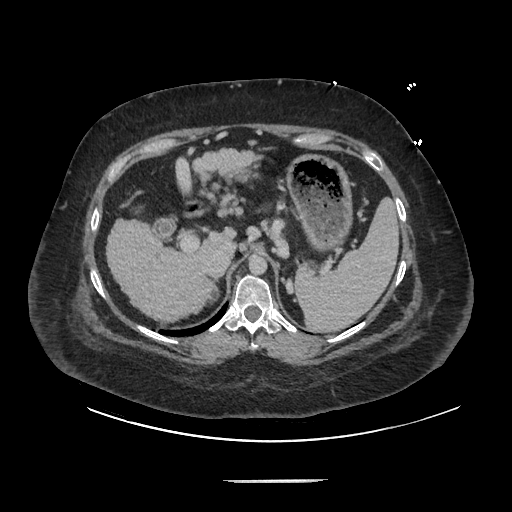
[im 192/221  soft-tissue]
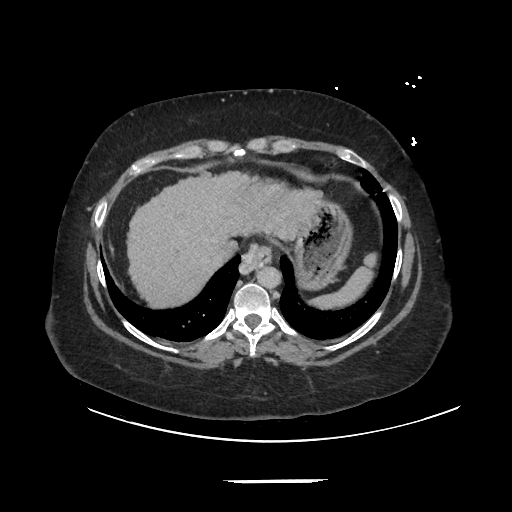
[im 206/221  soft-tissue]
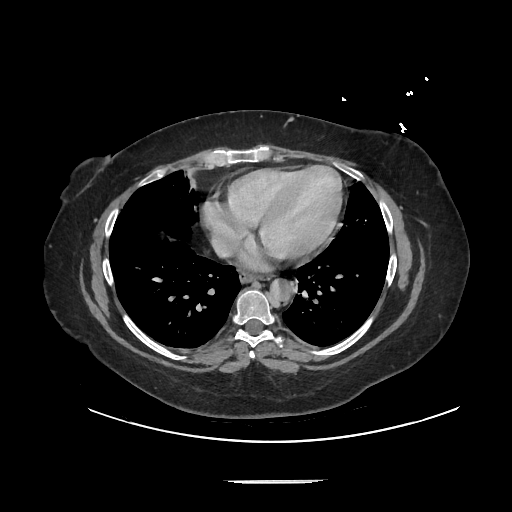

[Series 602: sag standard 2x2 · sagittal · 1.08mm/px · 3 of 240 slices shown]
[im 80/240  soft-tissue]
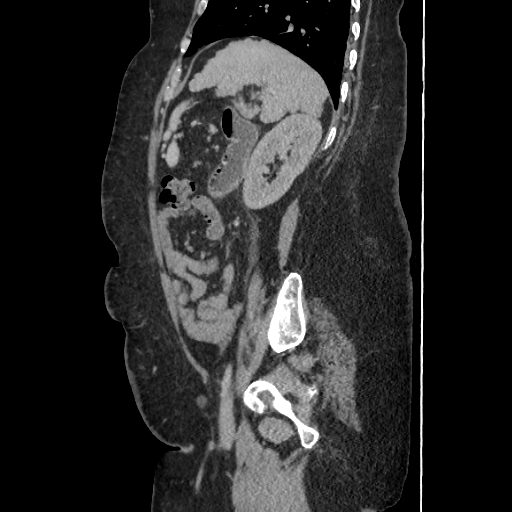
[im 107/240  soft-tissue]
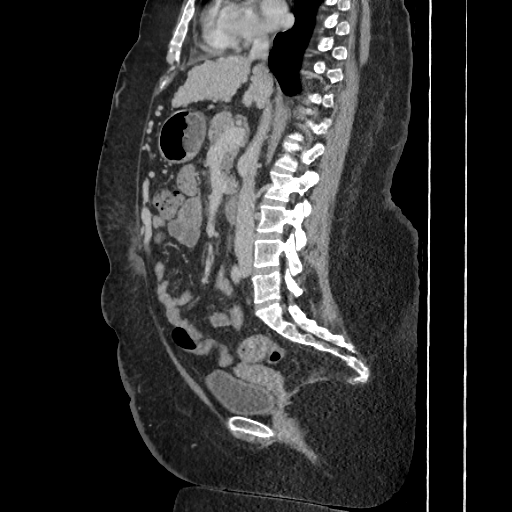
[im 133/240  soft-tissue]
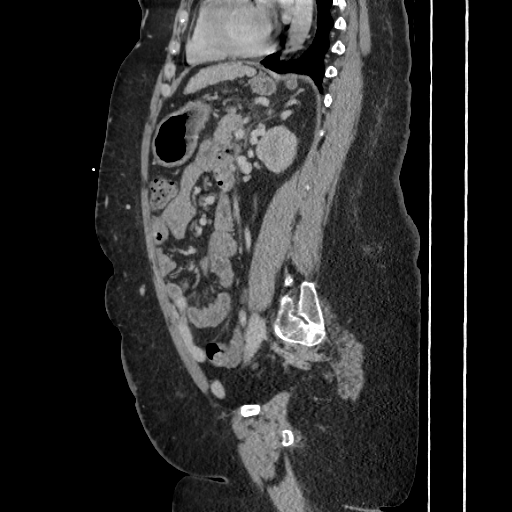

[17 of 46 positions shown; findings below may reference images not displayed]

FINDINGS: Bibasilar streaky atelectasis is present.
There is nodular contour of the liver, consistent with cirrhosis. The portal vein is dilated. There are multiple prominent collaterals in the upper abdomen with varices around the gastroesophageal junction. There is recanalized paraumbilical vein. Multiple calculi are noted within the gallbladder, without evidence of gallbladder wall thickening or pericholecystic fluid.The spleen, pancreas, kidneys and adrenals are unremarkable. There is mild wall thickening of the small bowel loops.
No evidence of bowel dilatation. The appendix is within normal limits.
The urinary bladder is unremarkable. There is no free air. Trace free fluid is seen in the abdomen. Subcutaneous fat stranding is seen, increased since the prior examination.Prominent mesenteric and retroperitoneal lymph nodes are seen.
The osseous structures are unremarkable.
IMPRESSION: 
IMPRESSION: 1.  Hepatic cirrhosis with multiple prominent collaterals and varices around the gastroesophageal junction.
2.  Anasarca, mildly increased since the prior examination. Trace ascites.
3.  Findings suggestive of portal hypertensive enteropathy.
4.  Cholelithiasis/gallbladder polyps.
The images in this study were reviewed independently by myself. The findings are concordant with the preliminary dictation provided by [facility].
Is the patient pregnant?
No

## 2022-05-11 IMAGING — CR XR CHEST 1 VIEW
1 series · 1 of 1 positions shown · non-contrast
Comparison: 04/12/2022

Chest pain, shortness of breath
FINAL REPORT:
INDICATION: chest pain

[AP]
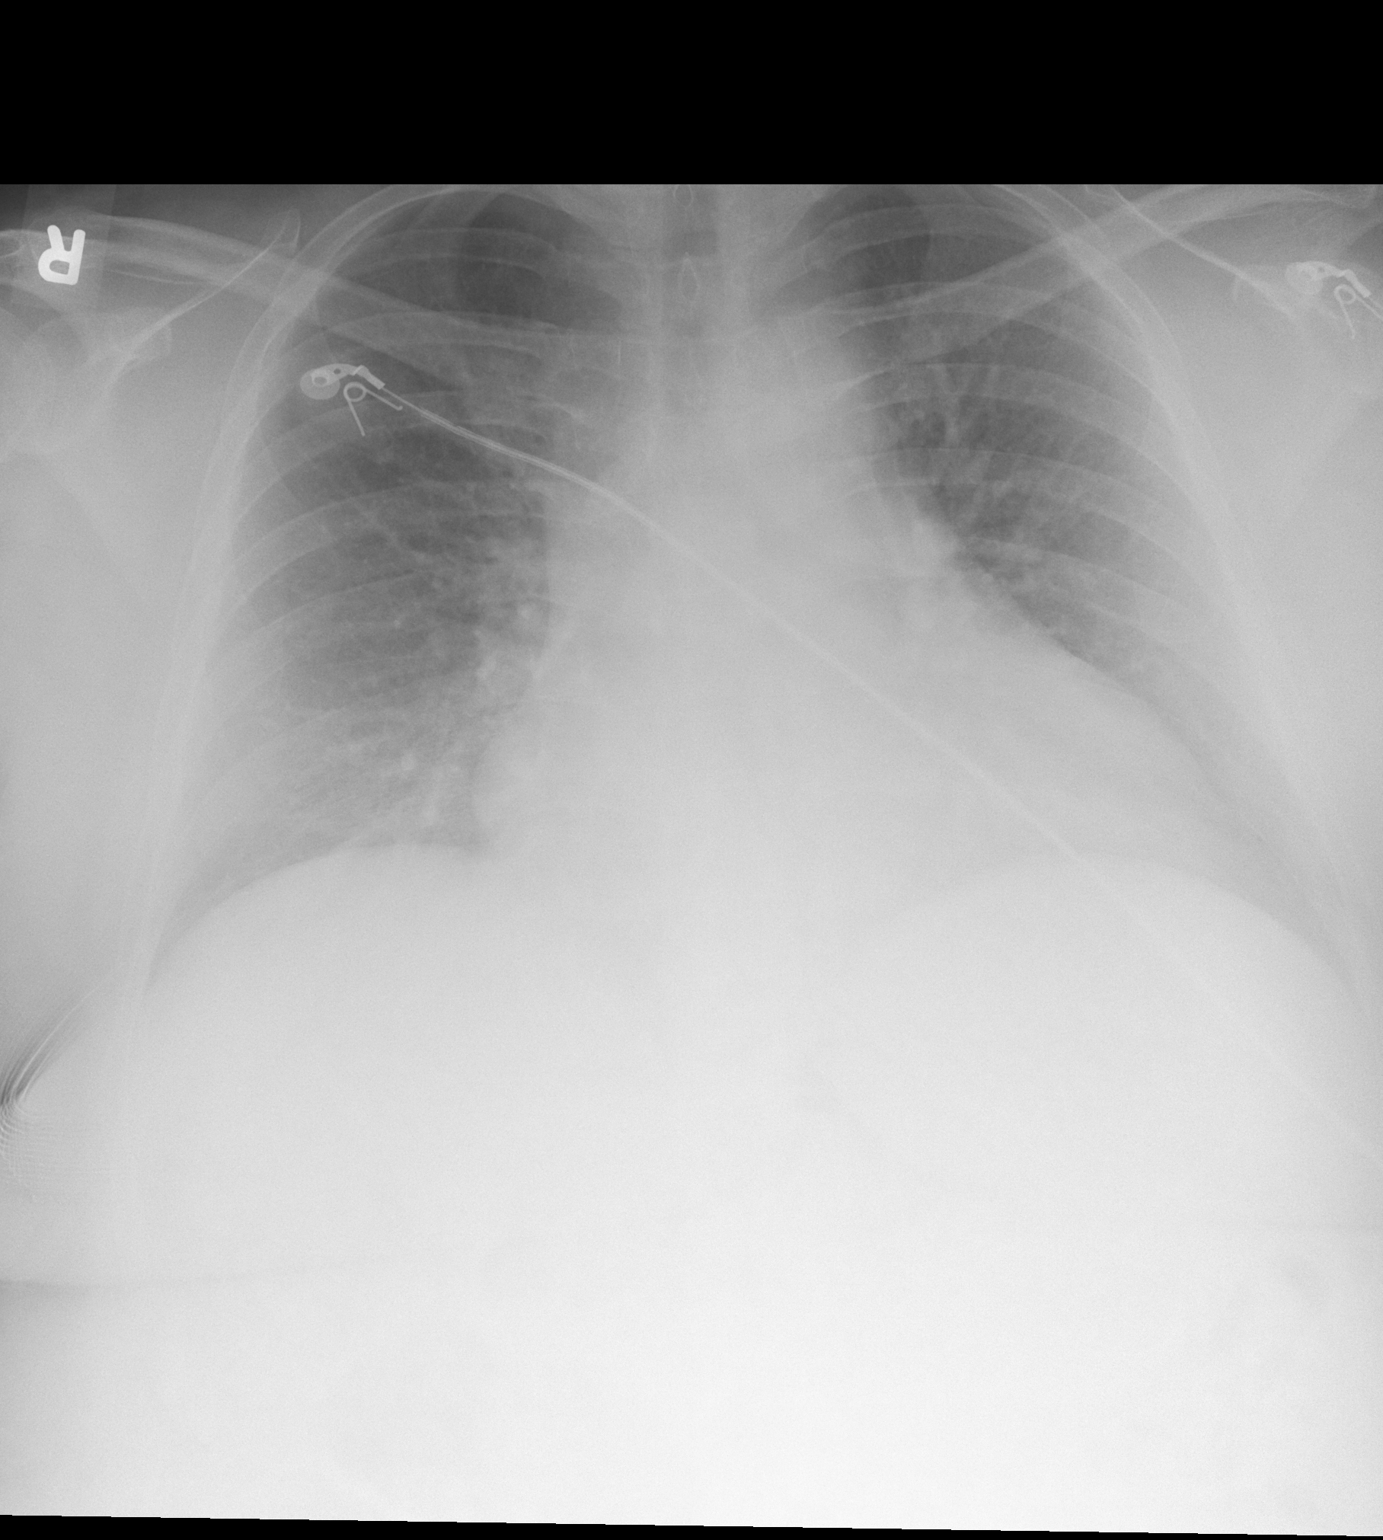

[1 of 1 positions shown; findings below may reference images not displayed]

FINDINGS: 1 view of the chest. Mild cardiomegaly stable. No evidence of focal consolidation, pleural effusion, pulmonary edema, or pneumothorax.
IMPRESSION: 
IMPRESSION: Mild cardiomegaly.
Is the patient pregnant?
No

## 2022-05-11 IMAGING — CT CT ABDOMEN PELVIS WITHOUT CONTRAST
2 of 3 series · 16 of 46 positions shown, 18 images · non-contrast
Comparison: [DATE]st 0508

Chest pain, SOB, bi lateral flank pain.
FINAL REPORT:
CT scan of the abdomen and pelvis. [DATE], 7175 1151 hours
CLINICAL HISTORY: Bilateral flank pain
TECHNIQUE: Helical axial sections with sagittal and coronal reformats of the abdomen and pelvis were obtained with intravenous contrast. Iterative reconstruction technique was employed to reduce patient radiation exposure.
Contrast Dose: Not available.
Radiation Dose: Total exam DLP 2492.61 mGy/cm.

[Series 3: abd/pel · axial · 0.96mm/px · z∈[-618,-146]mm · 13 of 219 slices shown, 15 images]
[im 15/219  soft-tissue]
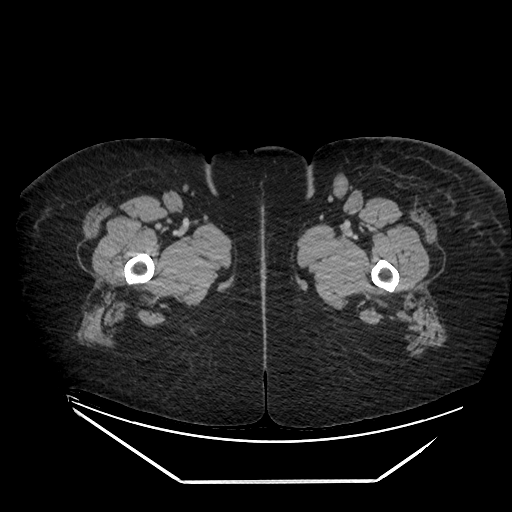
[im 15/219  bone]
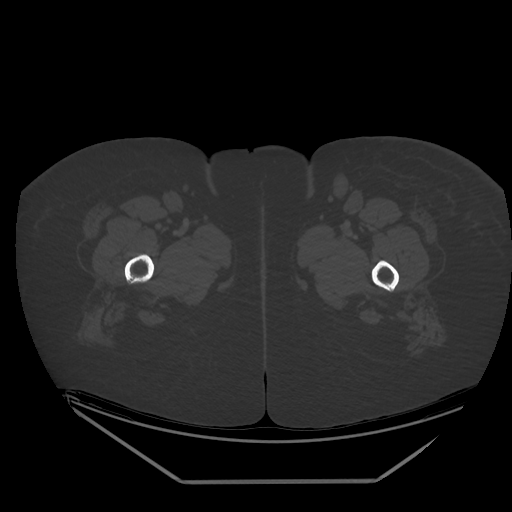
[im 29/219  soft-tissue]
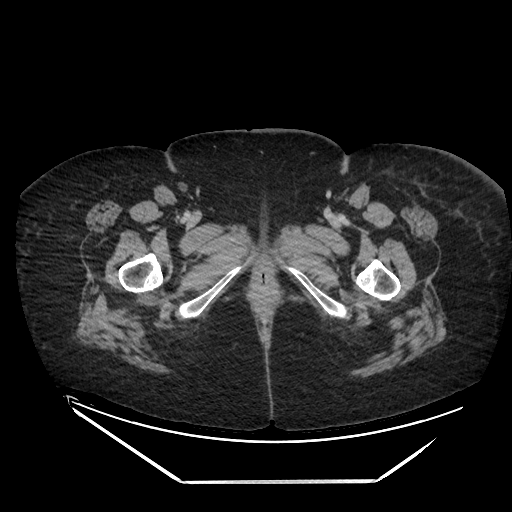
[im 43/219  soft-tissue]
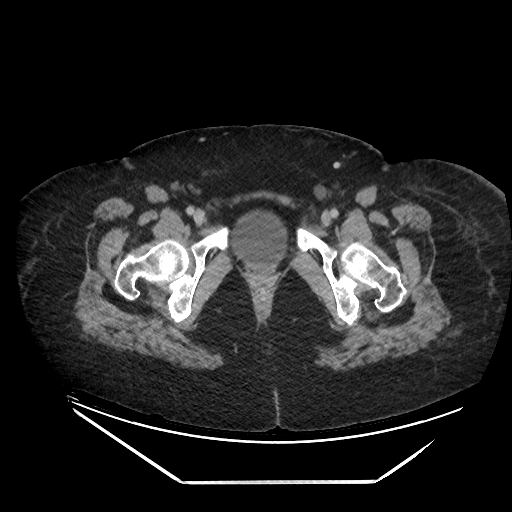
[im 64/219  soft-tissue]
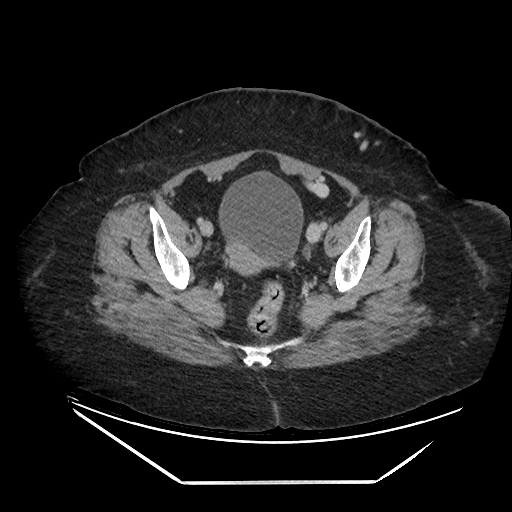
[im 78/219  soft-tissue]
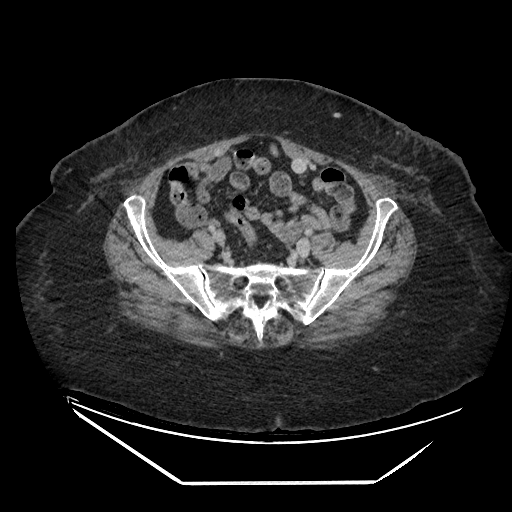
[im 92/219  soft-tissue]
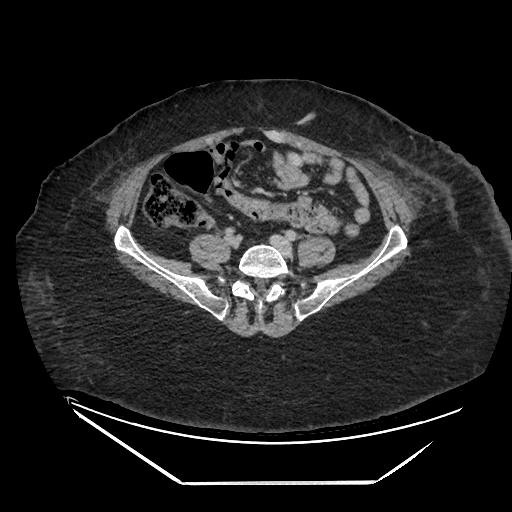
[im 113/219  soft-tissue]
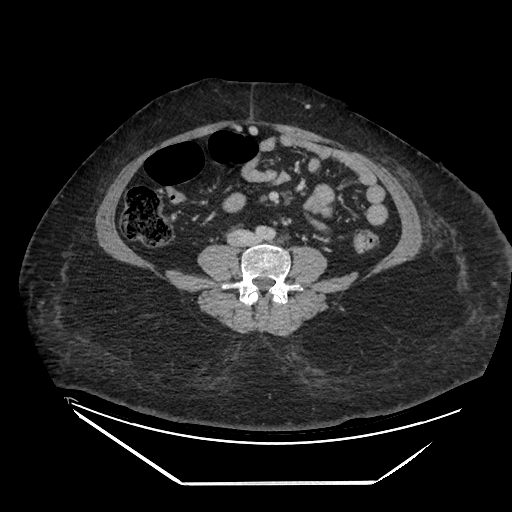
[im 127/219  soft-tissue]
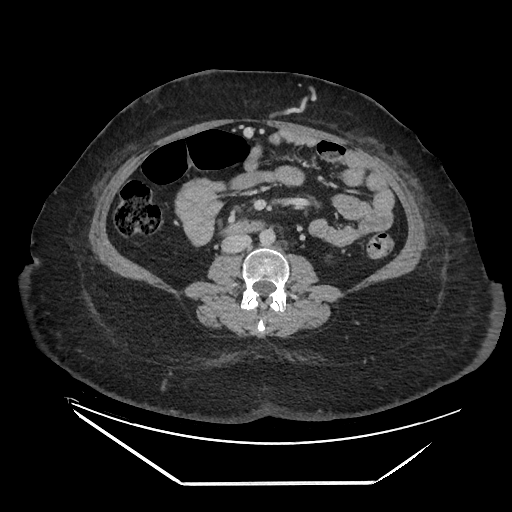
[im 141/219  soft-tissue]
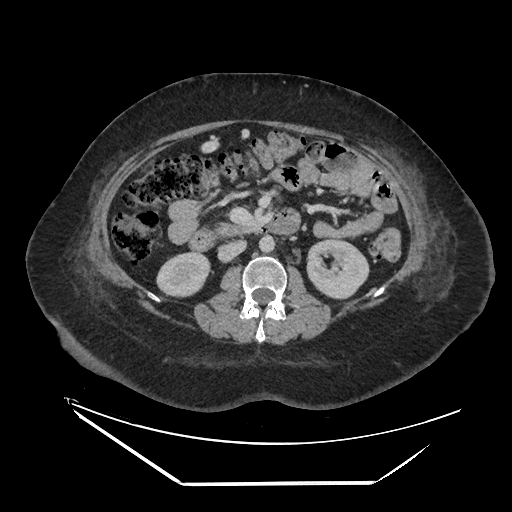
[im 141/219  bone]
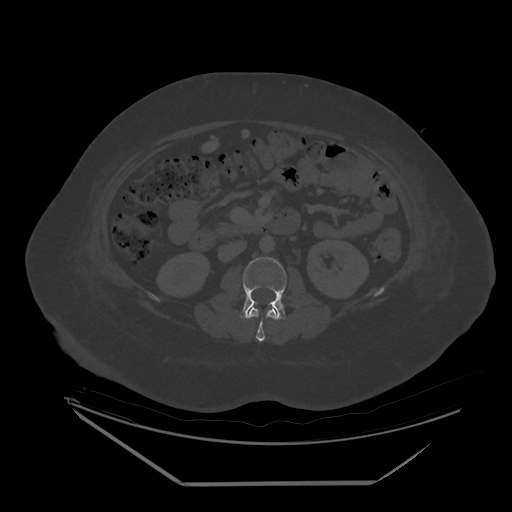
[im 155/219  soft-tissue]
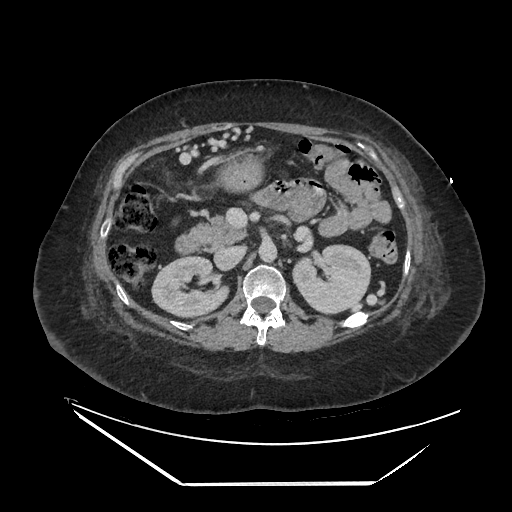
[im 176/219  soft-tissue]
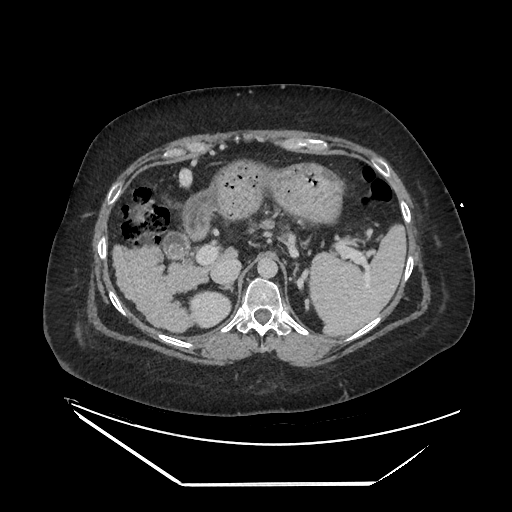
[im 190/219  soft-tissue]
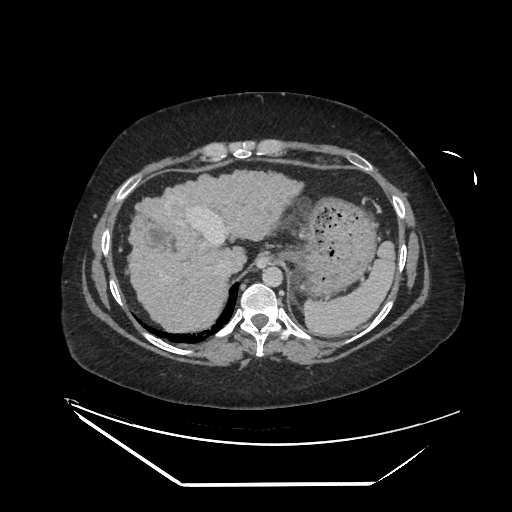
[im 204/219  soft-tissue]
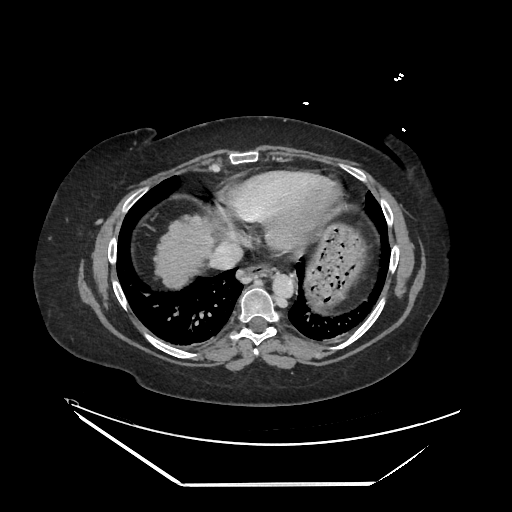

[Series 607: sag standard 2x2 · sagittal · 1.07mm/px · 3 of 239 slices shown]
[im 80/239  soft-tissue]
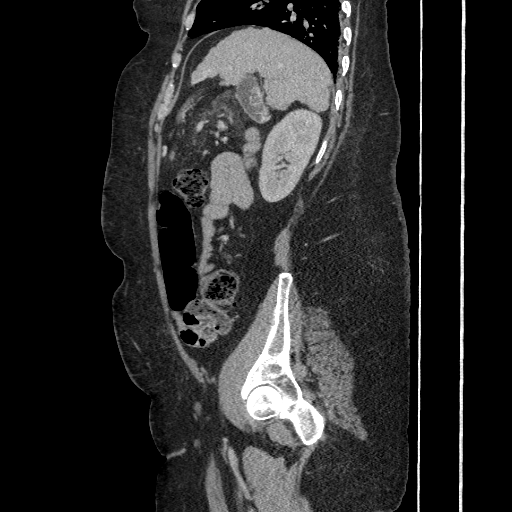
[im 106/239  soft-tissue]
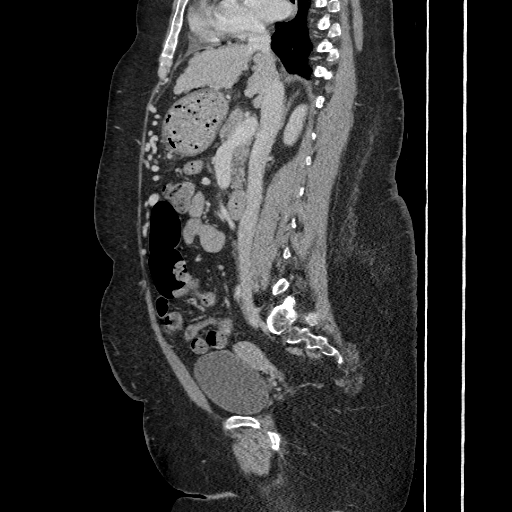
[im 133/239  soft-tissue]
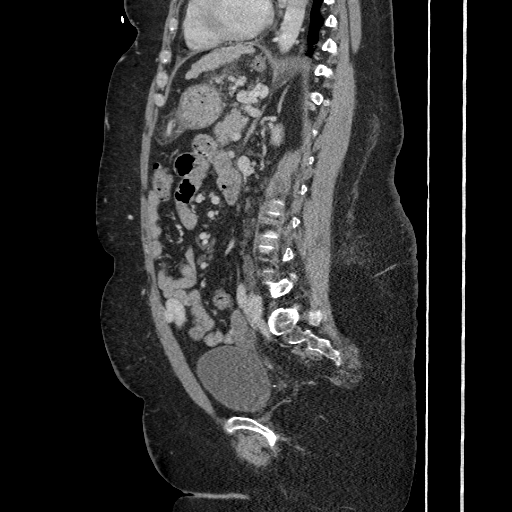

[16 of 46 positions shown; findings below may reference images not displayed]

FINDINGS: There is mild cardiomegaly. There is interlobular septal thickening at the lung bases bilaterally likely representing congestive changes.
The pancreas, kidneys and adrenals are unremarkable. The liver is small in size with nodular contour consistent with stenosis. The portal vein is dilated with multiple portosystemic collaterals around the distal esophagus and upper abdomen including recanalization of the paraumbilical veins. There is mild to moderate splenomegaly. There are multiple gallbladder calculi without wall thickening or pericholecystic fluid. There is no ureteric calculus or hydroureteronephrosis.
No evidence of bowel obstruction. There is wall thickening of the stomach with adjacent fat stranding suggestive of gastritis.The appendix is within normal limits (images 114-132/219). A moderate amount of fecal material is present in the colon. There is a 3 x 2.2 cm lymph node along the left lateral pelvic wall adjacent to the external iliac vessels. There are subcentimeter lymph nodes in the retroperitoneum, root of the mesentery, groin and along the right external iliac vessels.
The urinary bladder is unremarkable. The uterus and adnexa are unremarkable. There is no free fluid or free air.
The osseous structures are unremarkable.
IMPRESSION: 
IMPRESSION: No ureteric calculus or hydroureteronephrosis.
Cholelithiasis without evidence of acute cholecystitis. Recommend further evaluation with sonography, if clinically indicated.
Findings suggestive of gastritis
Cirrhosis with splenomegaly and features of portal hypertension, similar to prior study
There is new left inguinal and left pelvic sidewall adenopathy up to 2.2 cm since 04/13/2022. Rapid development suggests an infectious or inflammatory etiology, neoplastic cause is not excluded.
A discrepancy note was entered

## 2022-05-11 IMAGING — CT CT CHEST PULMONARY EMBOLISM WITH IV CONTRAST
2 of 5 series · 13 of 36 positions shown · non-contrast
Comparison: none

Chest pain, SOB, bi lateral flank pain.
FINAL REPORT:
CT angiogram of the chest. [DATE], 4146 1262 hours
CLINICAL HISTORY: Pulmonary embolism (PE) suspected, high prob.
TECHNIQUE: Helical axial sections with sagittal and coronal reformats of the chest were obtained with intravenous contrast. Iterative reconstruction technique was employed to reduce patient radiation exposure. 3D/MIP reconstructed images were also provided.
Radiation Dose: Total exam DLP 90S9.X0mPy/cm.
Compared with the prior study dated April 08, 2022

[Series 2: pe chest · axial · 0.83mm/px · z∈[-243,+7]mm · 10 of 124 slices shown]
[im 12/124  lung]
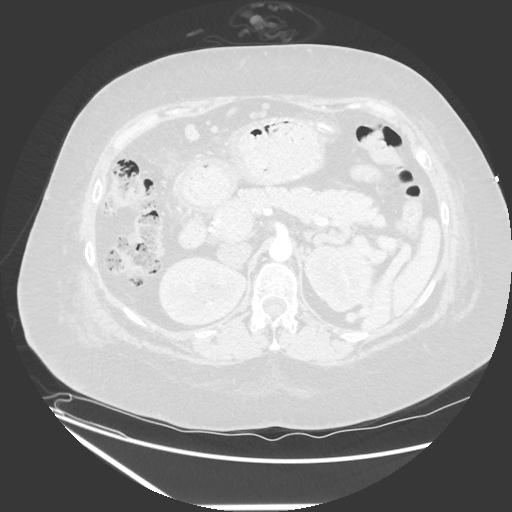
[im 23/124  mediastinal]
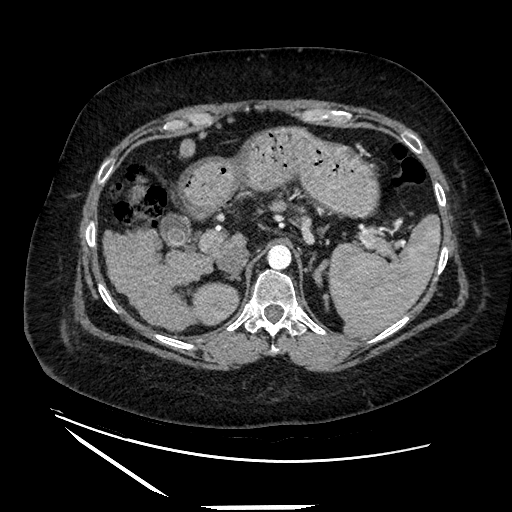
[im 34/124  lung]
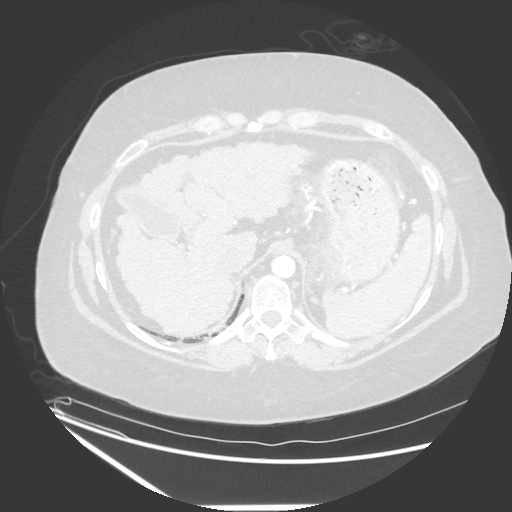
[im 45/124  mediastinal]
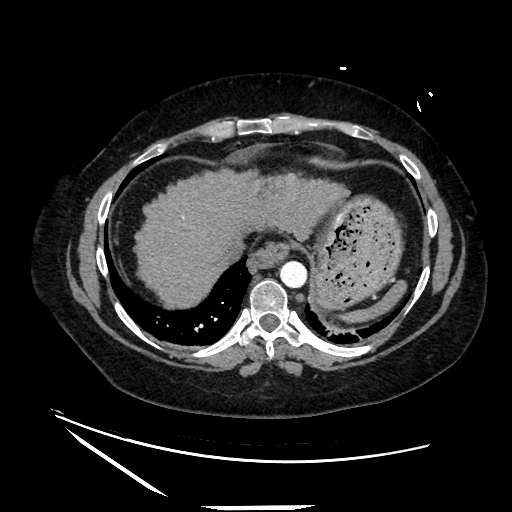
[im 56/124  lung]
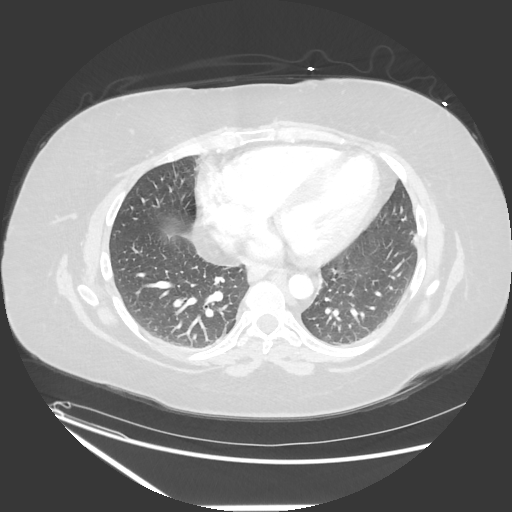
[im 68/124  mediastinal]
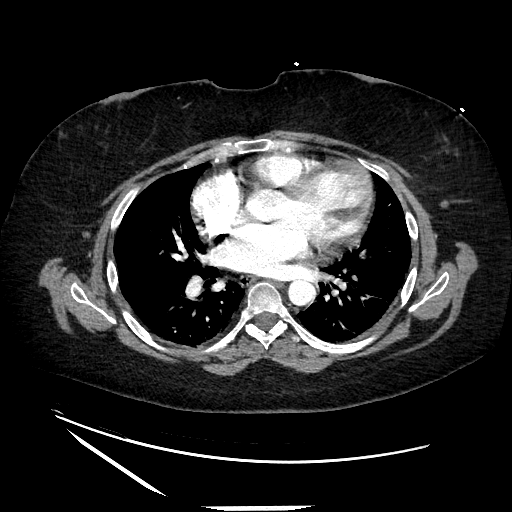
[im 79/124  lung]
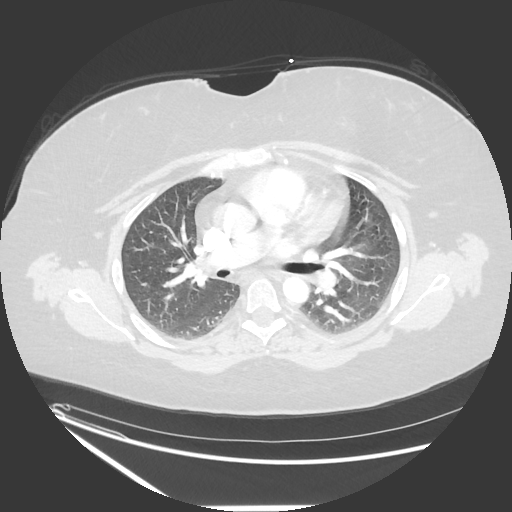
[im 90/124  mediastinal]
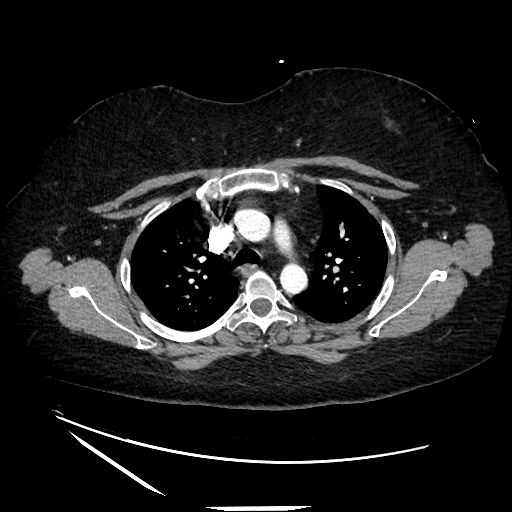
[im 101/124  lung]
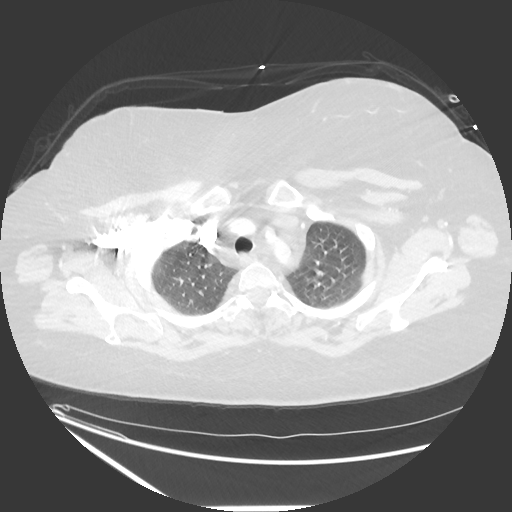
[im 112/124  mediastinal]
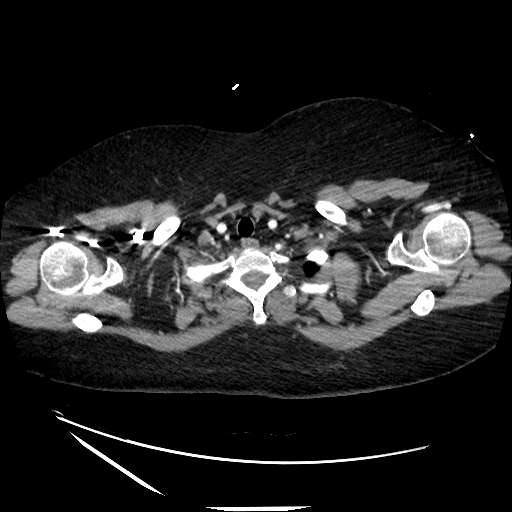

[Series 602: sag standard 2x2 · sagittal · 0.83mm/px · 3 of 214 slices shown]
[im 43/214  mediastinal]
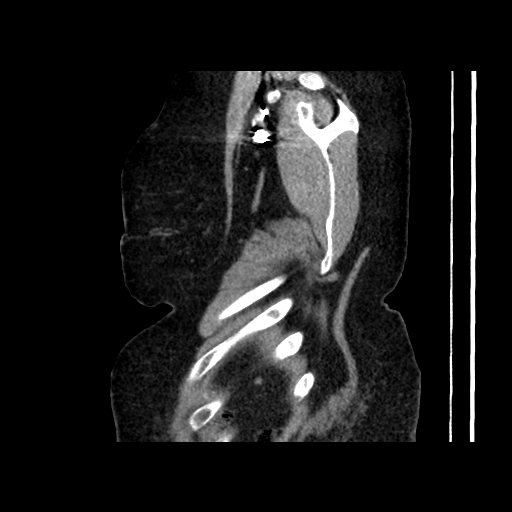
[im 86/214  mediastinal]
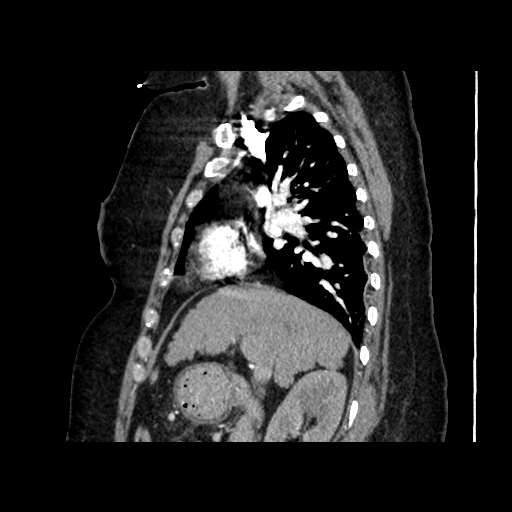
[im 128/214  mediastinal]
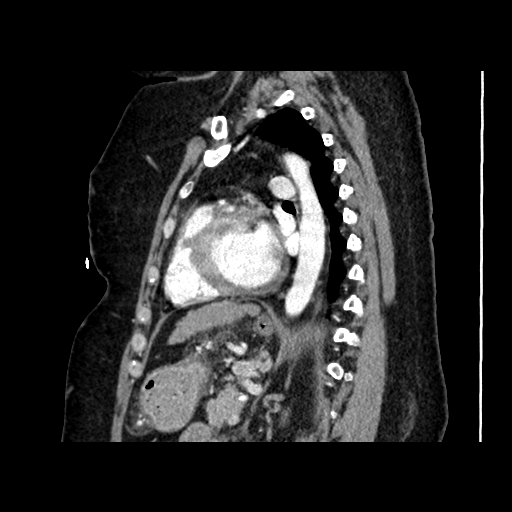

[13 of 36 positions shown; findings below may reference images not displayed]

FINDINGS: There is no filling defect within the pulmonary artery divisions to suggest pulmonary thromboembolism. The mediastinum demonstrates no evidence of mass or lymphadenopathy. The thoracic aorta is unremarkable. There is no pericardial effusion.
The lungs are clear. No evidence of pleural effusion or pneumothorax.
The osseous structures are unremarkable. Again seen is a chronic depressed superior endplate fracture of T7 vertebral body.
The visualized upper abdominal viscera are unremarkable.
IMPRESSION: 
IMPRESSION: No CT evidence of pulmonary thromboembolism or other acute intrathoracic pathology.
Please refer to the report on the abdomen and pelvis CT submitted separately.
Is the patient pregnant?
Waiting for Lab Result

## 2022-05-15 IMAGING — DX XR CHEST 1 VIEW
1 series · 1 of 1 positions shown · non-contrast
Comparison: 05/11/2022.

FINAL REPORT:
EXAM: Portable chest x-ray
INDICATION: cough. . .
TECHNIQUE: Single, portable, AP digital radiograph of the chest.

[AP]
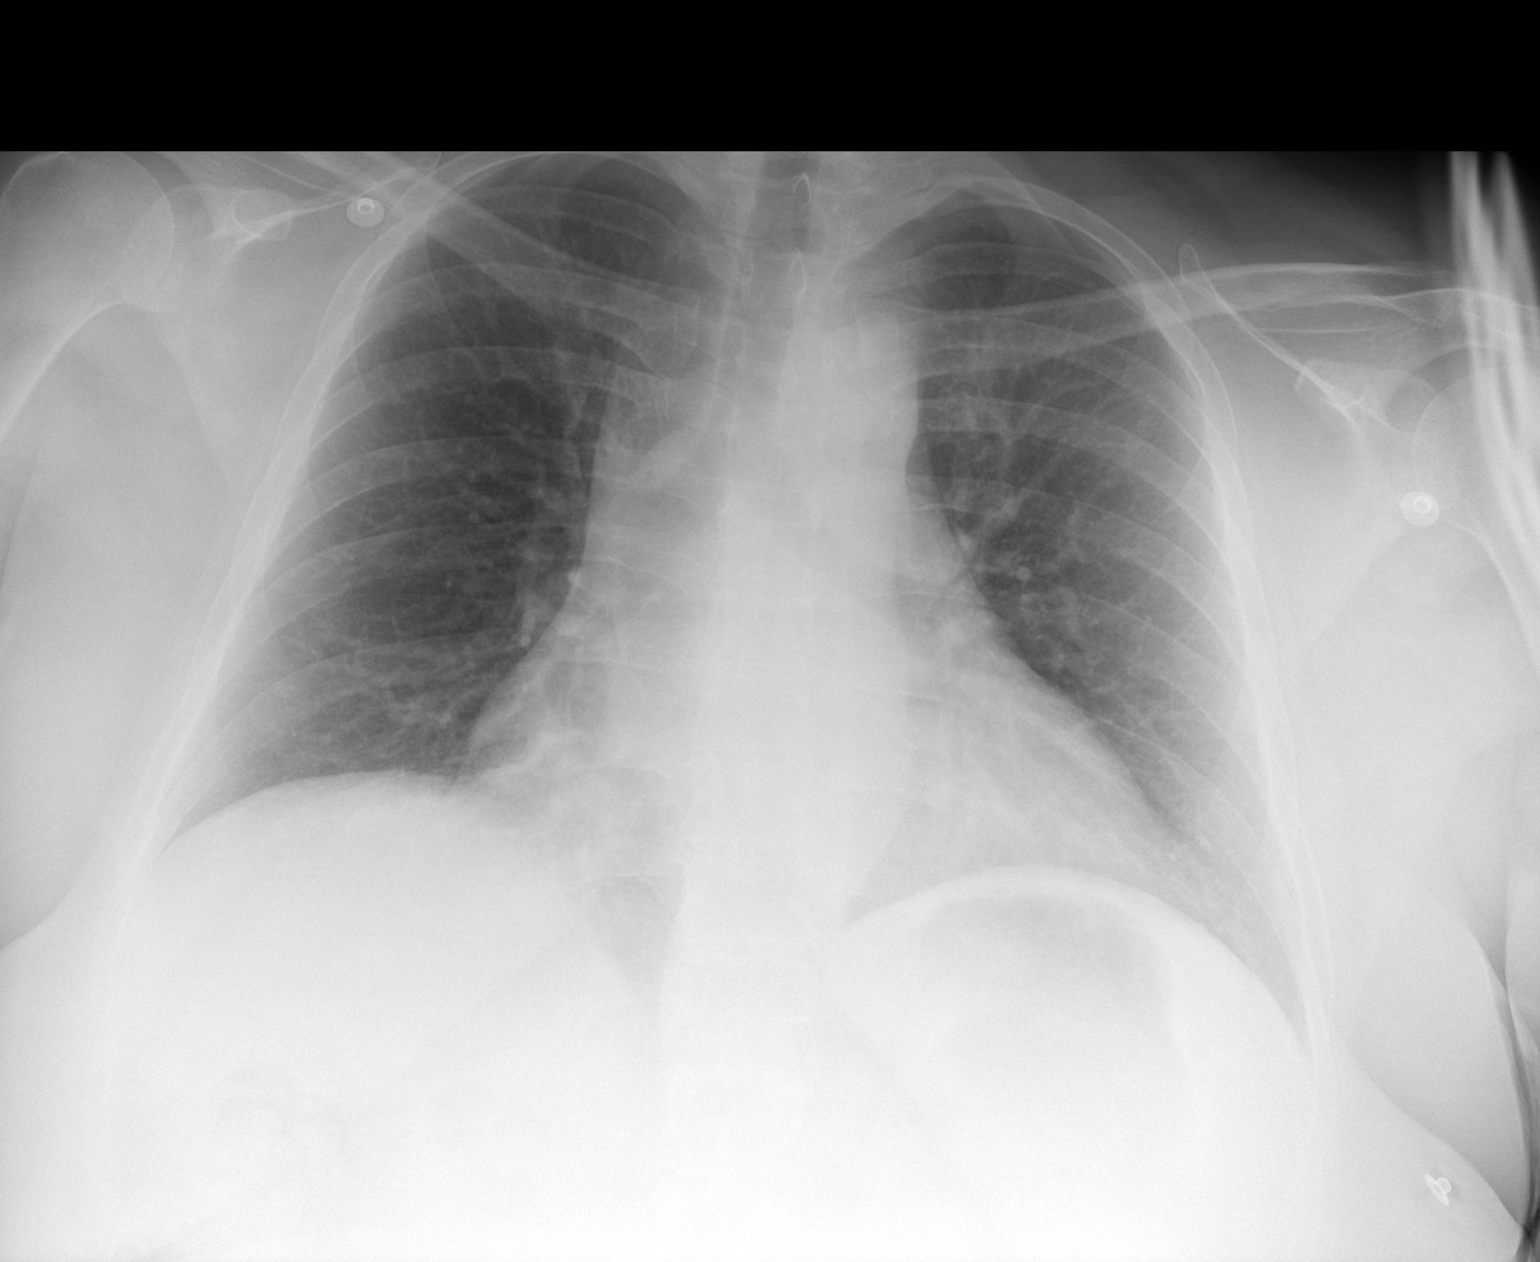

[1 of 1 positions shown; findings below may reference images not displayed]

FINDINGS: Hyperventilation. No focal consolidation, pleural effusion, or pneumothorax. Cardiomediastinal silhouette is unremarkable. No pulmonary edema pattern.
IMPRESSION: No features of an acute intrathoracic process.
Is the patient pregnant?
No

## 2022-05-26 IMAGING — CT CT ABDOMEN PELVIS WITH CONTRAST
2 of 3 series · 17 of 46 positions shown, 19 images · IV contrast (agent unspecified)
Comparison: 05/11/2022

Cellulitis (PT has cellulitis in her lower abdomen, pt coming in for continued pain. PT has rigors during triage assessment. )
FINAL REPORT:
CT ABDOMEN PELVIS WITH CONTRAST
INDICATION: Abdominal pain, acute, nonlocalized
TECHNIQUE: CT imaging obtained of the abdomen and pelvis after the intravenous administration of contrast.  All CT scans at this facility use dose modulation and/or weight based dosing when appropriate to reduce radiation dose to as low as reasonably achievable. (#[id number]#)

[Series 2: abd/pel · axial · 0.98mm/px · z∈[-415,+10]mm · 14 of 196 slices shown, 16 images]
[im 13/196  soft-tissue]
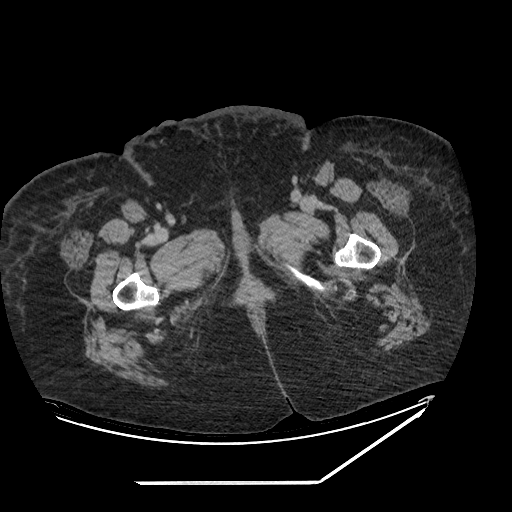
[im 13/196  bone]
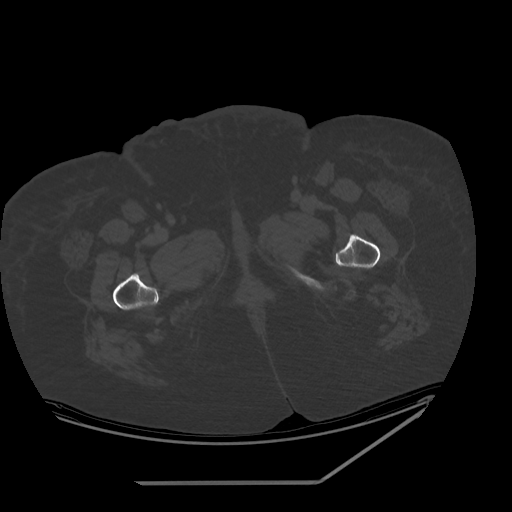
[im 26/196  soft-tissue]
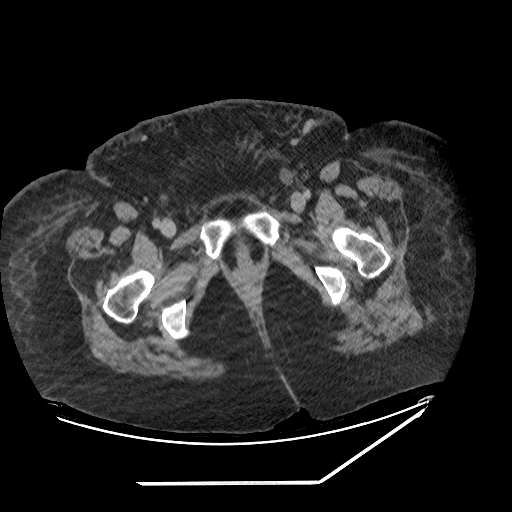
[im 38/196  soft-tissue]
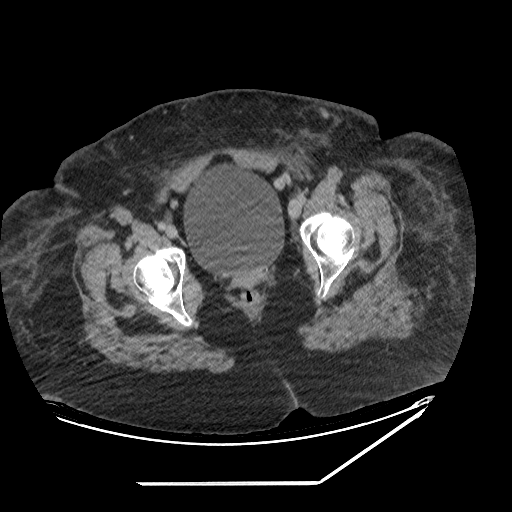
[im 51/196  soft-tissue]
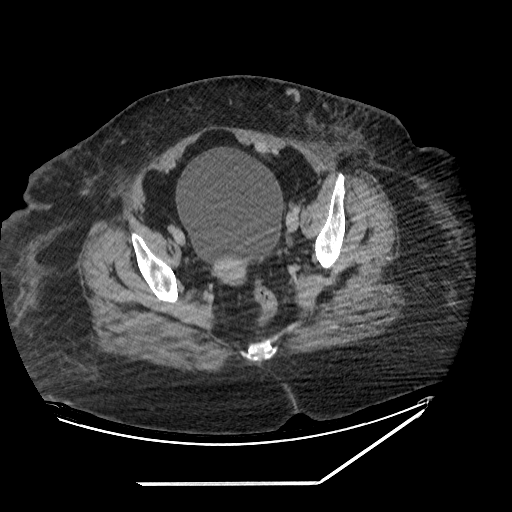
[im 63/196  soft-tissue]
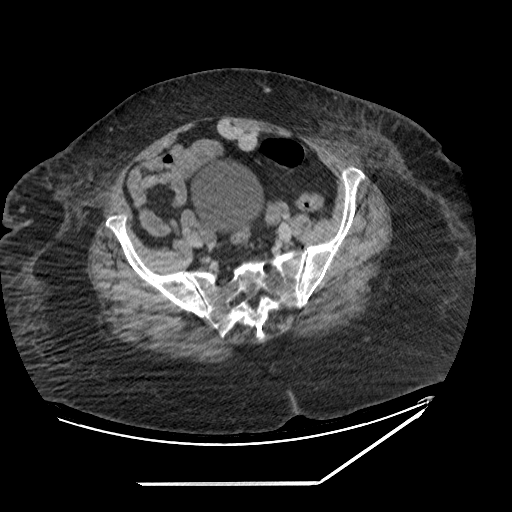
[im 76/196  soft-tissue]
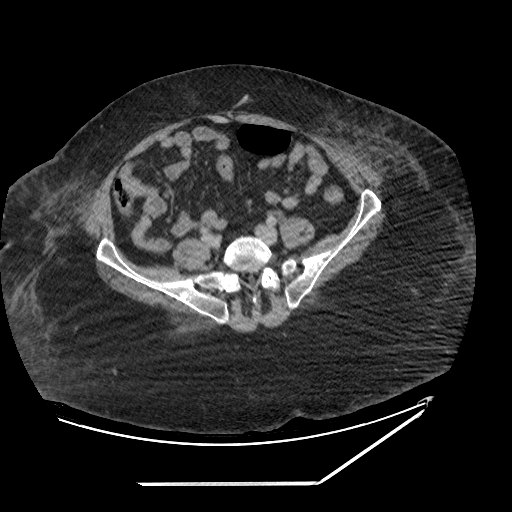
[im 89/196  soft-tissue]
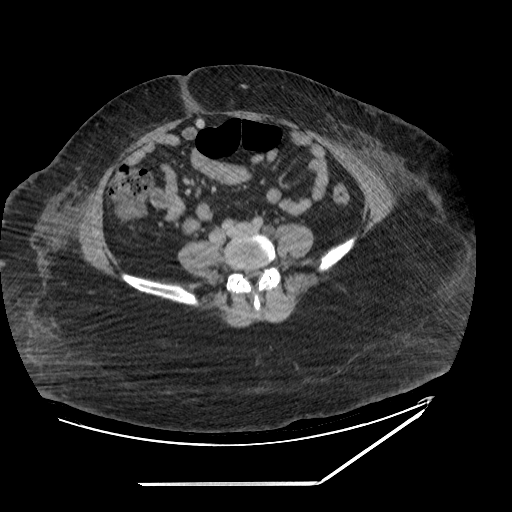
[im 107/196  soft-tissue]
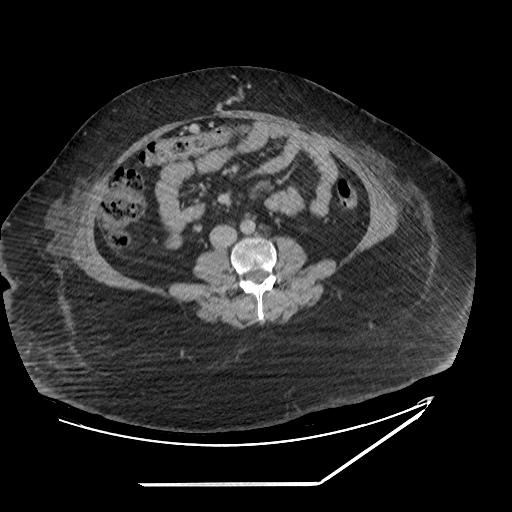
[im 120/196  soft-tissue]
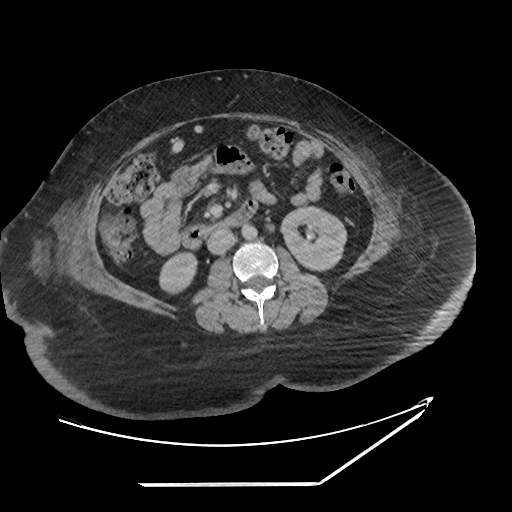
[im 120/196  bone]
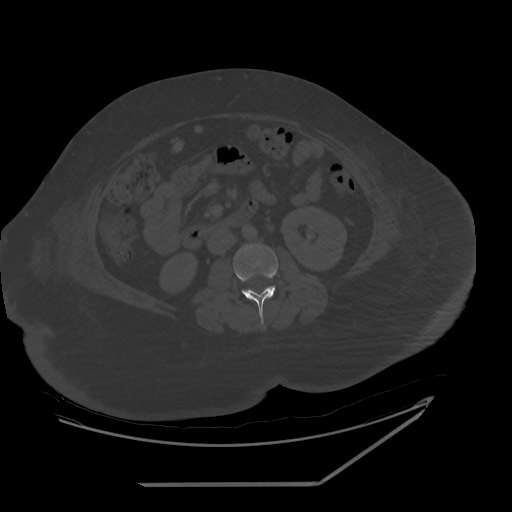
[im 133/196  soft-tissue]
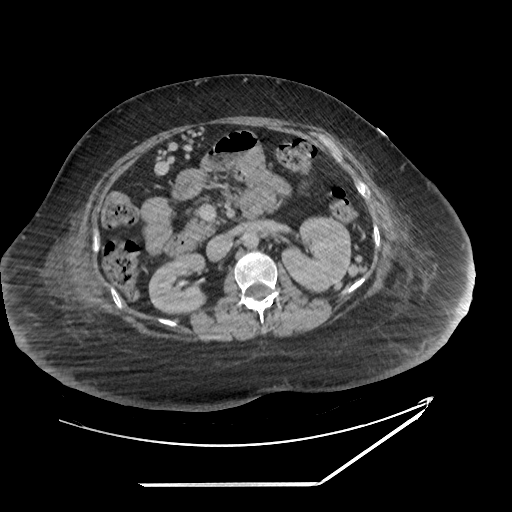
[im 145/196  soft-tissue]
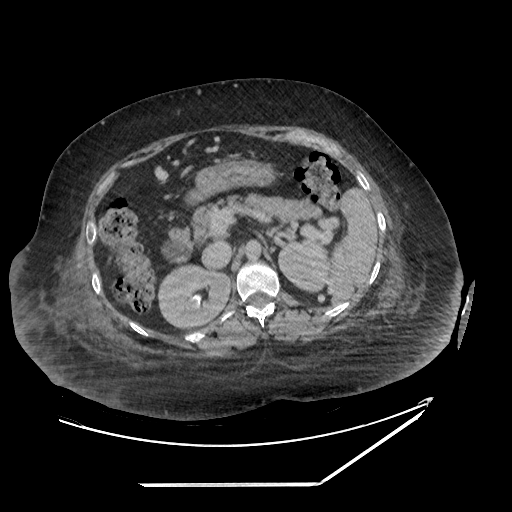
[im 158/196  soft-tissue]
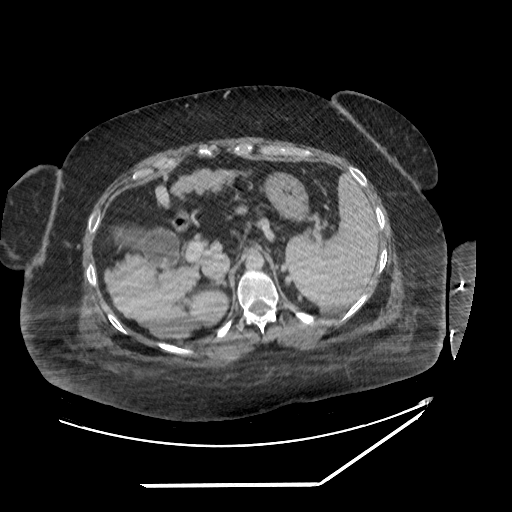
[im 170/196  soft-tissue]
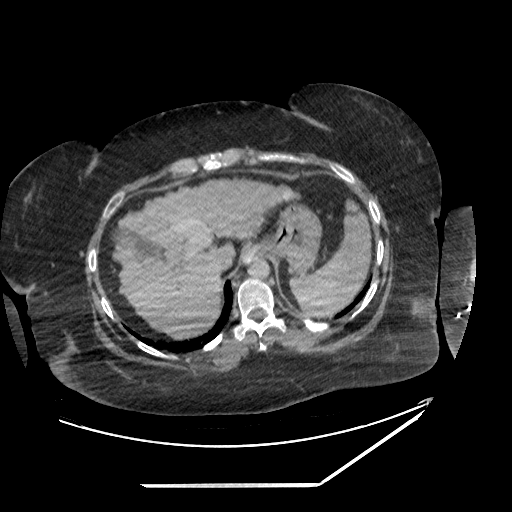
[im 183/196  soft-tissue]
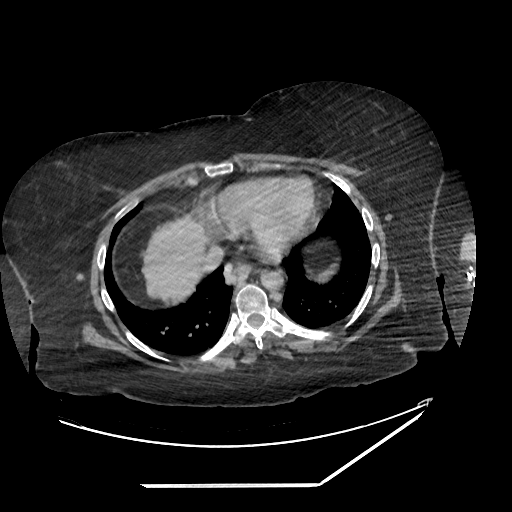

[Series 602: sag standard 2x2 · sagittal · 0.98mm/px · 3 of 233 slices shown]
[im 78/233  soft-tissue]
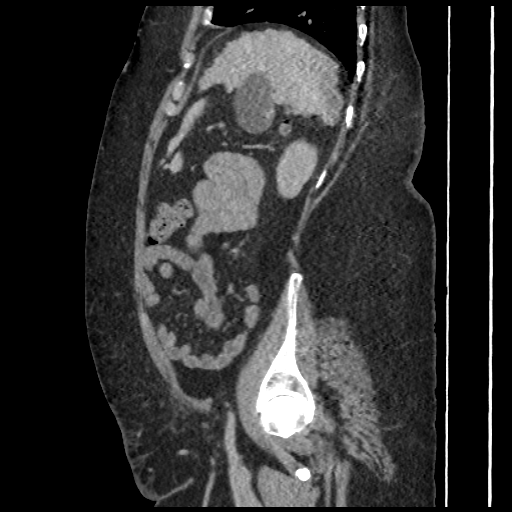
[im 104/233  soft-tissue]
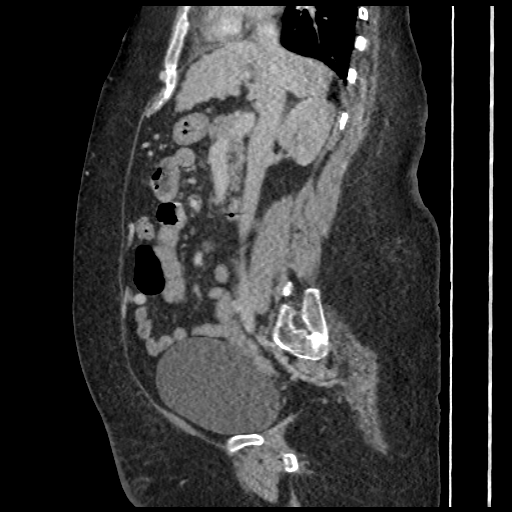
[im 129/233  soft-tissue]
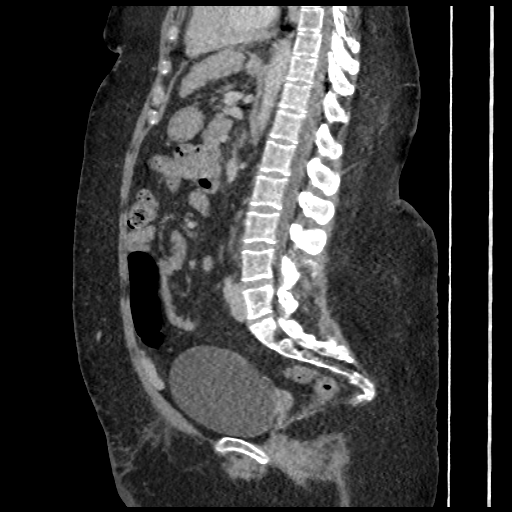

[17 of 46 positions shown; findings below may reference images not displayed]

FINDINGS: Lower thorax: Mild dependent atelectasis. The heart appears normal.
Liver: Cirrhotic liver. No appreciable mass.
Gallbladder/biliary: Gallstones without evidence of acute cholecystitis.
Spleen: Normal.
Pancreas: Normal.
Adrenal glands: Normal.
Kidneys: Normal.
Abdominal wall: Swelling in the ventral abdominal wall/pannus/mons pubis. No abscess.
Vascular: Multiple splenic and esophageal varices. Additional varices in the omentum extending to the left common femoral vein.
Mesentery: No adenopathy, free air, or free fluid.
Stomach/bowel: No bowel dilatation or wall thickening. The appendix is normal.
Reproductive organs: Normal.
Bladder: Normal.
Pelvic nodes: No adenopathy.
Bones: Subacute left posterior 12th rib fracture.
IMPRESSION: Cirrhosis with splenic, esophageal and omental varices, as described.
Ventral abdominal cellulitis without abscess.
Otherwise acute findings.

## 2022-05-26 IMAGING — CR XR CHEST 1 VIEW
1 series · 1 of 1 positions shown · non-contrast
Comparison: none

FINAL REPORT:
HISTORY: Chest pain and sepsis.
Portable chest, single view:
Previous: 05/15/2022.
The lungs are clear without focal interstitial process, consolidation, or effusion. Cardiomediastinal contours are within normal limits without pneumothorax.

[AP]
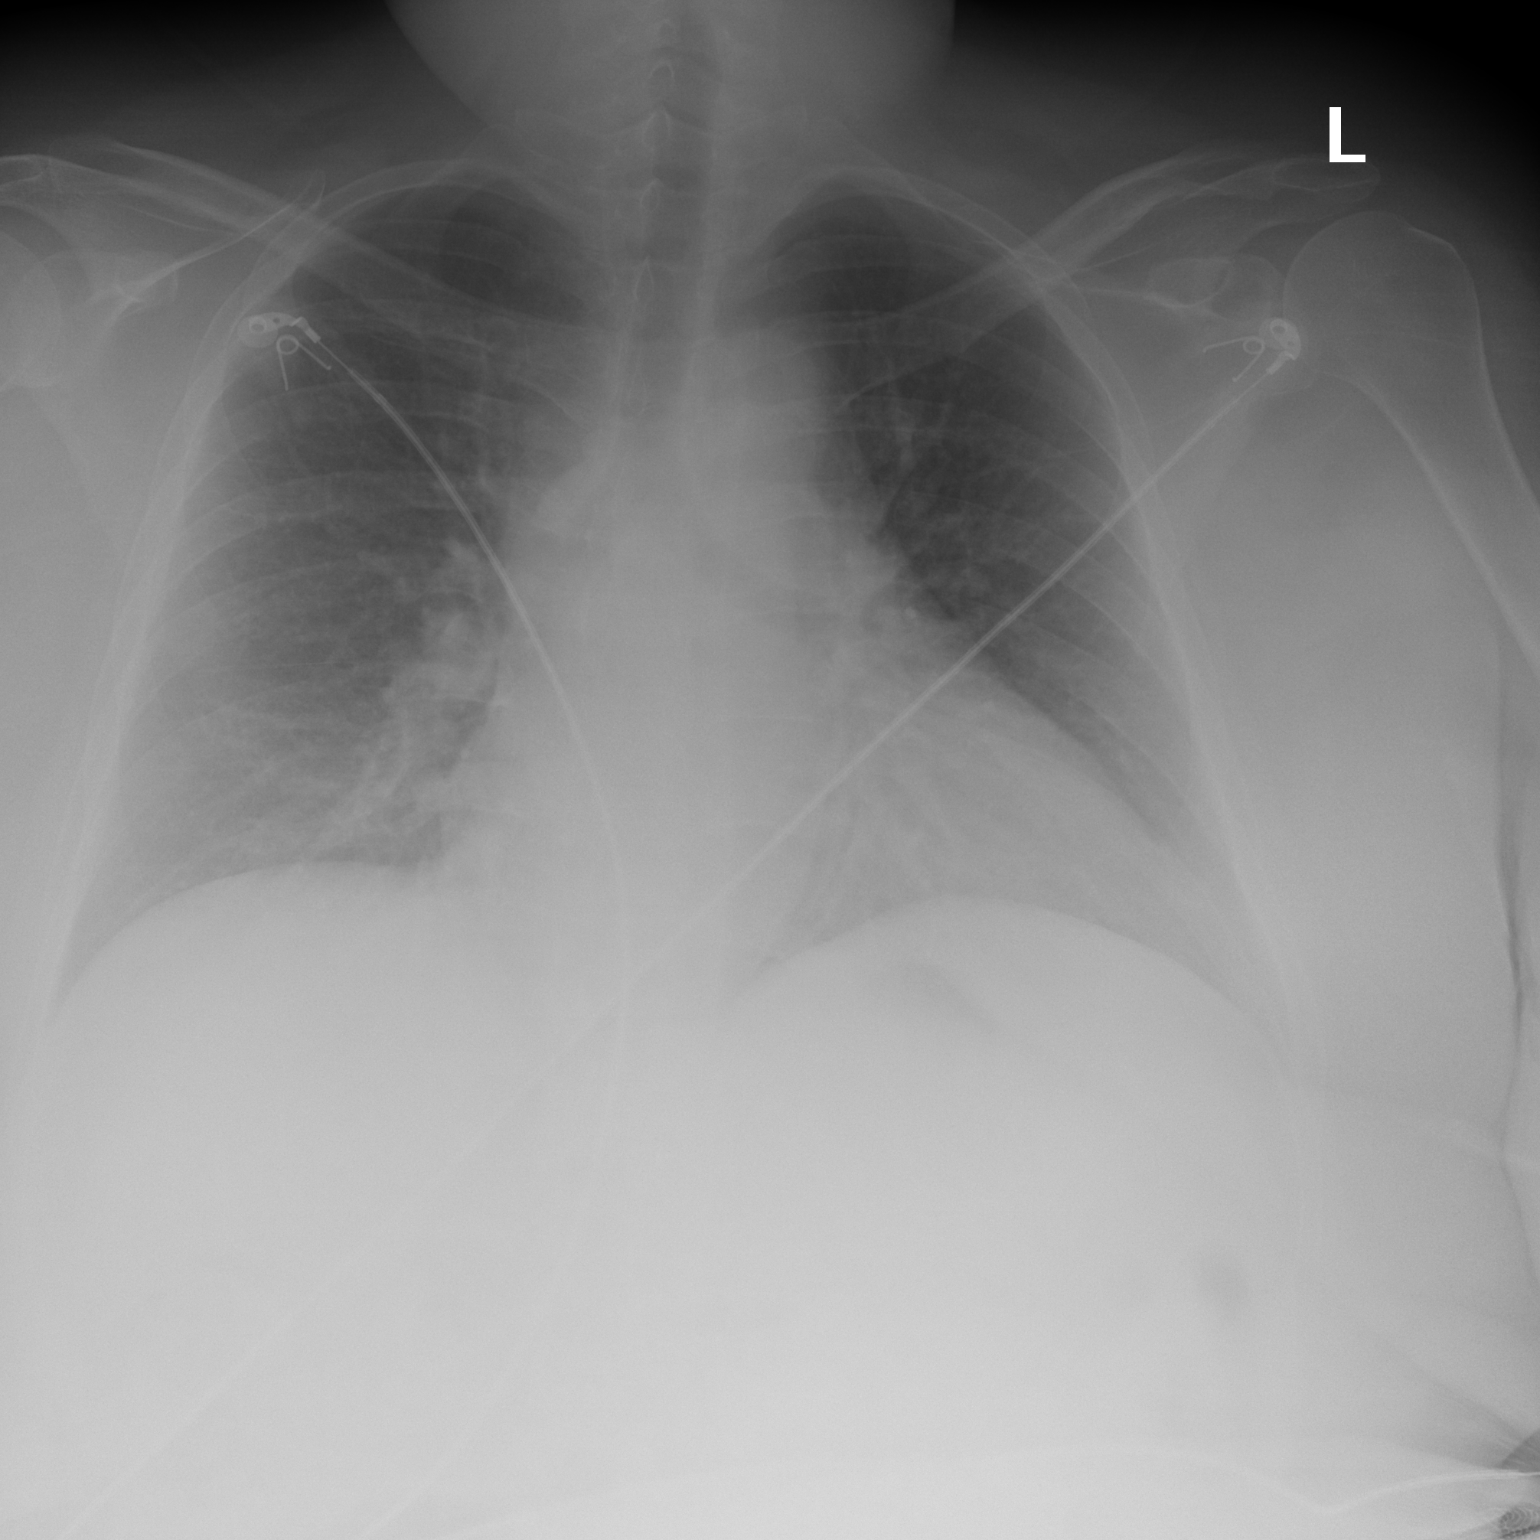

[1 of 1 positions shown; findings below may reference images not displayed]

IMPRESSION: No gross abnormality on this portable exam.
Is the patient pregnant?
No
Portable?
Yes

## 2022-06-03 IMAGING — CT CT ABDOMEN PELVIS WITH CONTRAST
2 of 3 series · 16 of 46 positions shown, 18 images · IV contrast (isovue)
Comparison: none

Ct abdomen and pelvis
Iv only
94cc of isovue in the right forearm @ 2.5ml/s @ 7497
Abdominal pain
Scanned by [facility] Pupuma
FINAL REPORT:
CT scan of the abdomen and pelvis. [DATE], 9893 8996 hours
CLINICAL HISTORY: Abdominal pain, acute, nonlocalized
TECHNIQUE: Helical axial sections with sagittal and coronal reformats of the abdomen and pelvis were obtained with intravenous contrast. Iterative reconstruction technique was employed to reduce patient radiation exposure.
Contrast Dose: NA
Radiation Dose: NA
Compared with the prior study dated May 11, 2022

[Series 2: abd/pel · axial · 0.98mm/px · z∈[-505,-33]mm · 13 of 217 slices shown, 15 images]
[im 14/217  soft-tissue]
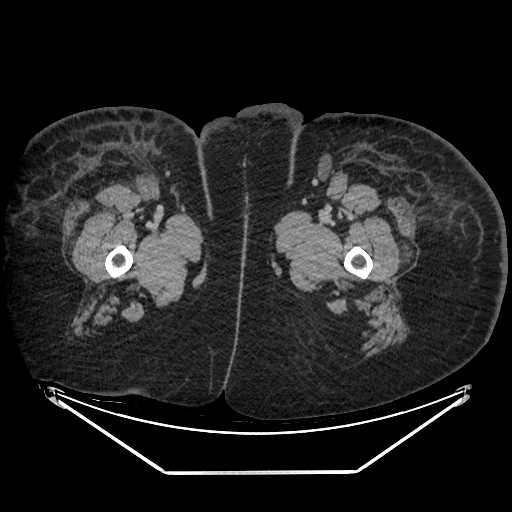
[im 14/217  bone]
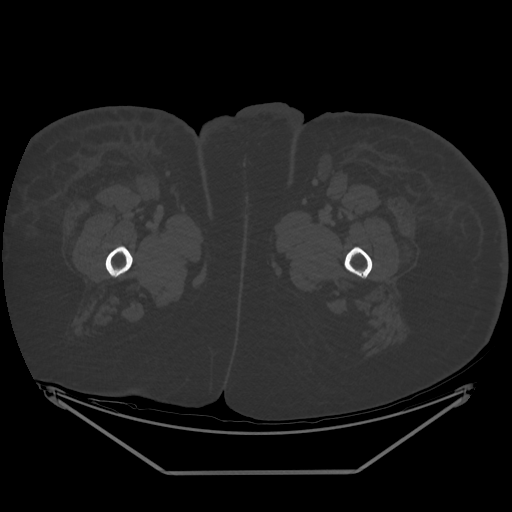
[im 28/217  soft-tissue]
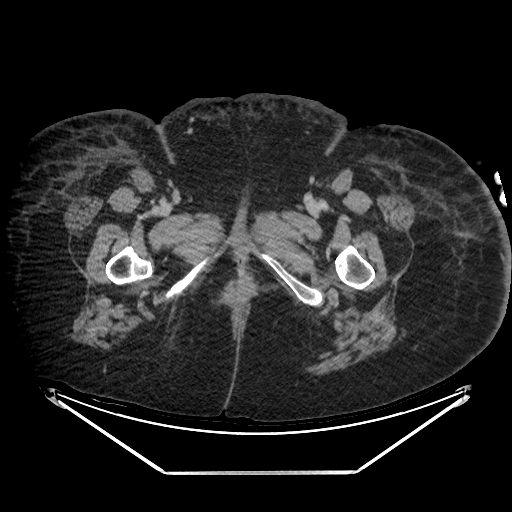
[im 42/217  soft-tissue]
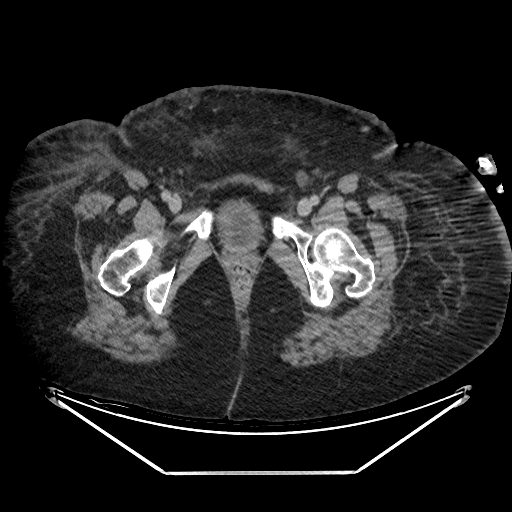
[im 63/217  soft-tissue]
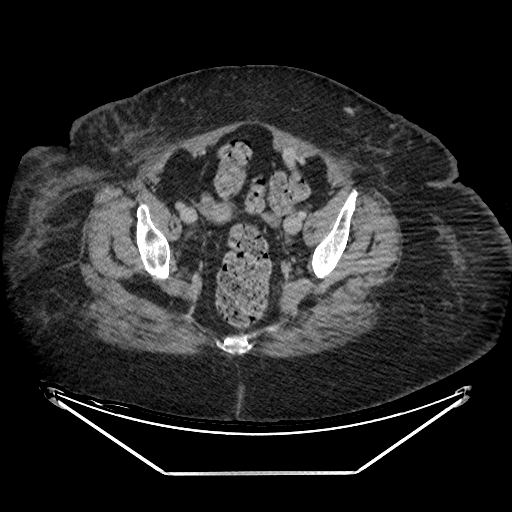
[im 77/217  soft-tissue]
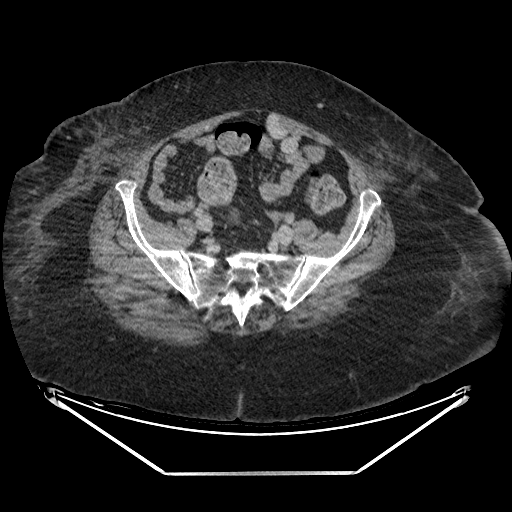
[im 91/217  soft-tissue]
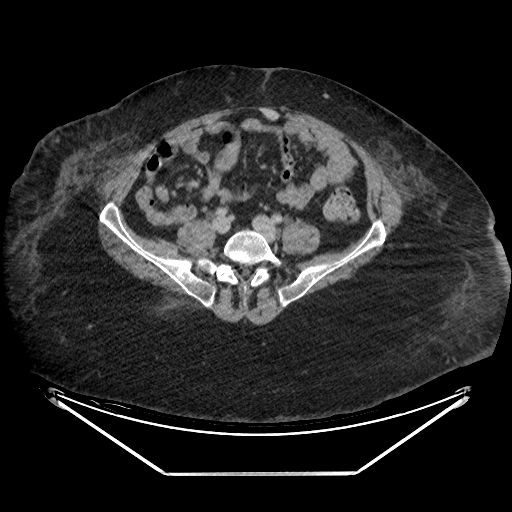
[im 112/217  soft-tissue]
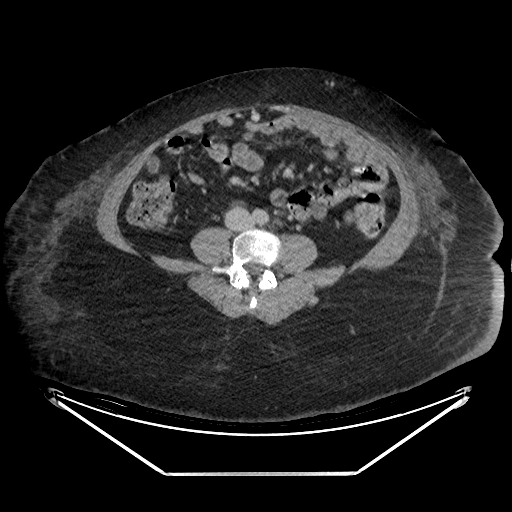
[im 126/217  soft-tissue]
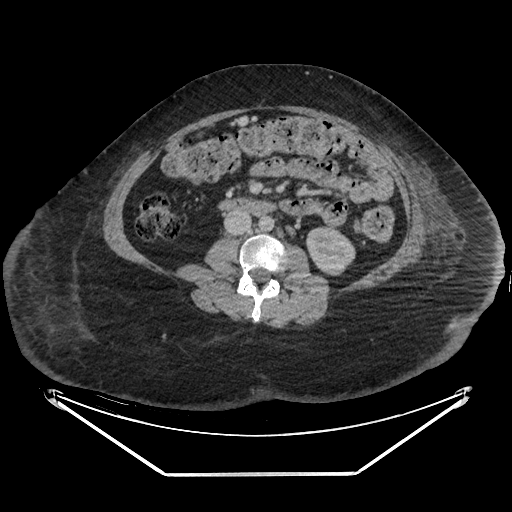
[im 140/217  soft-tissue]
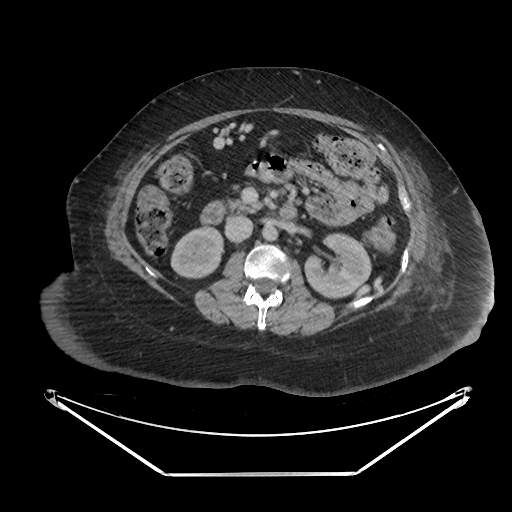
[im 140/217  bone]
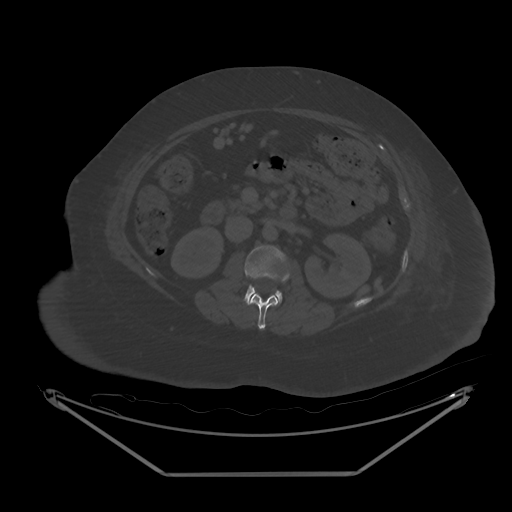
[im 154/217  soft-tissue]
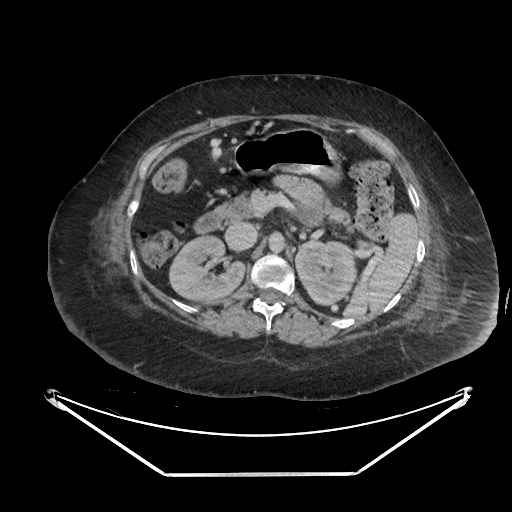
[im 175/217  soft-tissue]
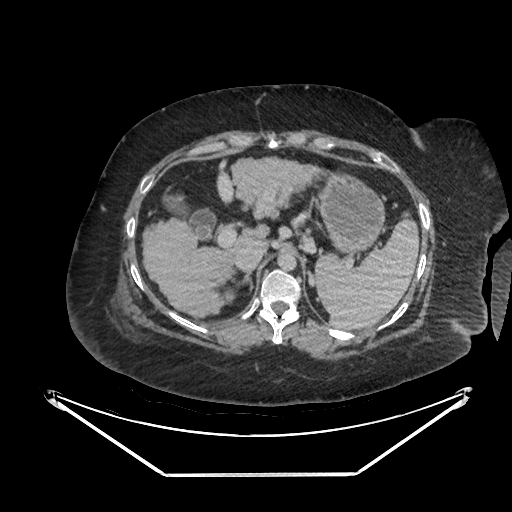
[im 189/217  soft-tissue]
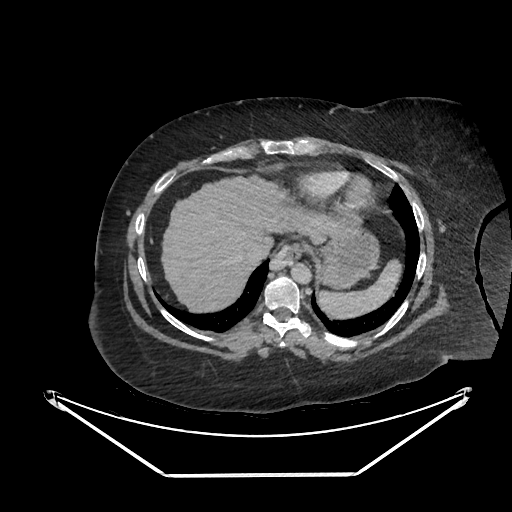
[im 203/217  soft-tissue]
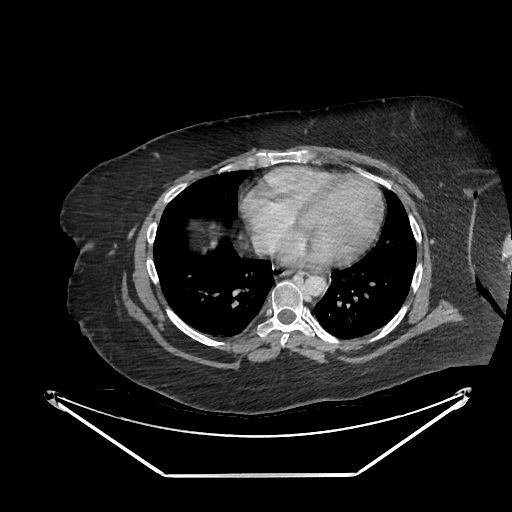

[Series 602: sag standard 2x2 · sagittal · 1.06mm/px · 3 of 250 slices shown]
[im 84/250  soft-tissue]
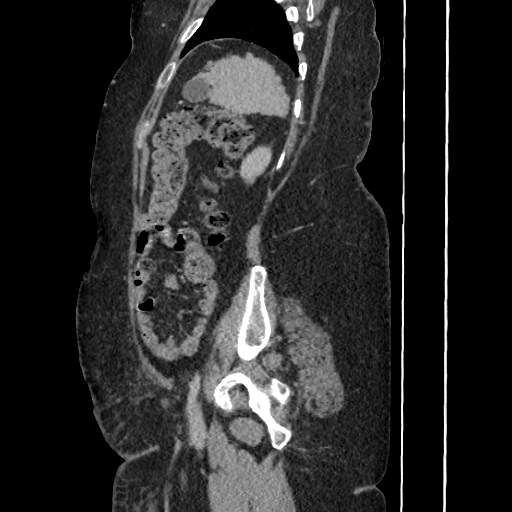
[im 111/250  soft-tissue]
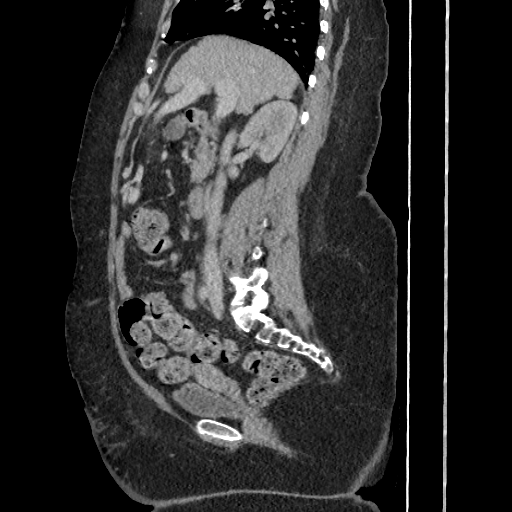
[im 139/250  soft-tissue]
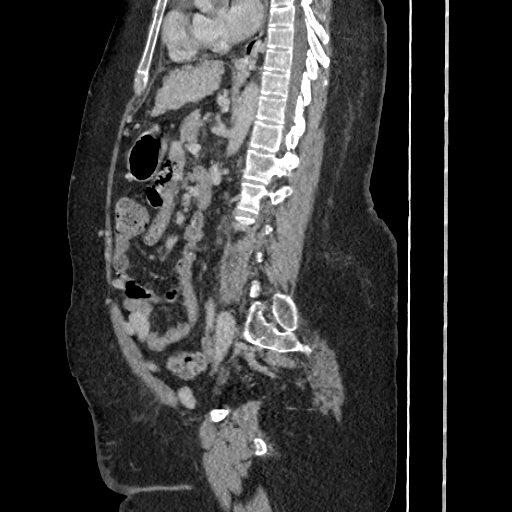

[16 of 46 positions shown; findings below may reference images not displayed]

FINDINGS: Bibasilar atelectasis is seen.
The liver demonstrates a nodular contour, consistent with cirrhosis. The portal vein is dilated with multiple portosystemic collaterals around the distal esophagus and upper abdomen including recanalization of the paraumbilical veins. There are multiple gallbladder calculi without wall thickening or pericholecystic fluid. The spleen, pancreas, kidneys and adrenals are unremarkable. No evidence of bowel obstruction. The appendix is within normal limits (images 108 -129 / 212). Lymph nodes are again seen along the left external iliac vessels, decreased in size since prior examination, now measuring 2.2 x 1.2 cm (previously measured 3 x 2.2 cm).
Persistent subcentimeter lymph nodes are seen in the retroperitoneum, root of the mesentery, groin and along bilateral common iliac vessels and right external iliac vessels. There is interval resolution of the perigastric fat stranding. The urinary bladder is unremarkable. There is no free fluid or free air. The uterus and adnexal are unremarkable. There is moderate soft tissue edema of the lower anterior abdominal wall, groin and the upper thigh bilaterally, mild interval increase in size since the prior examination.
The osseous structures are unremarkable.
IMPRESSION: 
IMPRESSION: 1. No evidence of bowel obstruction, free air or abscess.
2. Cirrhosis with features of portal hypertension, similar to prior study.
3. Other findings as described above.
All CT scans at this facility use iterative reconstruction technique, dose modulation and/or weight based dosing when appropriate to reduce radiation dose to as low as reasonably achievable.
Is the patient pregnant?
Waiting for Lab Result

## 2022-07-06 IMAGING — CR XR CHEST 1 VIEW
1 series · 1 of 1 positions shown · non-contrast
Comparison: 05/26/2022.

Ams
FINAL REPORT:
Chest portable one view
INDICATION: altered mental status

[AP]
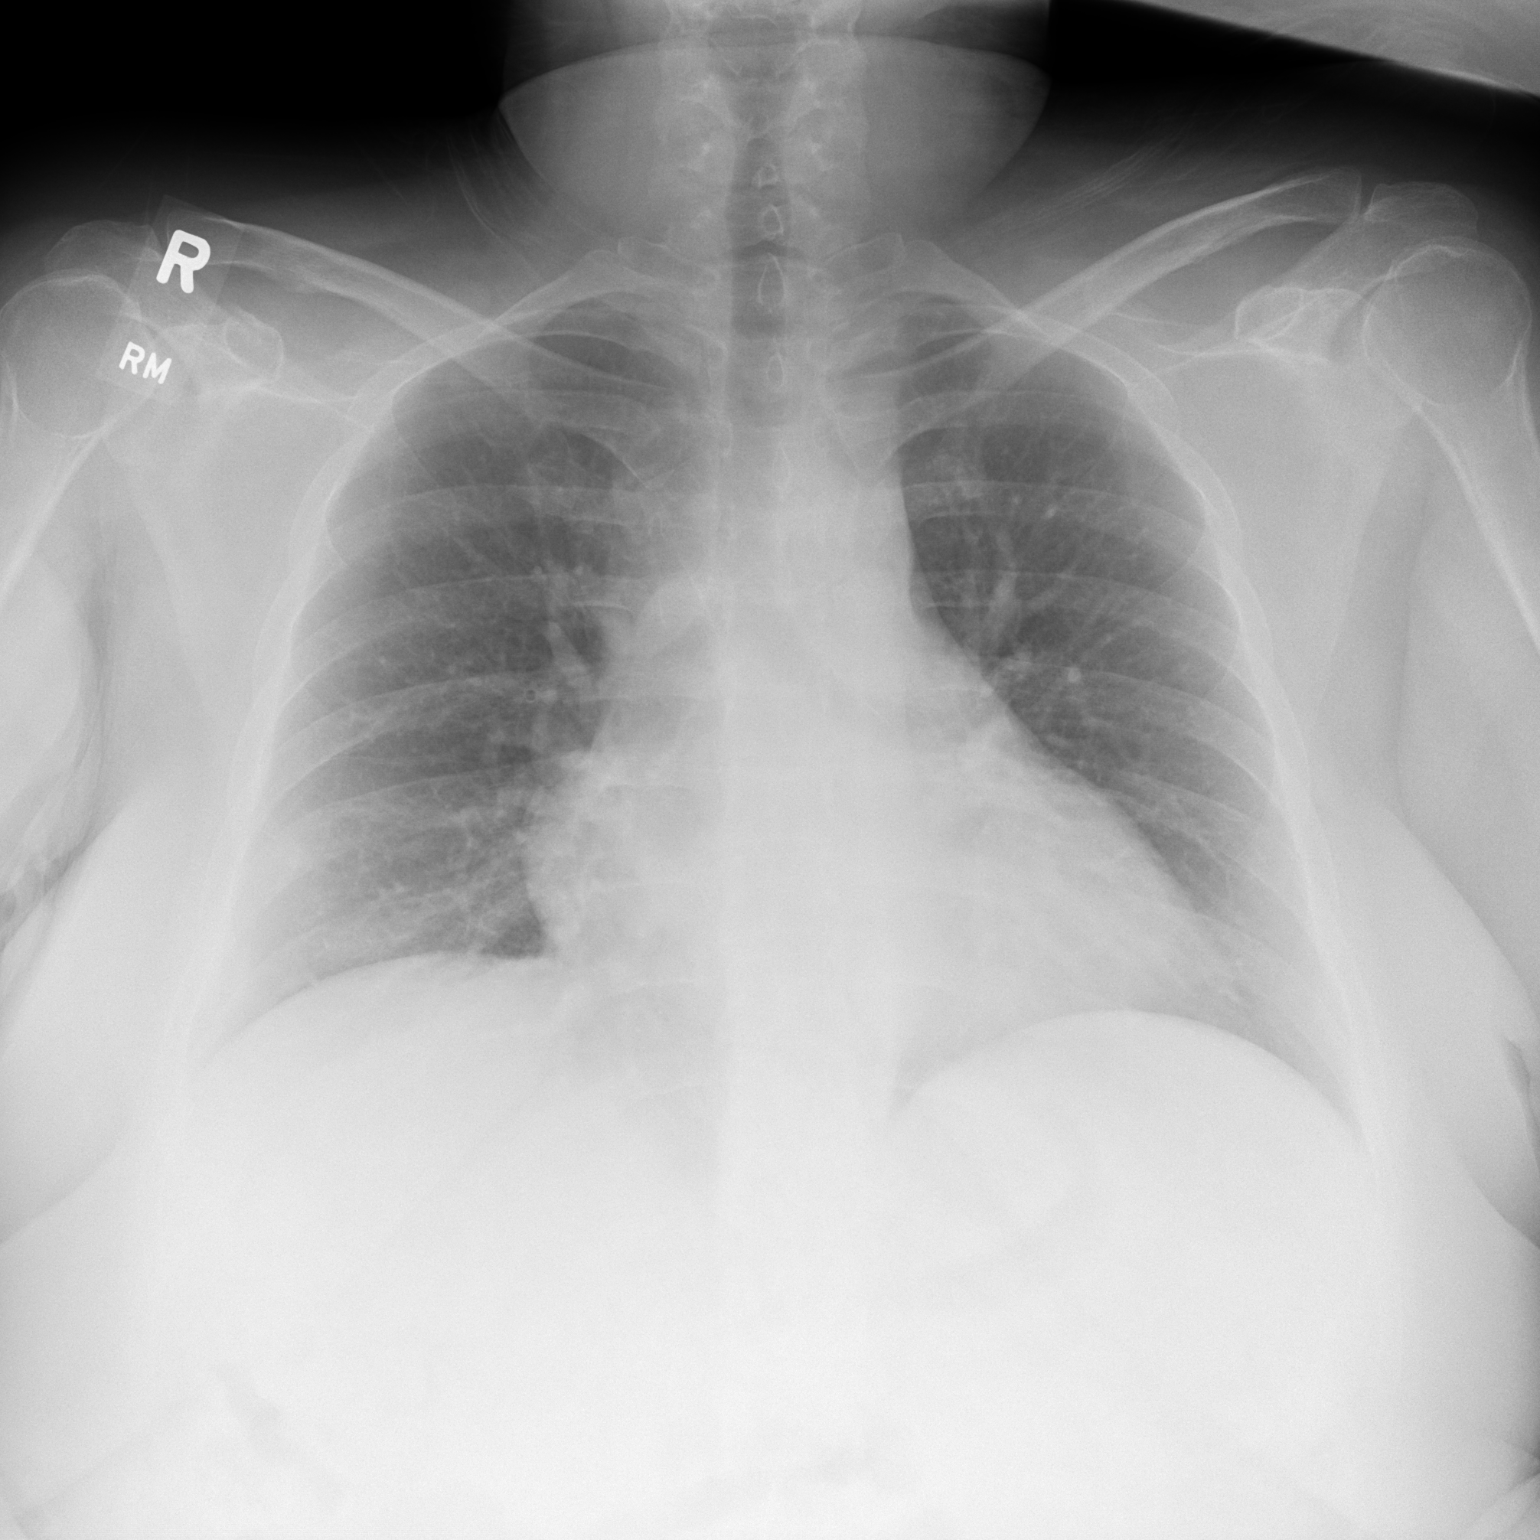

[1 of 1 positions shown; findings below may reference images not displayed]

FINDINGS: The cardiac silhouette is within normal limits given portable technique.
The lungs are clear.   No pneumothorax. No sizeable pleural effusion.
No acute osseous abnormalities.
IMPRESSION: 
IMPRESSION: No acute cardiopulmonary process.
Is the patient pregnant?
Unknown

## 2022-07-06 IMAGING — DX XR TOES 2+ VIEWS RIGHT
1 series · 3 of 3 positions shown · non-contrast
Comparison: none

Kicked [employer] Kfl[employer] by accident
FINAL REPORT:
HISTORY: Trauma to toes

[Series 9646: lateral · right · 0.10mm/px · 3 of 3 slices shown]
[im 1/3]
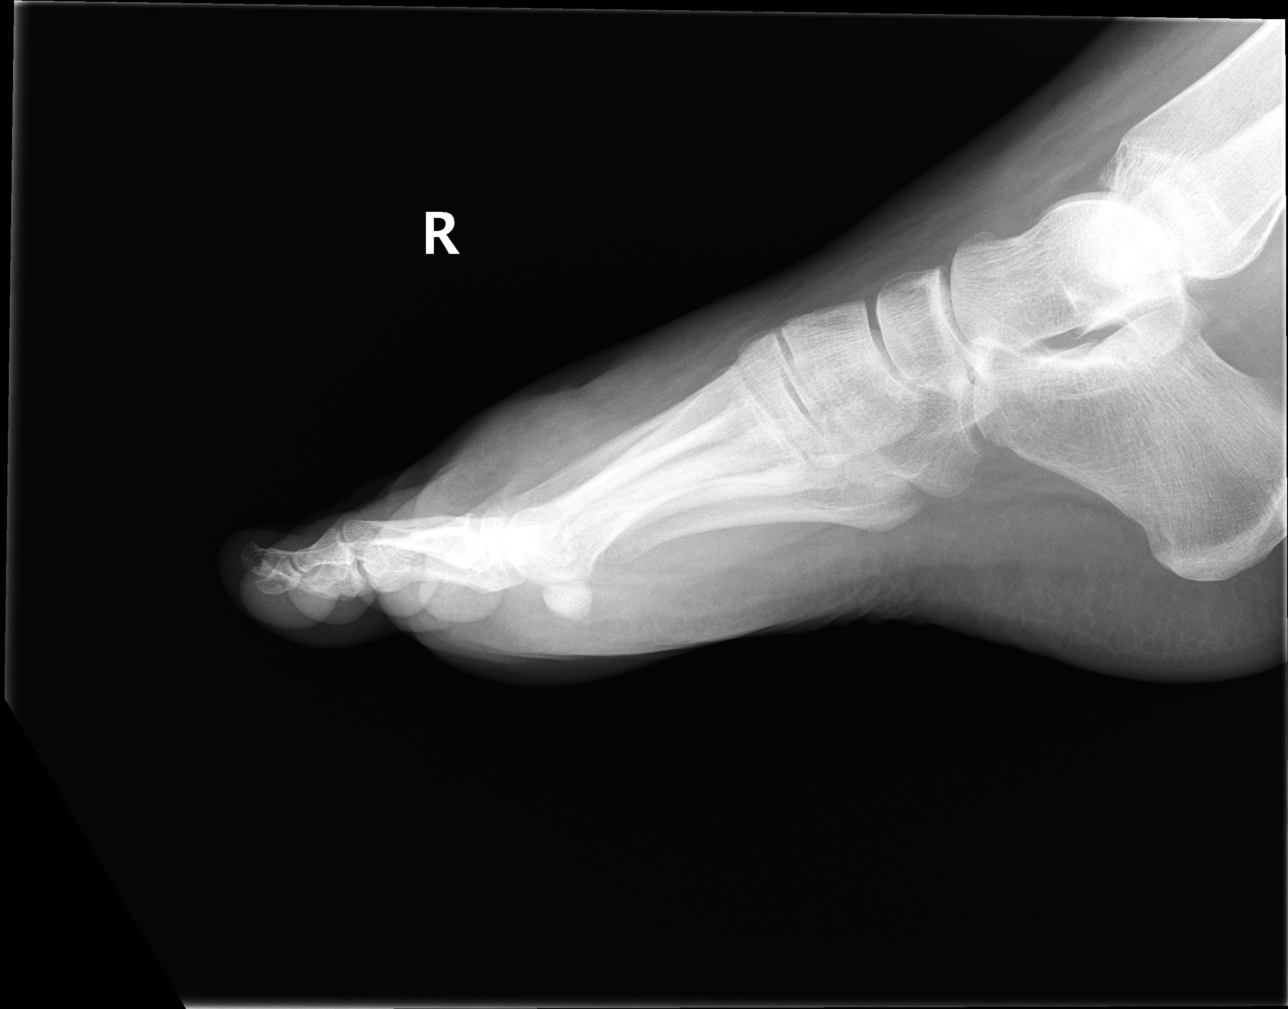
[im 2/3]
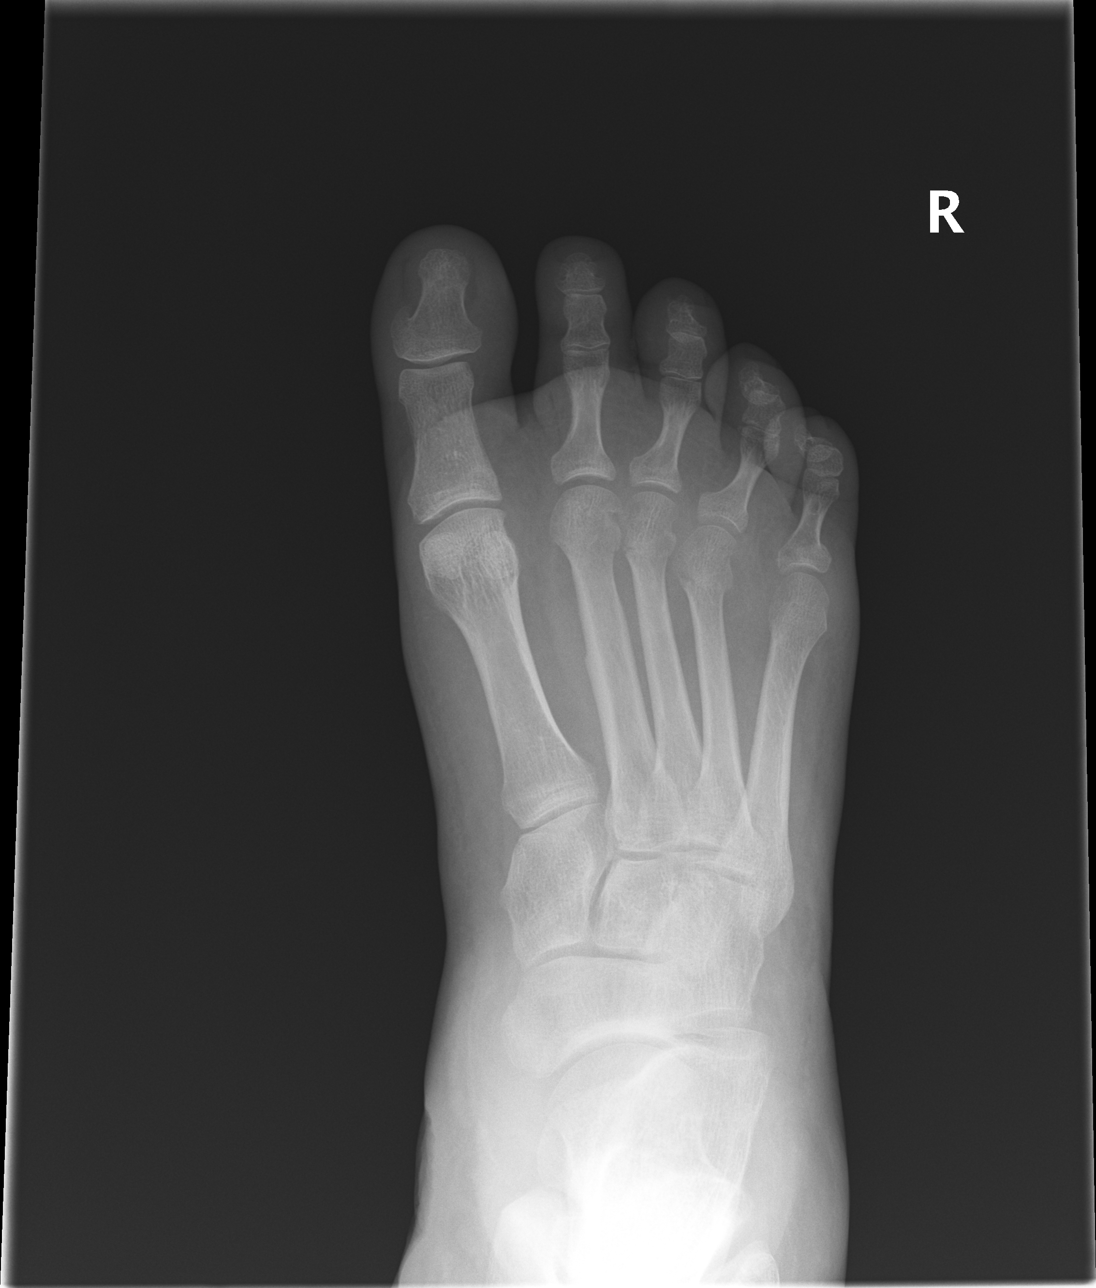
[im 3/3]
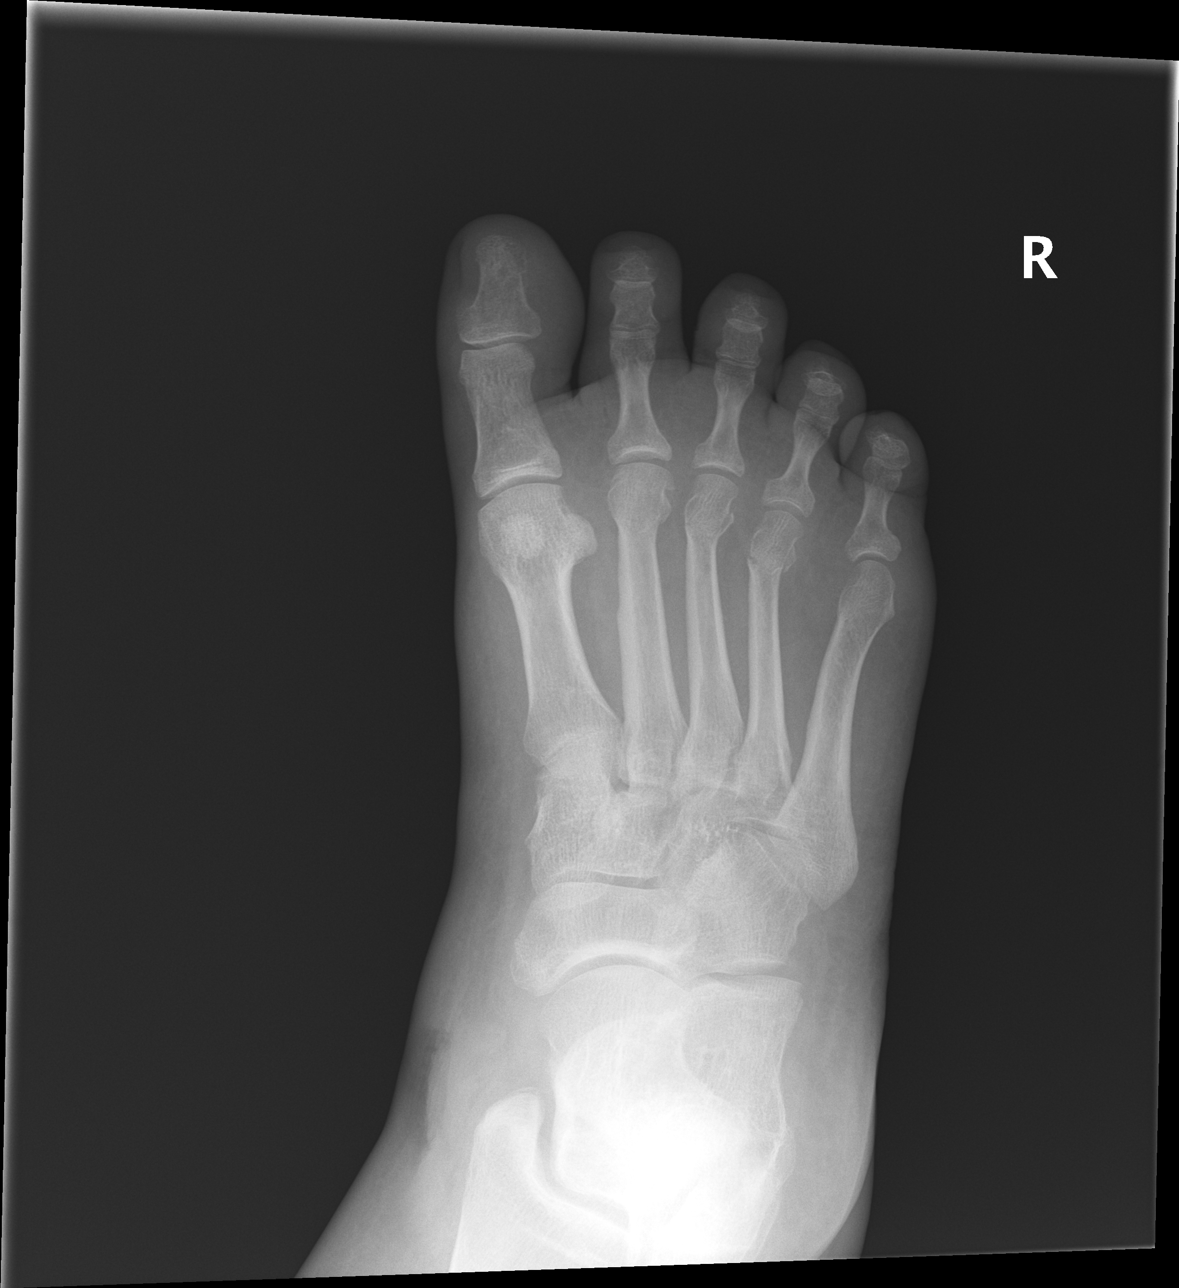

[3 of 3 positions shown; findings below may reference images not displayed]

FINDINGS: 3 views are submitted. Fractures of the distal third and fourth metatarsals. Soft tissue swelling is noted. No additional fracture.
IMPRESSION: Nondisplaced fractures of the third and fourth metatarsals
Is the patient pregnant?
No

## 2022-07-06 IMAGING — CT CT HEAD WITHOUT CONTRAST
3 of 6 series · 16 of 47 positions shown, 19 images · non-contrast
Comparison: No prior study is available for comparison.

Ams
FINAL REPORT:
CT scan of the head. [DATE], 4141 1514 hours
CLINICAL HISTORY: Mental status change, unknown cause
TECHNIQUE: Helical axial sections with sagittal and coronal reformats of the head were obtained without contrast. Iterative reconstruction technique was employed to reduce patient radiation exposure.
Radiation Dose: Total exam DLP 4NJB.43m2y/cm.

[Series 2: head stnd · axial · 0.49mm/px · z∈[-78,+92]mm · 11 of 40 slices shown, 14 images]
[im 3/40  brain]
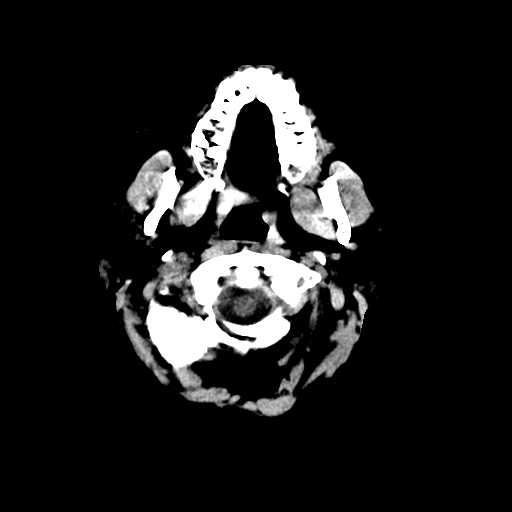
[im 3/40  bone]
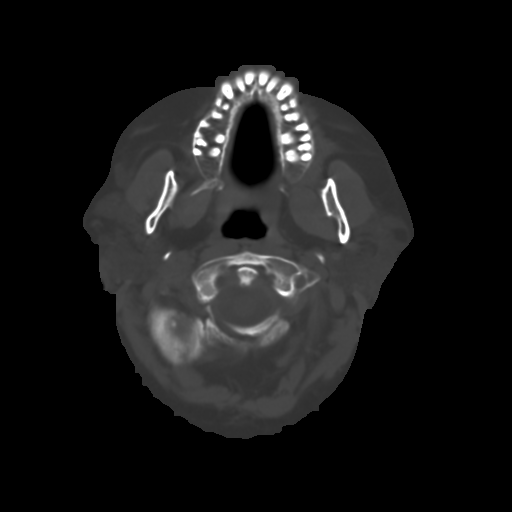
[im 6/40  brain]
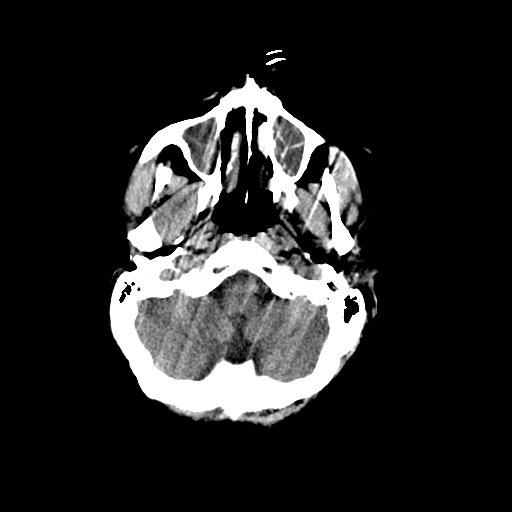
[im 9/40  brain]
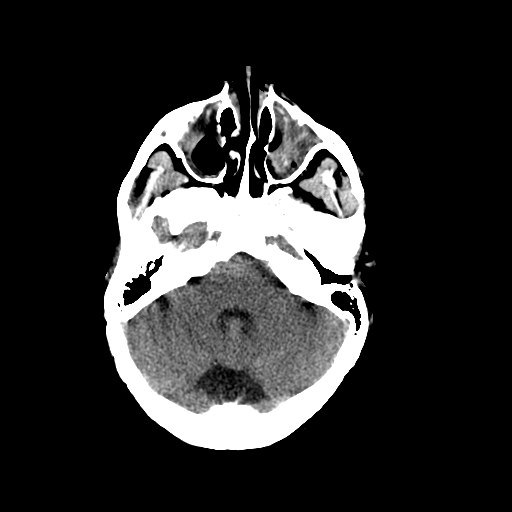
[im 14/40  brain]
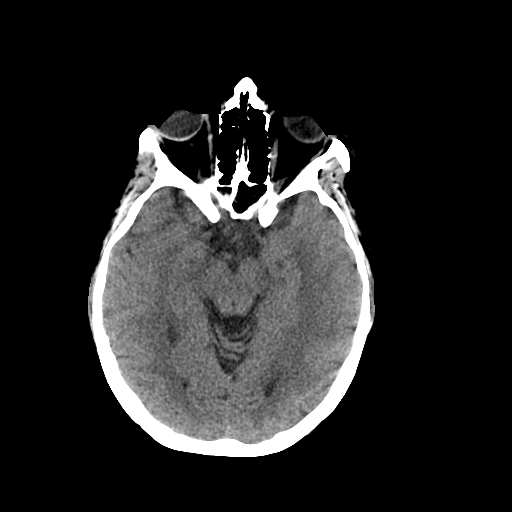
[im 17/40  brain]
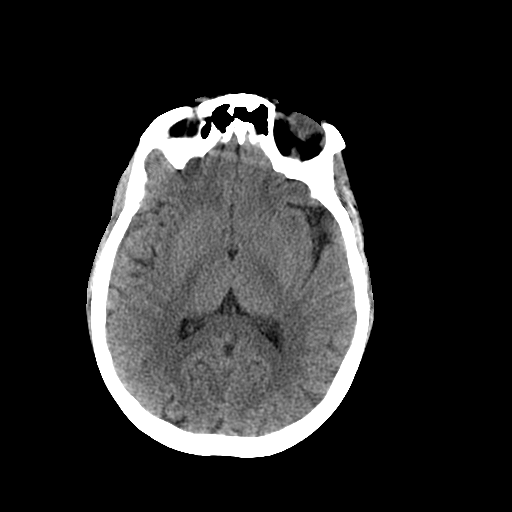
[im 17/40  bone]
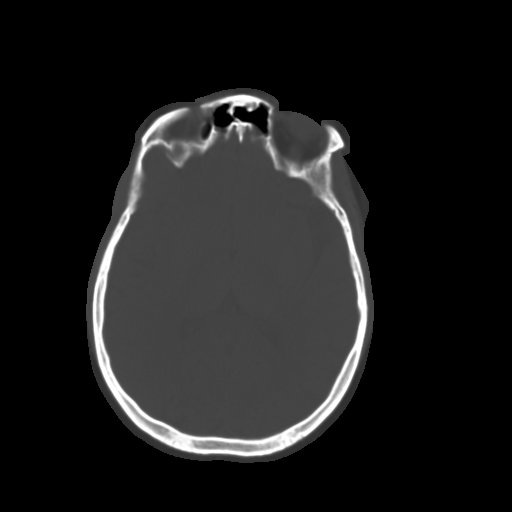
[im 20/40  brain]
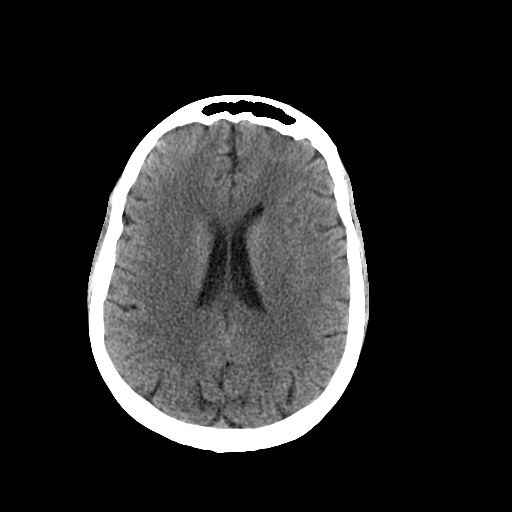
[im 23/40  brain]
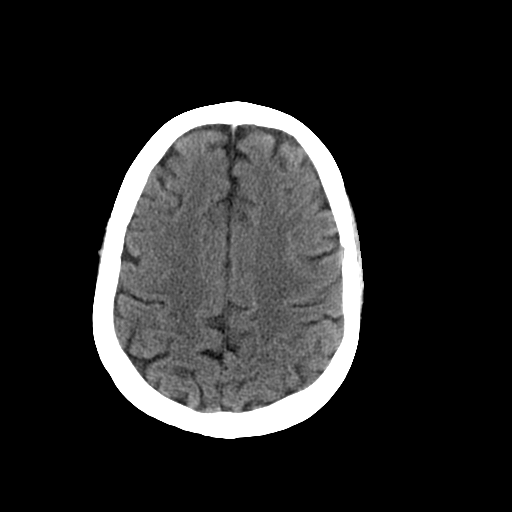
[im 26/40  brain]
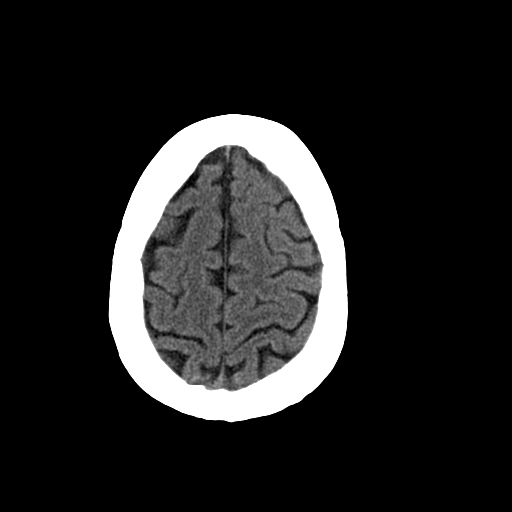
[im 31/40  brain]
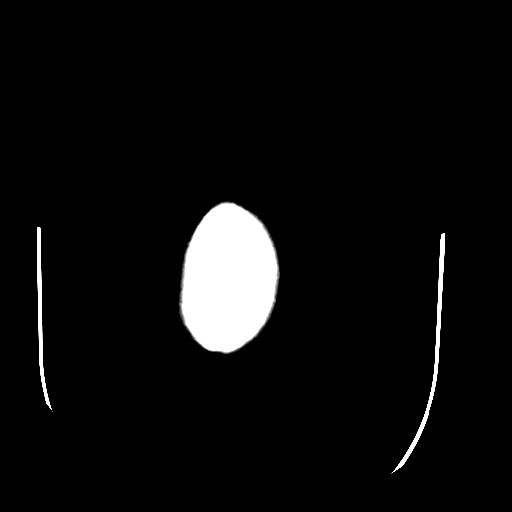
[im 31/40  bone]
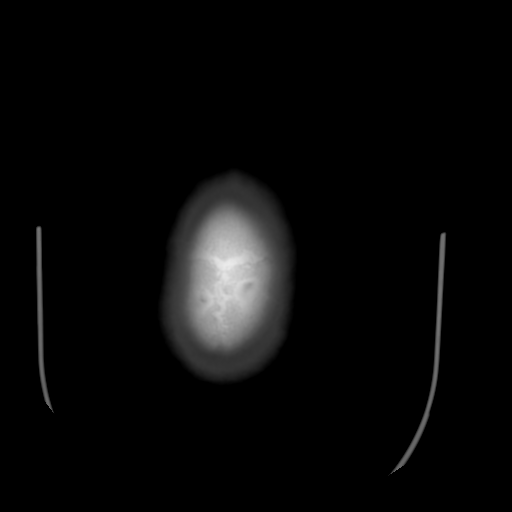
[im 34/40  brain]
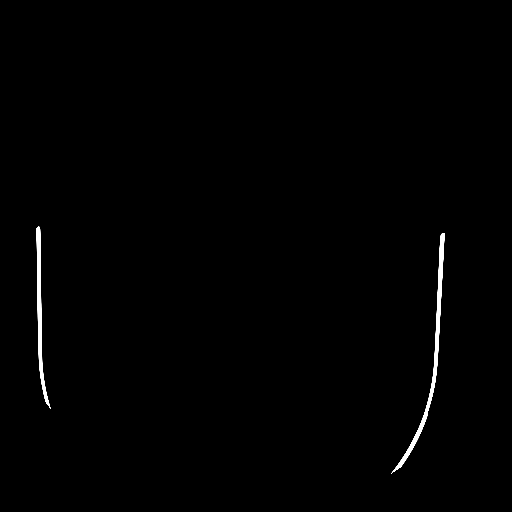
[im 37/40  brain]
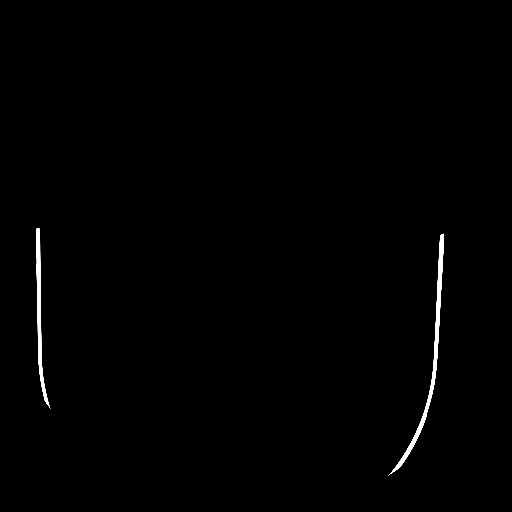

[Series 601: cor head · coronal · 0.49mm/px · 3 of 108 slices shown]
[im 27/108  brain]
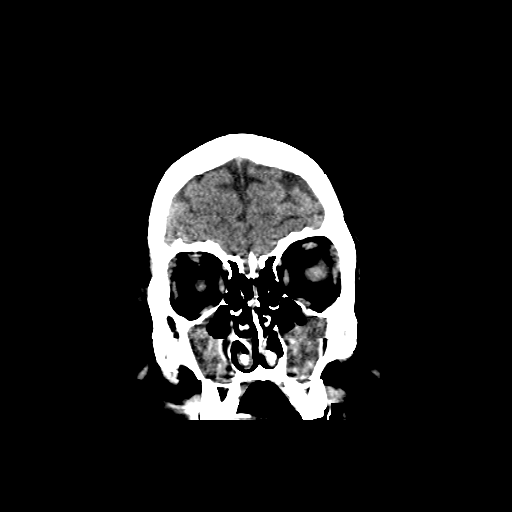
[im 54/108  brain]
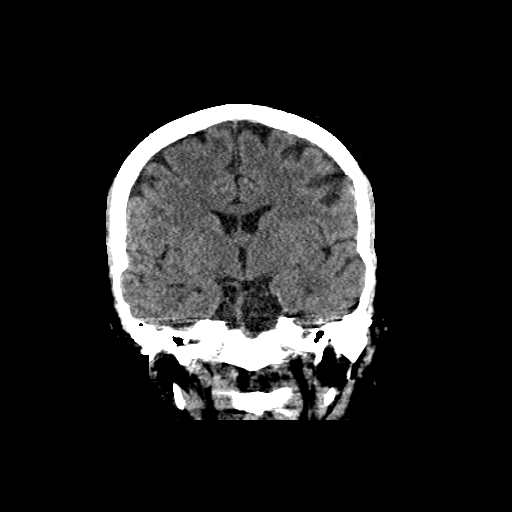
[im 81/108  brain]
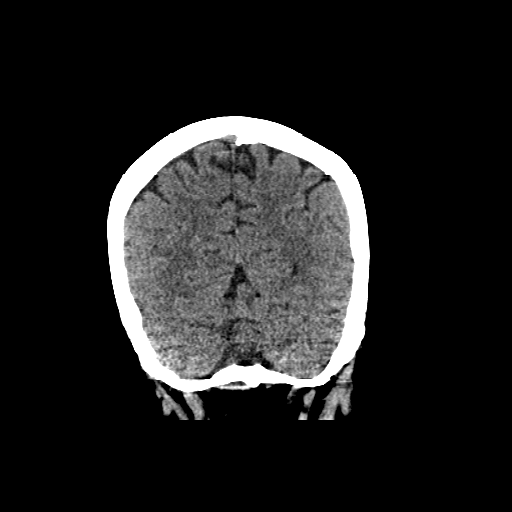

[Series 605: sag head · sagittal · 0.49mm/px · 2 of 93 slices shown]
[im 31/93  brain]
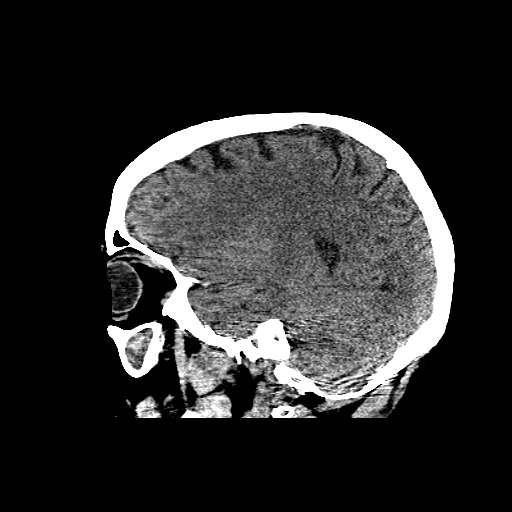
[im 62/93  brain]
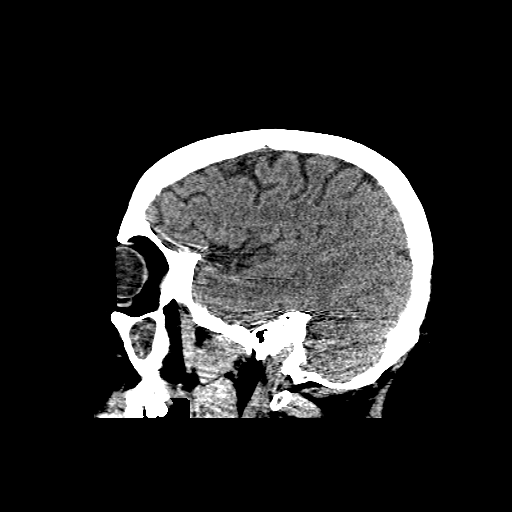

[16 of 47 positions shown; findings below may reference images not displayed]

FINDINGS: No evidence of intracranial hemorrhage, mass effect or midline shift. The ventricles and CSF spaces are unremarkable. The calvarium is unremarkable. There is complete opacification of the mastoid air cells bilaterally. There is mild mucosal thickening in the left sphenoid sinus. The mastoid air cells and the other visualized paranasal sinuses are clear.
IMPRESSION: 
IMPRESSION: No evidence of intracranial hemorrhage, mass effect or midline shift.
Sinus disease as described above.
Is the patient pregnant?
Unknown

## 2022-07-07 IMAGING — CT CT ABDOMEN PELVIS WITHOUT CONTRAST
2 of 3 series · 17 of 46 positions shown, 19 images · non-contrast
Comparison: CT abdomen and pelvis 06/03/2022.

Pt states no abd pain/chronic liver disease/prominent abd striae/CUSHING' S
FINAL REPORT:
EXAM: CT ABDOMEN AND PELVIS WITHOUT CONTRAST
HISTORY: Abdominal pain. History of autoimmune hepatitis.
TECHNIQUE: Contiguous axial images were obtained through the abdomen and pelvis without administration of intravenous contrast. Sagittal and coronal reformatted images were generated. All CT scans at this facility use dose modulation and/or weight based dosing when appropriate to reduce radiation dose to as low as reasonably achievable.
Evaluation of solid organs is limited without IV contrast.

[Series 2: abd pelvis without · axial · non-contrast · 0.96mm/px · z∈[-446,+39]mm · 14 of 224 slices shown, 16 images]
[im 15/224  soft-tissue]
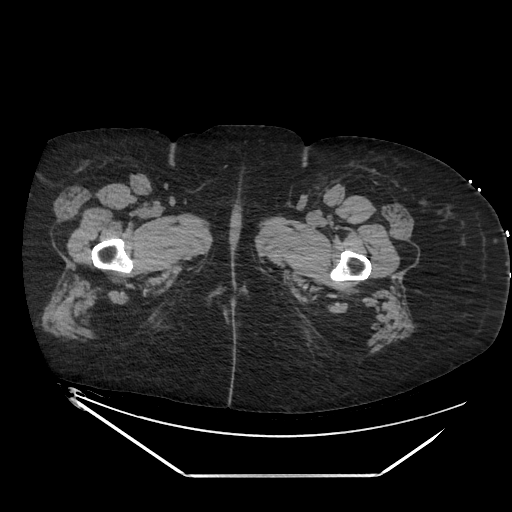
[im 15/224  bone]
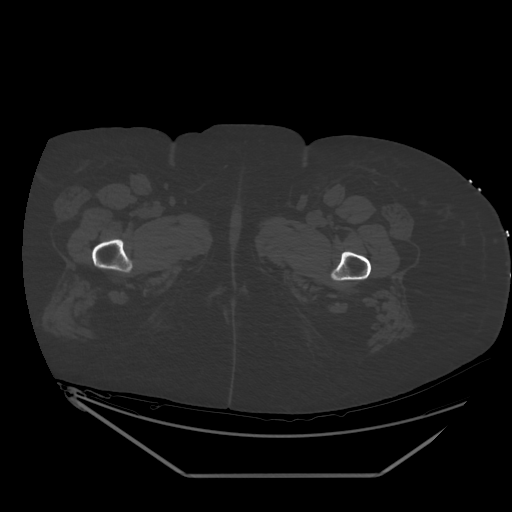
[im 29/224  soft-tissue]
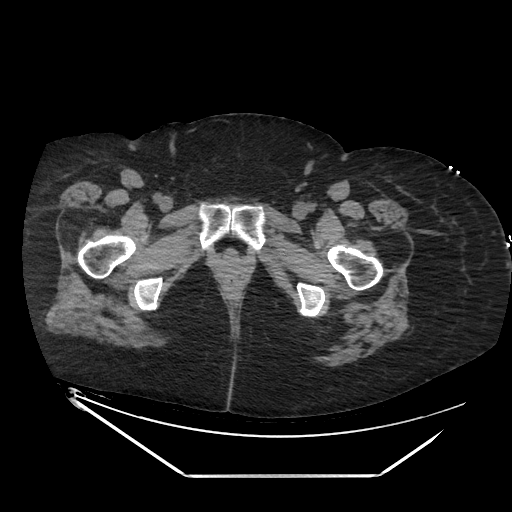
[im 44/224  soft-tissue]
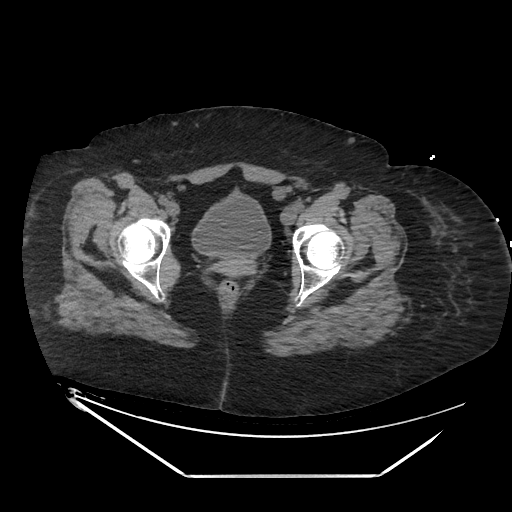
[im 58/224  soft-tissue]
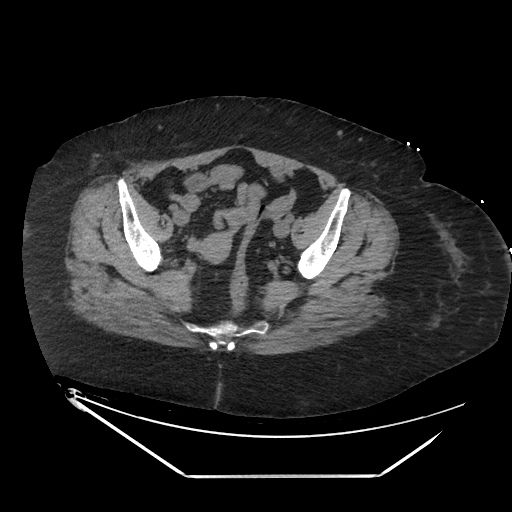
[im 72/224  soft-tissue]
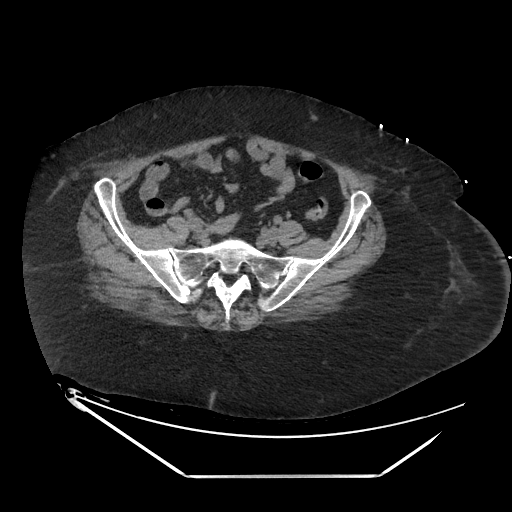
[im 87/224  soft-tissue]
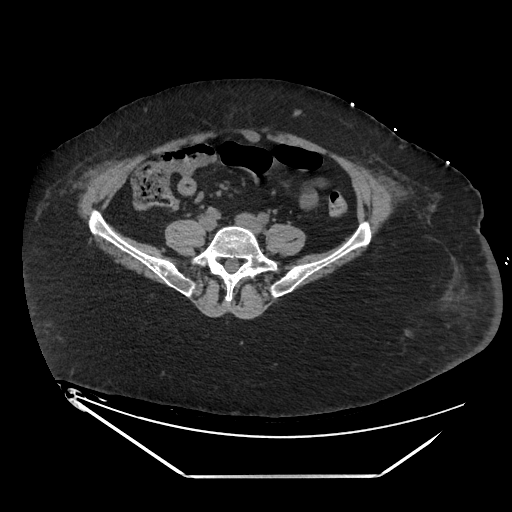
[im 101/224  soft-tissue]
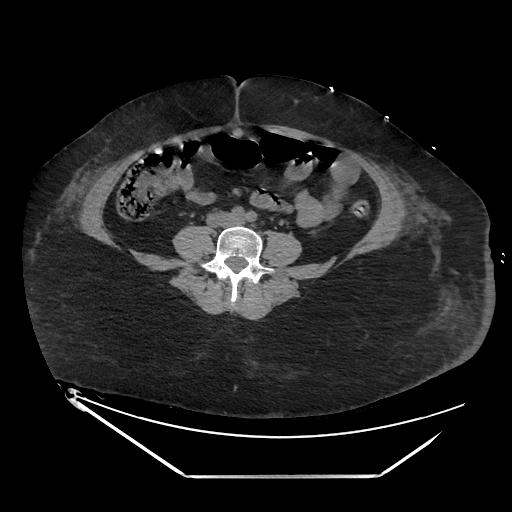
[im 123/224  soft-tissue]
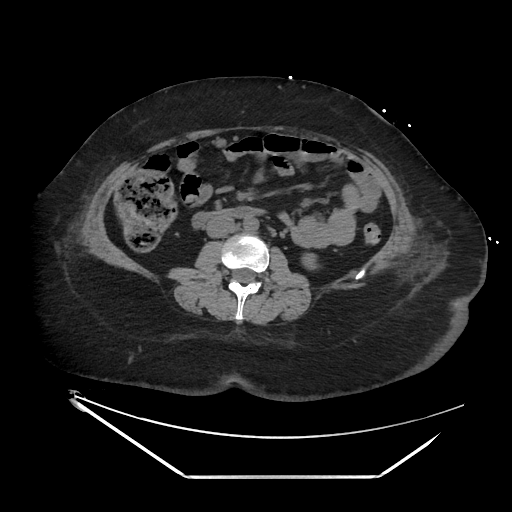
[im 137/224  soft-tissue]
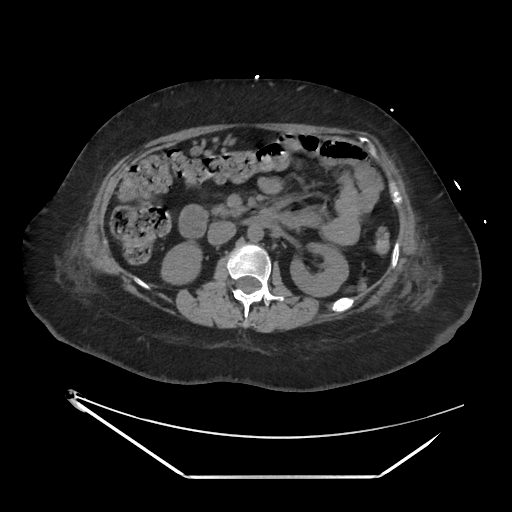
[im 137/224  bone]
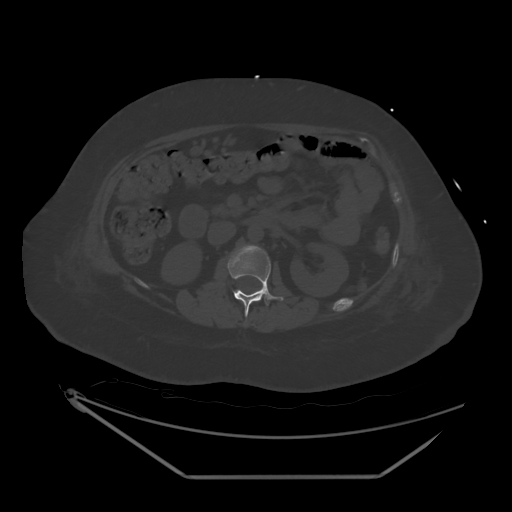
[im 152/224  soft-tissue]
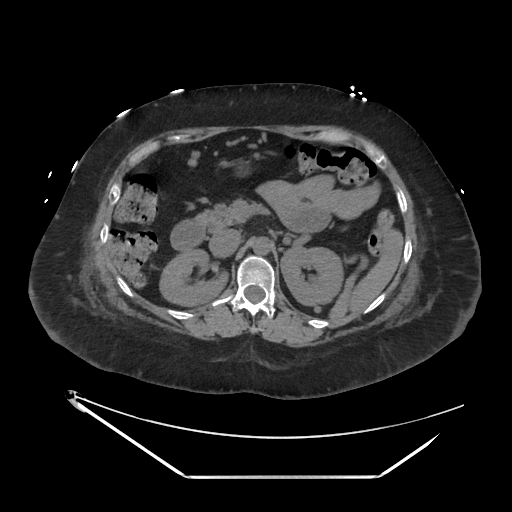
[im 166/224  soft-tissue]
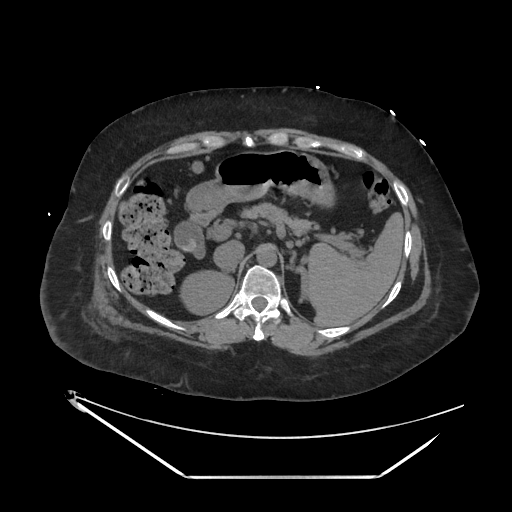
[im 180/224  soft-tissue]
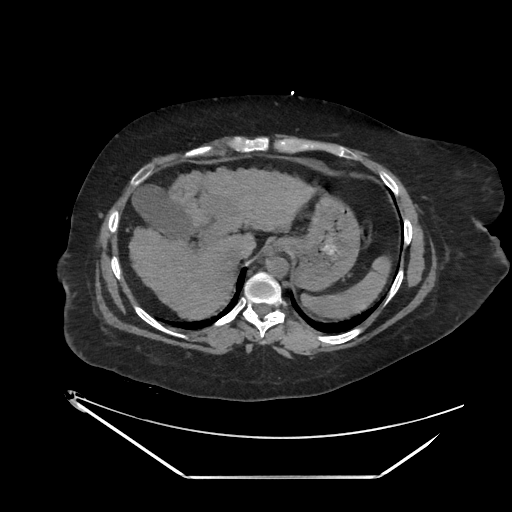
[im 195/224  soft-tissue]
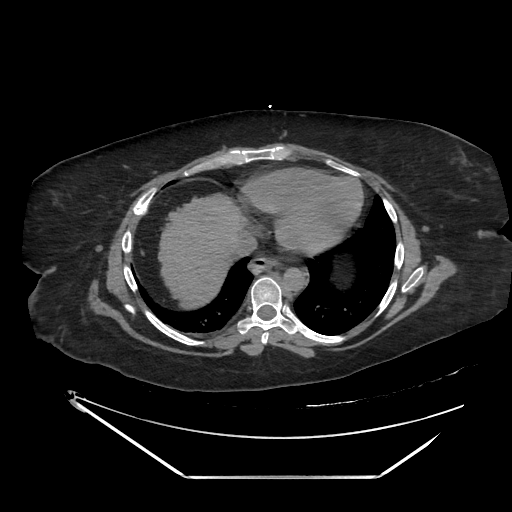
[im 209/224  soft-tissue]
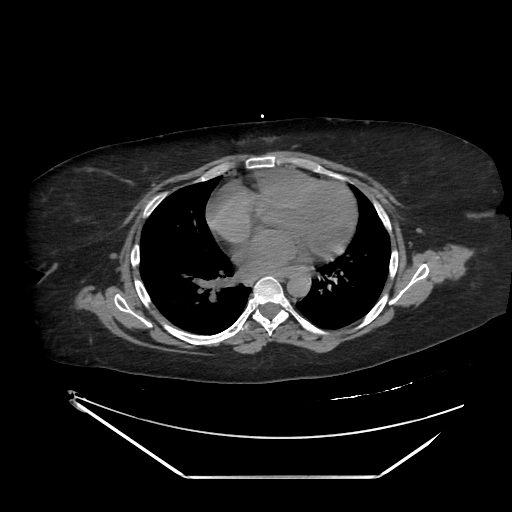

[Series 601: coronal · coronal · 1.09mm/px · 3 of 154 slices shown]
[im 52/154  soft-tissue]
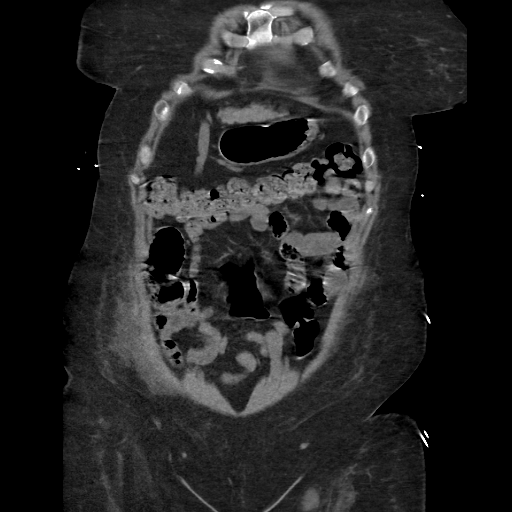
[im 69/154  soft-tissue]
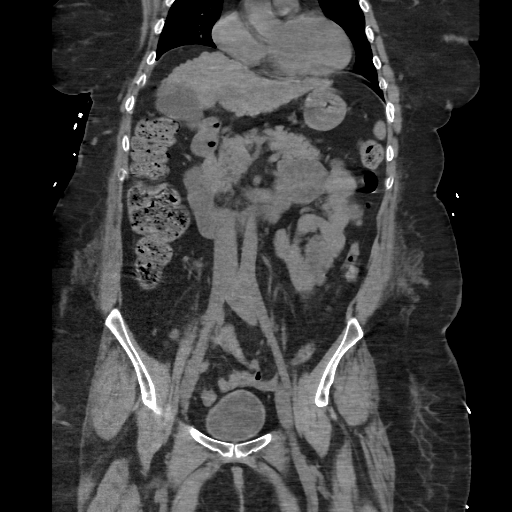
[im 86/154  soft-tissue]
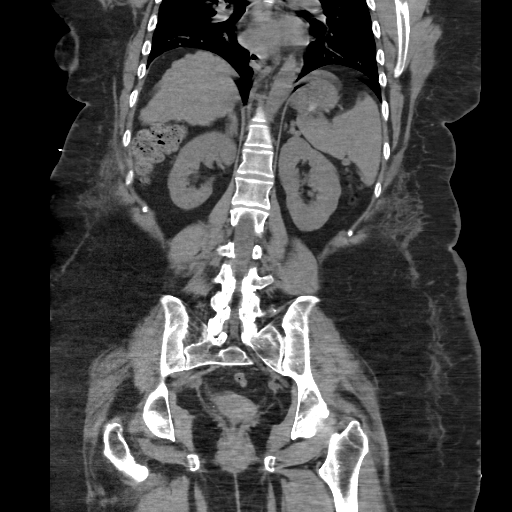

[17 of 46 positions shown; findings below may reference images not displayed]

FINDINGS: LOWER THORACIC: Lung bases are clear. Visualized heart appears mildly enlarged.
LIVER: Shrunken cirrhotic liver.
GALLBLADDER: Cholelithiasis without findings of cholecystitis.
BILE DUCTS:No intrahepatic or extrahepatic dilation
PANCREAS: Unremarkable.
SPLEEN: Top normal in size.
ADRENAL GLANDS: Unremarkable.
KIDNEYS:No hydronephrosis. No renal calculi.
BLADDER: Unremarkable.
PELVIC ORGANS:   Uterus is unremarkable. No adnexal mass.
GASTROINTESTINAL: No small or large bowel obstruction. Normal appendix.
LYMPH NODES:Prominent porta hepatis lymph nodes.
PERITONEUM:No ascites or pneumoperitoneum
VASCULATURE: No abdominal aortic aneurysm. Findings of portal hypertension, including esophageal varices, recanalized umbilical vein, and splenorenal collaterals.
ABDOMINAL WALL: Larger nonspecific 3.2 x 2.8 cm cystic structure in the left groin (series 2, image 19), previously 2.4 x 1.2 cm.
BONES: No acute osseous abnormality. Subacute fractures of the right sixth and seventh ribs.
IMPRESSION: 1.  No acute findings in the abdomen or pelvis.
2.  Cirrhosis with findings of portal hypertension as described above.
Is the patient pregnant?
No

## 2022-10-09 IMAGING — MR MRI ABDOMEN WITHOUT CONTRAST
5 of 7 series · 39 of 48 positions shown · non-contrast
Comparison: CT abdomen and pelvis from 10/09/2022

Abd pain, gallstones
FINAL REPORT:
INDICATION: Cholelithiasis, pain
EXAM: Noncontrast MRI of the abdomen per MRCP protocol. 3-D MIP reformats were performed.

[Series 1001: survey · axial · 8.0mm · 0.84mm/px · z∈[+10,+217]mm · 3 of 23 slices shown]
[im 1/23]
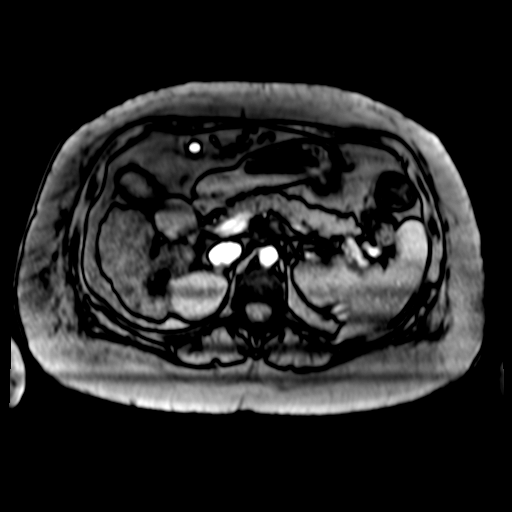
[im 12/23]
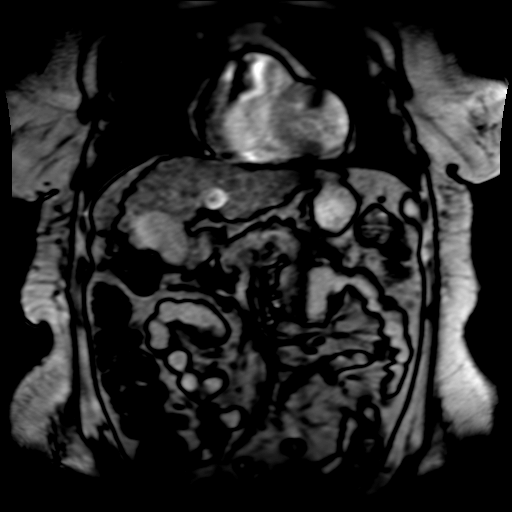
[im 23/23]
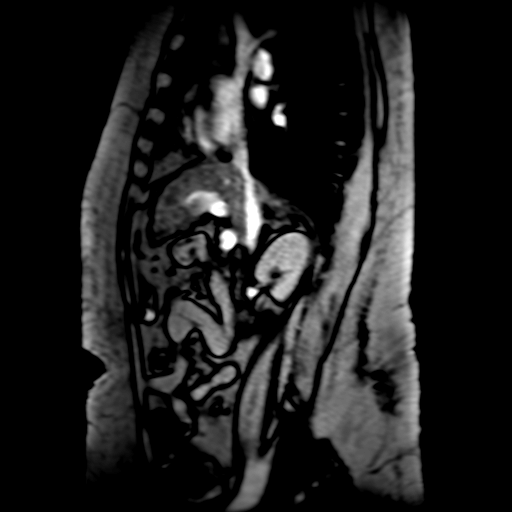

[Series 3001: T2 · coronal · 6.0mm · 1.56mm/px · 5 of 35 slices shown (1 of 2)]
[im 1/35]
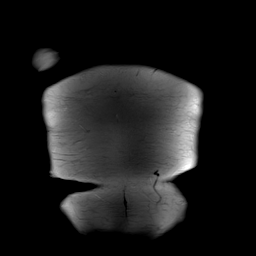
[im 9/35]
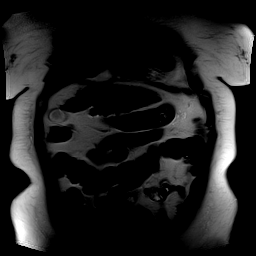
[im 18/35]
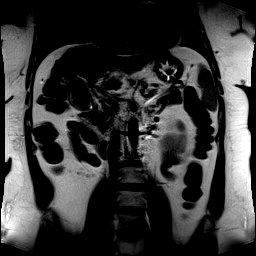
[im 26/35]
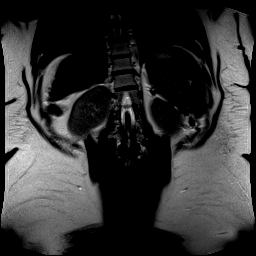
[im 35/35]
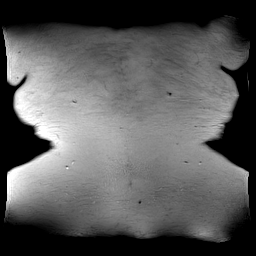

[Series 4001: ax btfe · axial · 6.0mm · 1.19mm/px · z∈[-23,+151]mm · 4 of 30 slices shown]
[im 1/30]
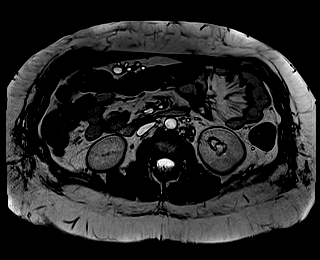
[im 10/30]
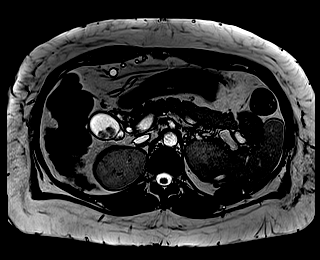
[im 20/30]
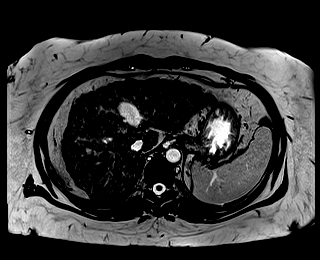
[im 30/30]
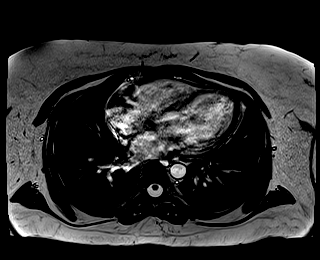

[Series 5001: T1 dynamic · axial · 3.0mm · 1.19mm/px · z∈[-55,+182]mm · 23 of 160 slices shown]
[im 1/160]
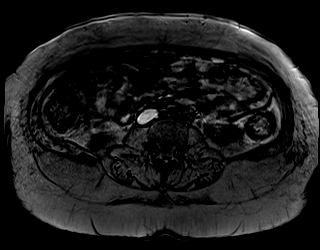
[im 8/160]
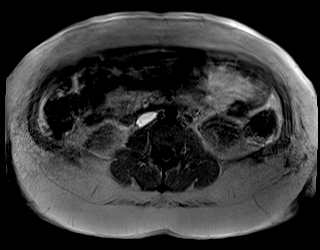
[im 15/160]
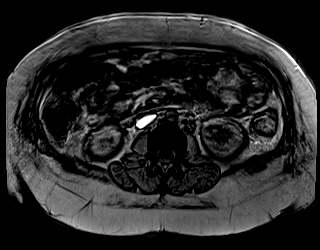
[im 22/160]
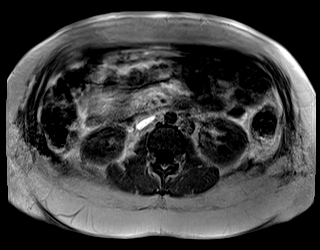
[im 29/160]
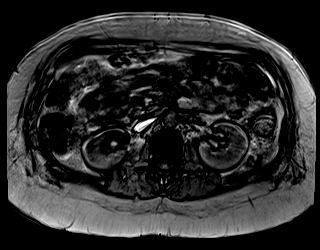
[im 37/160]
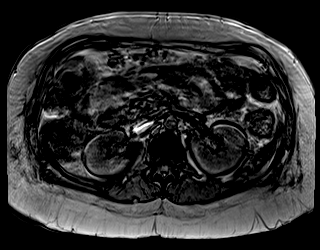
[im 44/160]
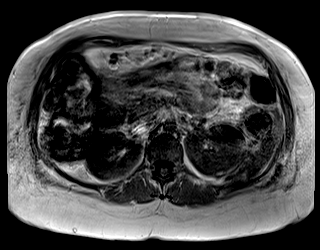
[im 51/160]
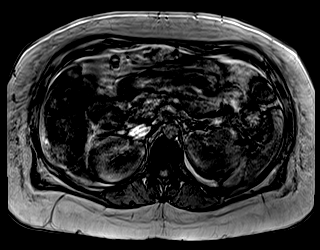
[im 58/160]
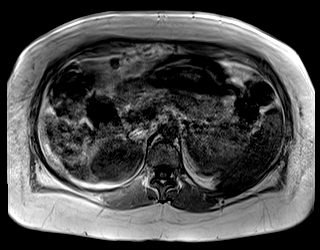
[im 66/160]
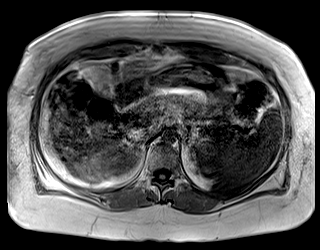
[im 73/160]
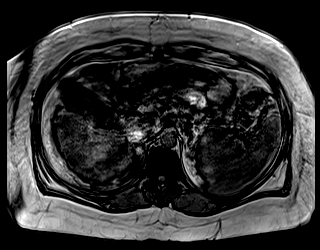
[im 80/160]
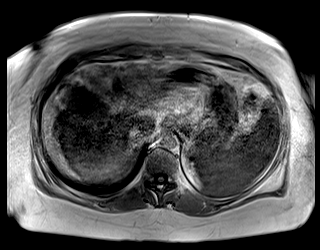
[im 87/160]
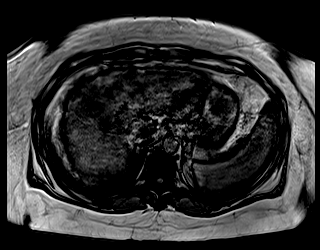
[im 94/160]
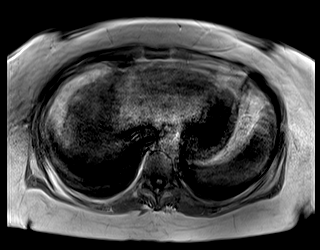
[im 102/160]
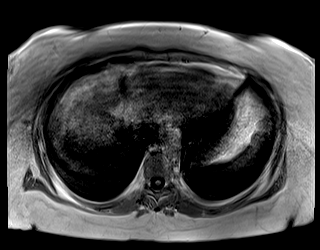
[im 109/160]
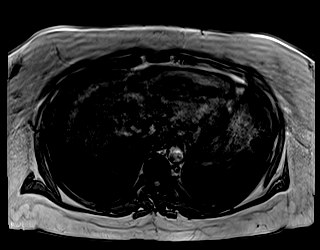
[im 116/160]
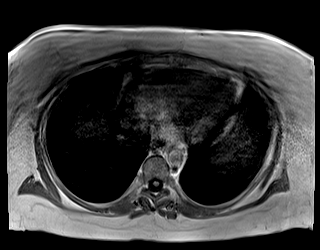
[im 123/160]
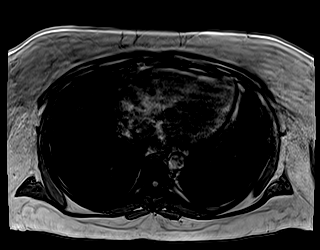
[im 131/160]
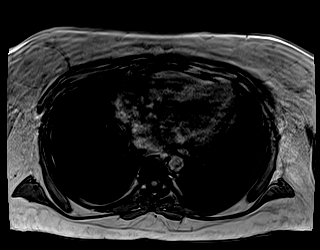
[im 138/160]
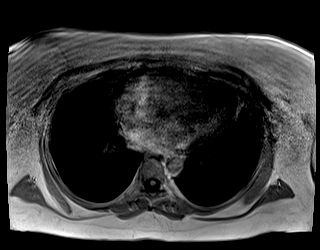
[im 145/160]
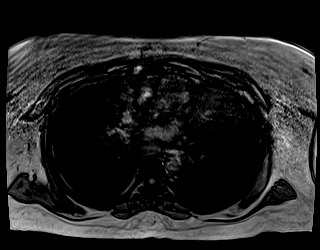
[im 152/160]
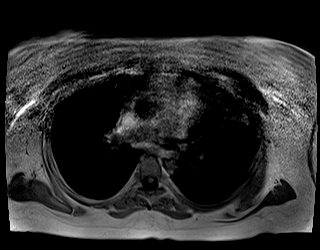
[im 160/160]
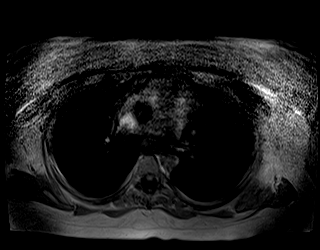

[Series 6001: T2 · axial · 6.0mm · 1.48mm/px · z∈[-23,+151]mm · 4 of 30 slices shown (2 of 2)]
[im 1/30]
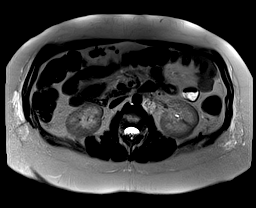
[im 10/30]
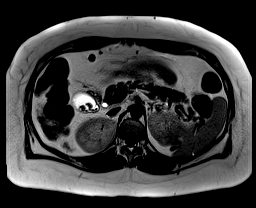
[im 20/30]
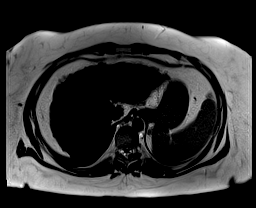
[im 30/30]
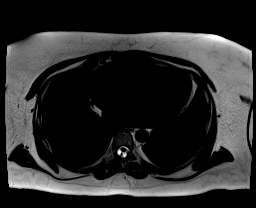

[39 of 48 positions shown; findings below may reference images not displayed]

FINDINGS: Nodular contour to the liver with innumerable subcentimeter nodules. Portal vein is dilated and there is recanalization of the umbilical artery.
Adrenal glands are unremarkable. Visualized kidneys and pancreas are unremarkable. Spleen is unremarkable.
Following the fax in the gallbladder consistent with gallstones. No intra or extrahepatic ductal dilation identified. There appears to be a small filling defect at the downstream common bile duct just before the ampulla measuring up to 6 mm suspicious for a common bile duct stone. There is significant motion artifact however which could account for the filling defect.
IMPRESSION: 1. Cholelithiasis. Questionable filling defect downstream common bile duct suggestive of choledocholithiasis versus motion artifact. No intra or extrahepatic ductal dilation.
2. Cirrhotic morphology of the liver with evidence of portal hypertension.
Is the patient pregnant?
No

## 2022-10-09 IMAGING — CT CT ABDOMEN PELVIS WITHOUT CONTRAST
2 of 3 series · 17 of 46 positions shown, 19 images · non-contrast
Comparison: 07/07/2022

FINAL REPORT:
CT ABDOMEN PELVIS WITHOUT CONTRAST
INDICATION: abd pain
TECHNIQUE: CT imaging was obtained from the lung bases to the pubic symphysis without intravenous contrast. All CT scans at this facility use dose modulation and/or weight based dosing when appropriate to reduce radiation dose to as low as reasonably achievable. (#SRS.[id number]#)

[Series 2: renal stone · axial · 0.87mm/px · z∈[-425,-28]mm · 14 of 183 slices shown, 16 images]
[im 12/183  soft-tissue]
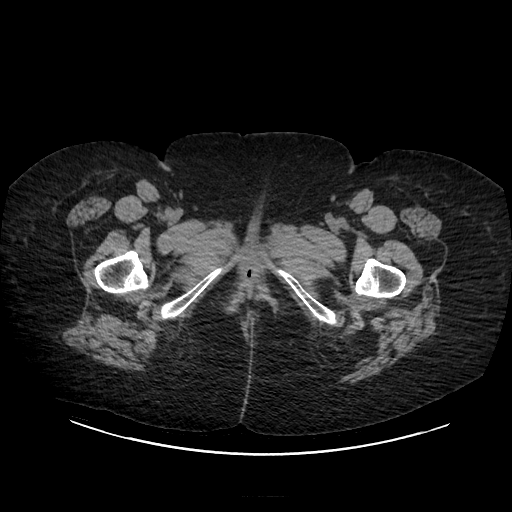
[im 12/183  bone]
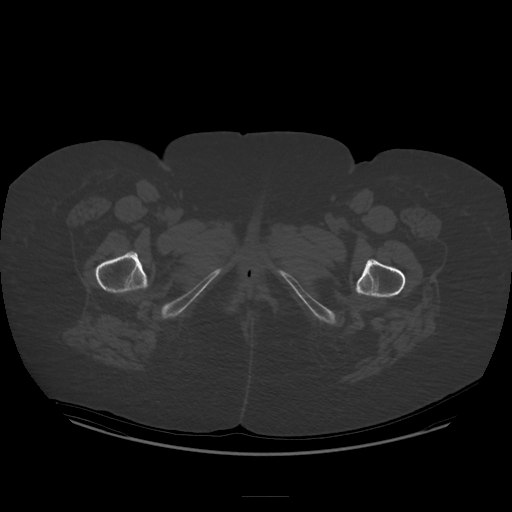
[im 24/183  soft-tissue]
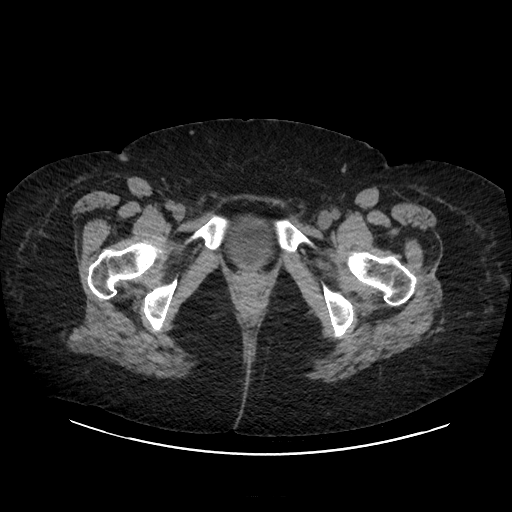
[im 36/183  soft-tissue]
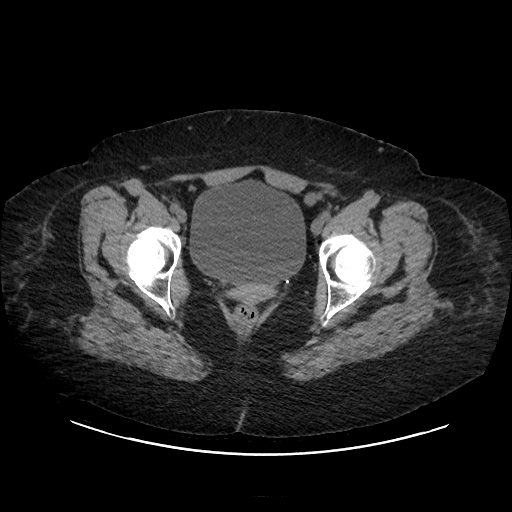
[im 47/183  soft-tissue]
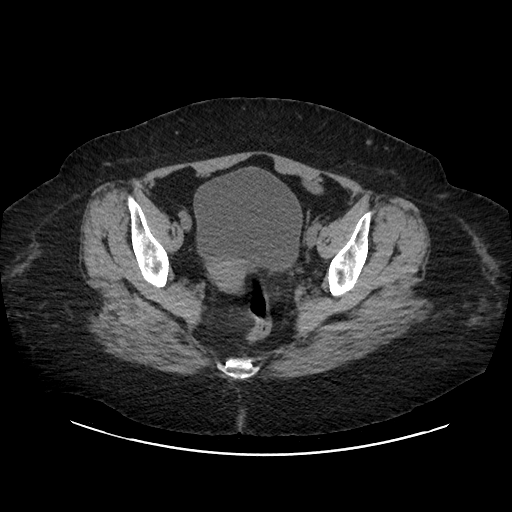
[im 59/183  soft-tissue]
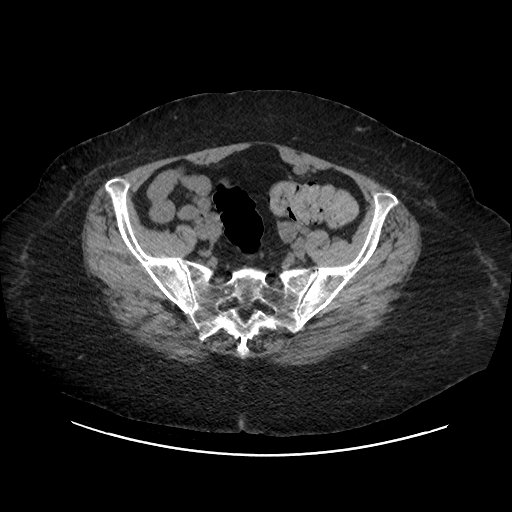
[im 71/183  soft-tissue]
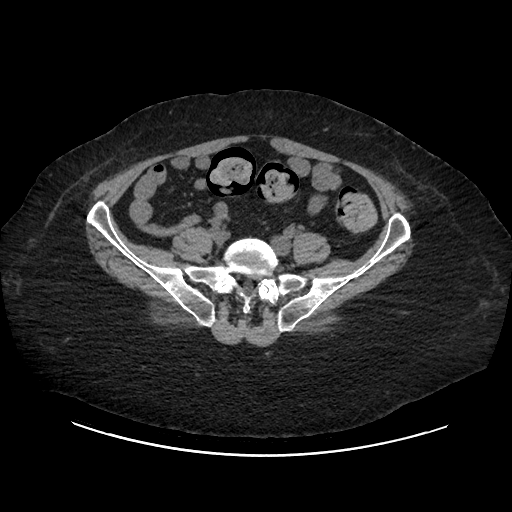
[im 83/183  soft-tissue]
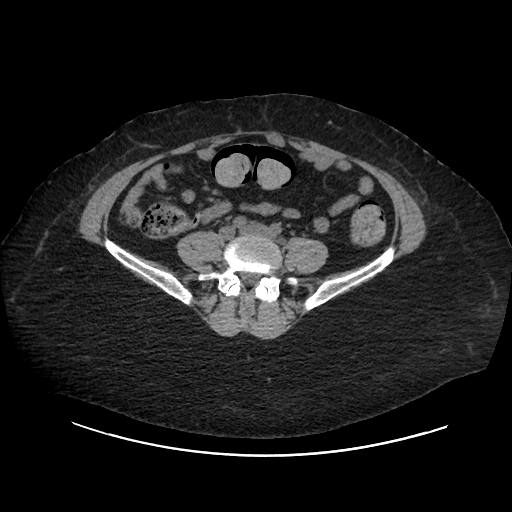
[im 100/183  soft-tissue]
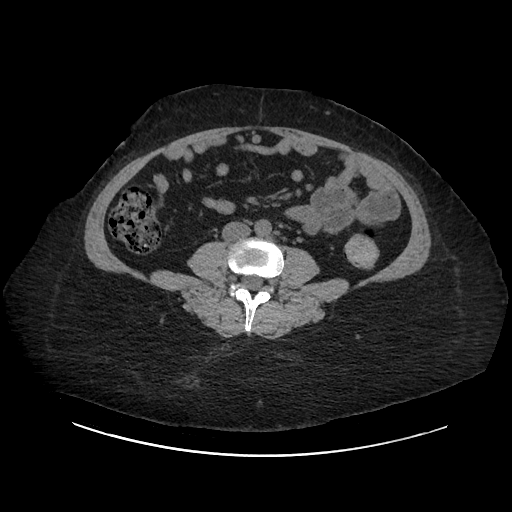
[im 112/183  soft-tissue]
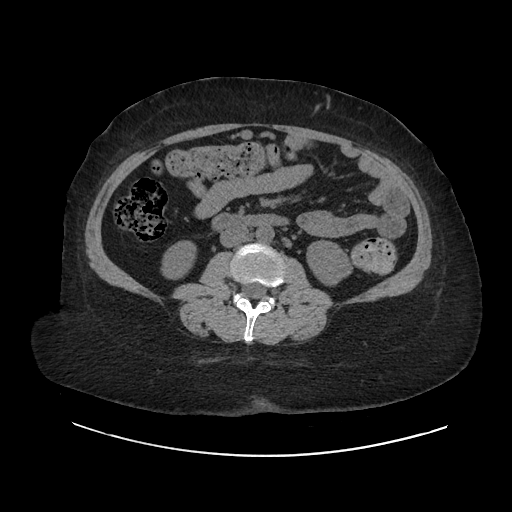
[im 112/183  bone]
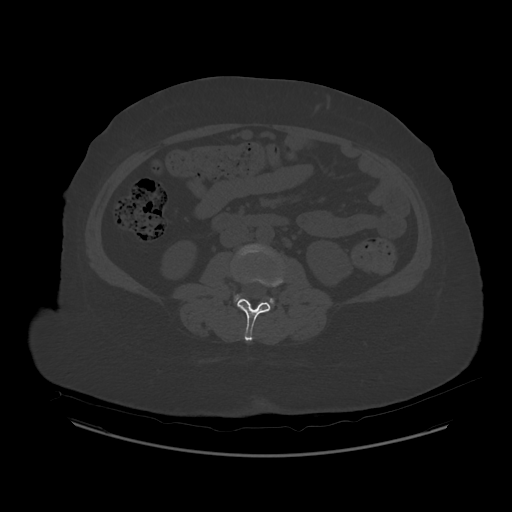
[im 124/183  soft-tissue]
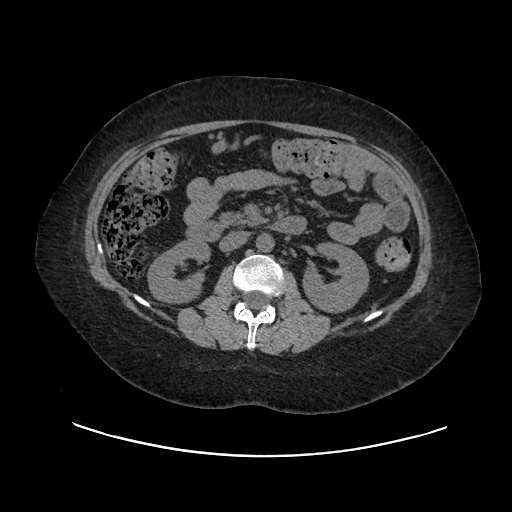
[im 136/183  soft-tissue]
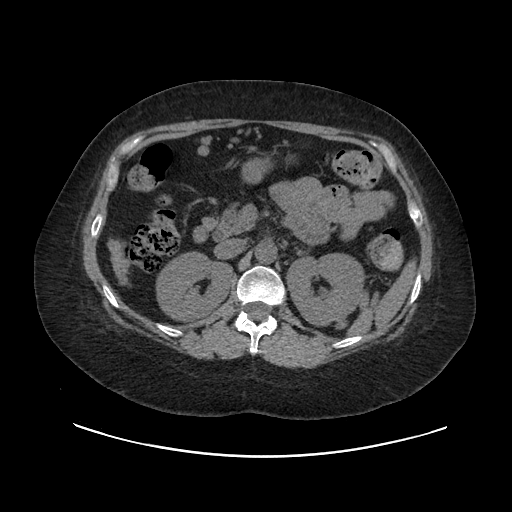
[im 147/183  soft-tissue]
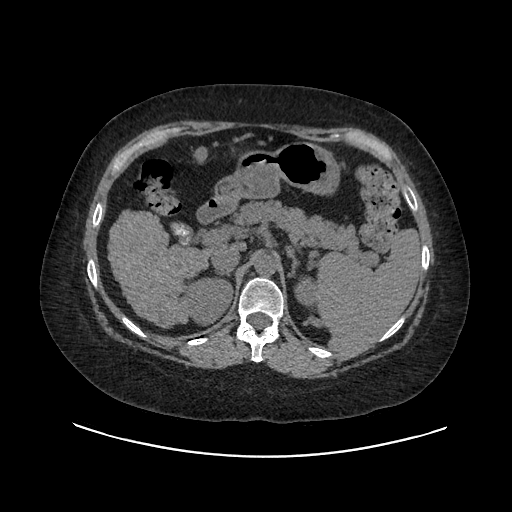
[im 159/183  soft-tissue]
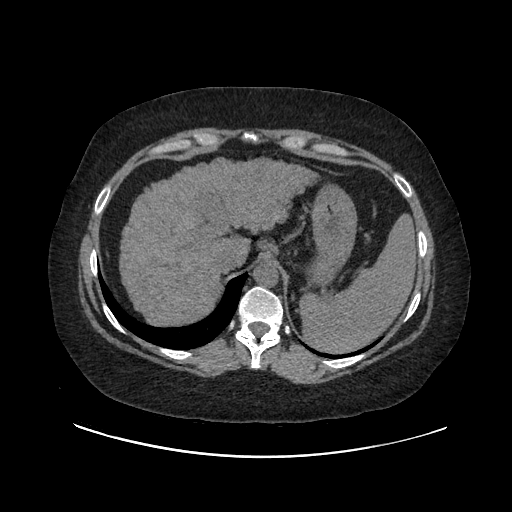
[im 171/183  soft-tissue]
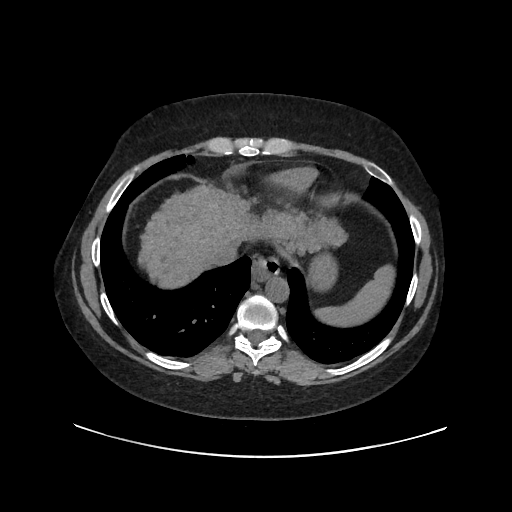

[Series 602: sag standard 2x2 · sagittal · 0.89mm/px · 3 of 224 slices shown]
[im 75/224  soft-tissue]
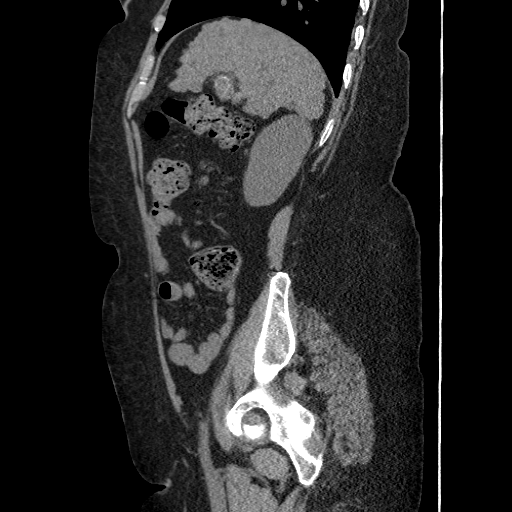
[im 100/224  soft-tissue]
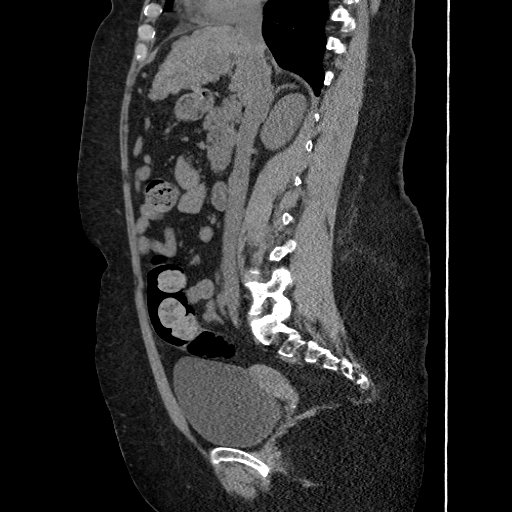
[im 124/224  soft-tissue]
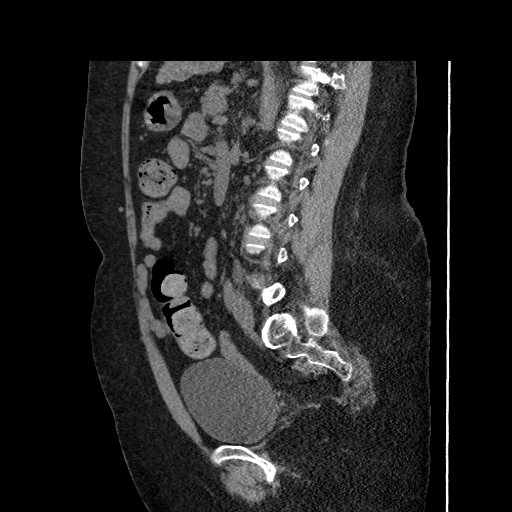

[17 of 46 positions shown; findings below may reference images not displayed]

FINDINGS: Solid organ and vascular assessment limited due to lack of intravenous contrast.
Lower thorax: The lung bases are clear. The heart appears normal.
Liver: Cirrhotic liver with significant heterogeneity. No dominant mass appreciated.
Gallbladder/biliary: Gallstones without secondary signs of acute cholecystitis.
Spleen: Normal.
Pancreas: Normal.
Adrenal glands: Normal.
Kidneys: Normal.
Abdominal wall: Normal.
Vascular: Normal.
Mesentery: No adenopathy, free air, or free fluid.
Stomach/bowel: No bowel dilatation or wall thickening. The appendix is normal. Moderate colonic fecal burden.
Reproductive organs: Normal.
Bladder: Normal.
Pelvic nodes: No adenopathy.
Bones: No acute osseous abnormality or destructive bone lesion.
IMPRESSION: No acute findings.
Cirrhosis.
Cholelithiasis.
Is the patient pregnant?
No

## 2022-10-11 IMAGING — CR XR WRIST 3+ VIEWS RIGHT
1 series · 3 of 3 positions shown · non-contrast
Comparison: none

Pt fell 3 weeks ago
FINAL REPORT:
Exam: 3 views right wrist.
HISTORY: Pain after fall.

[Series 328: PA · right · 0.20mm/px · 3 of 3 slices shown]
[im 1/3]
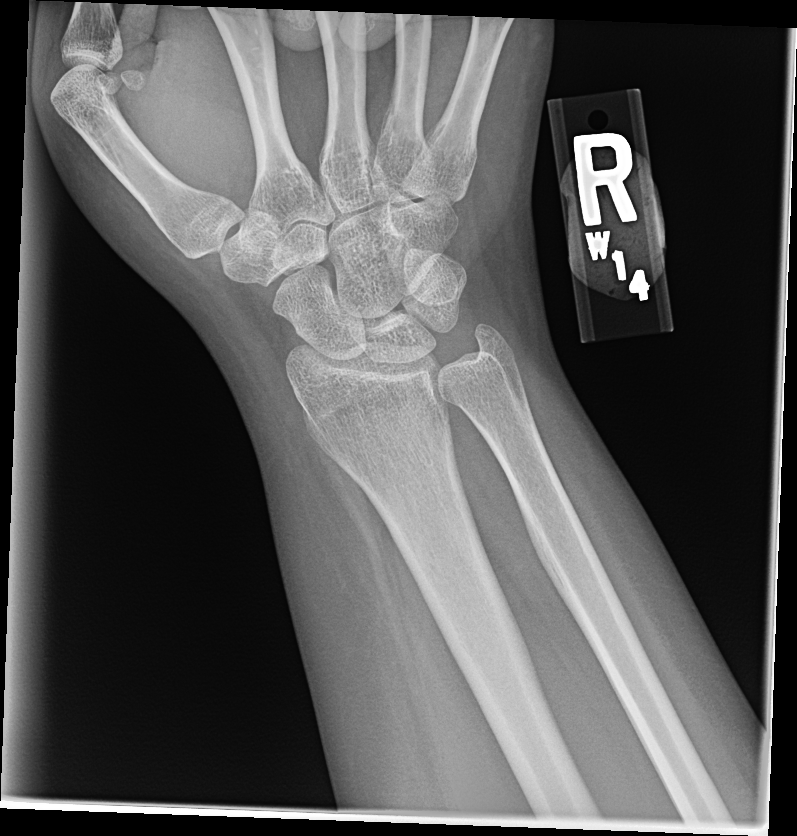
[im 2/3]
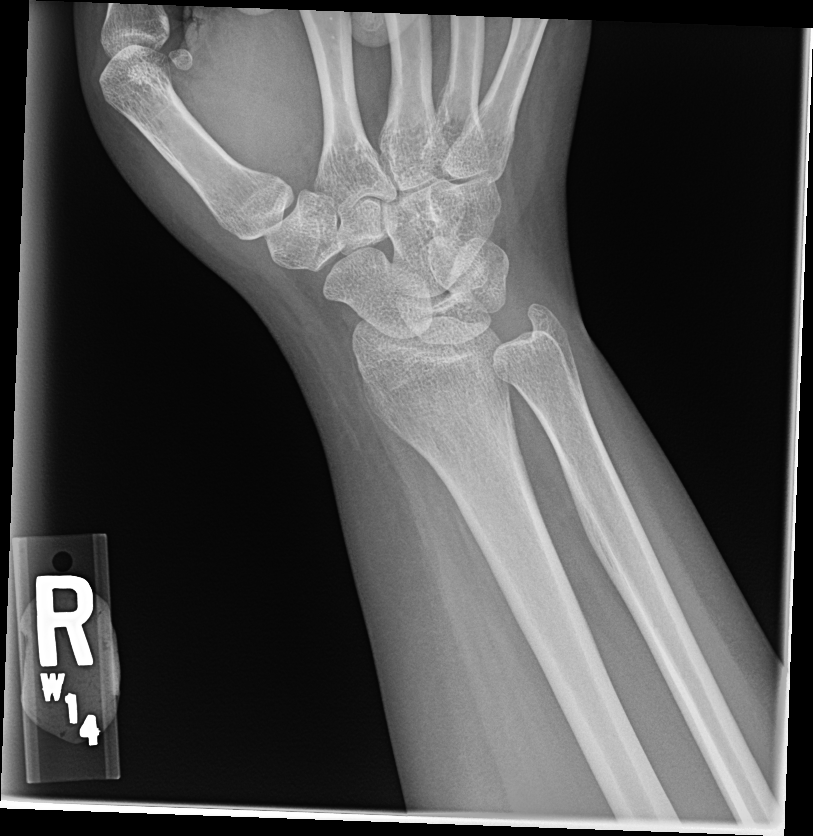
[im 3/3]
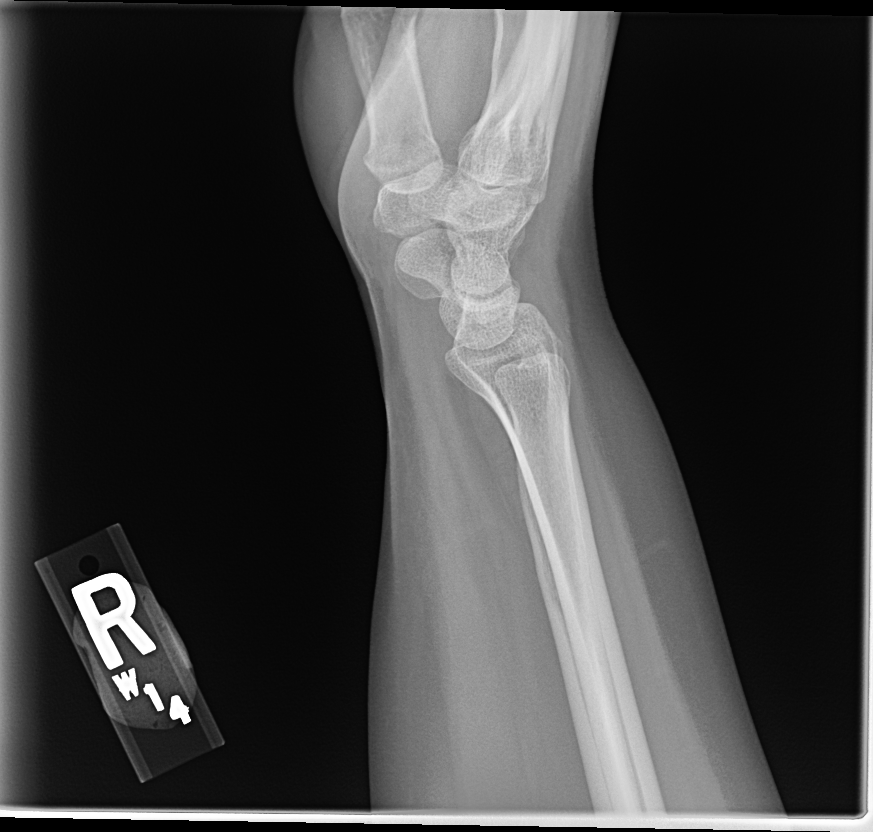

[3 of 3 positions shown; findings below may reference images not displayed]

FINDINGS: No significant soft tissue swelling. No acute fracture or displacement. Joint spaces are maintained.
IMPRESSION: No evidence for acute bony trauma.
Is the patient pregnant?
Unknown

## 2022-10-11 IMAGING — CR XR SHOULDER 2+ VIEWS RIGHT
1 series · 3 of 3 positions shown · non-contrast
Comparison: none

Pt fell 3 weeks ago
FINAL REPORT:
Exam: 3 views right shoulder.
HISTORY: Pain after fall.

[Series 322: AP · 0.19mm/px · 3 of 3 slices shown]
[im 1/3]
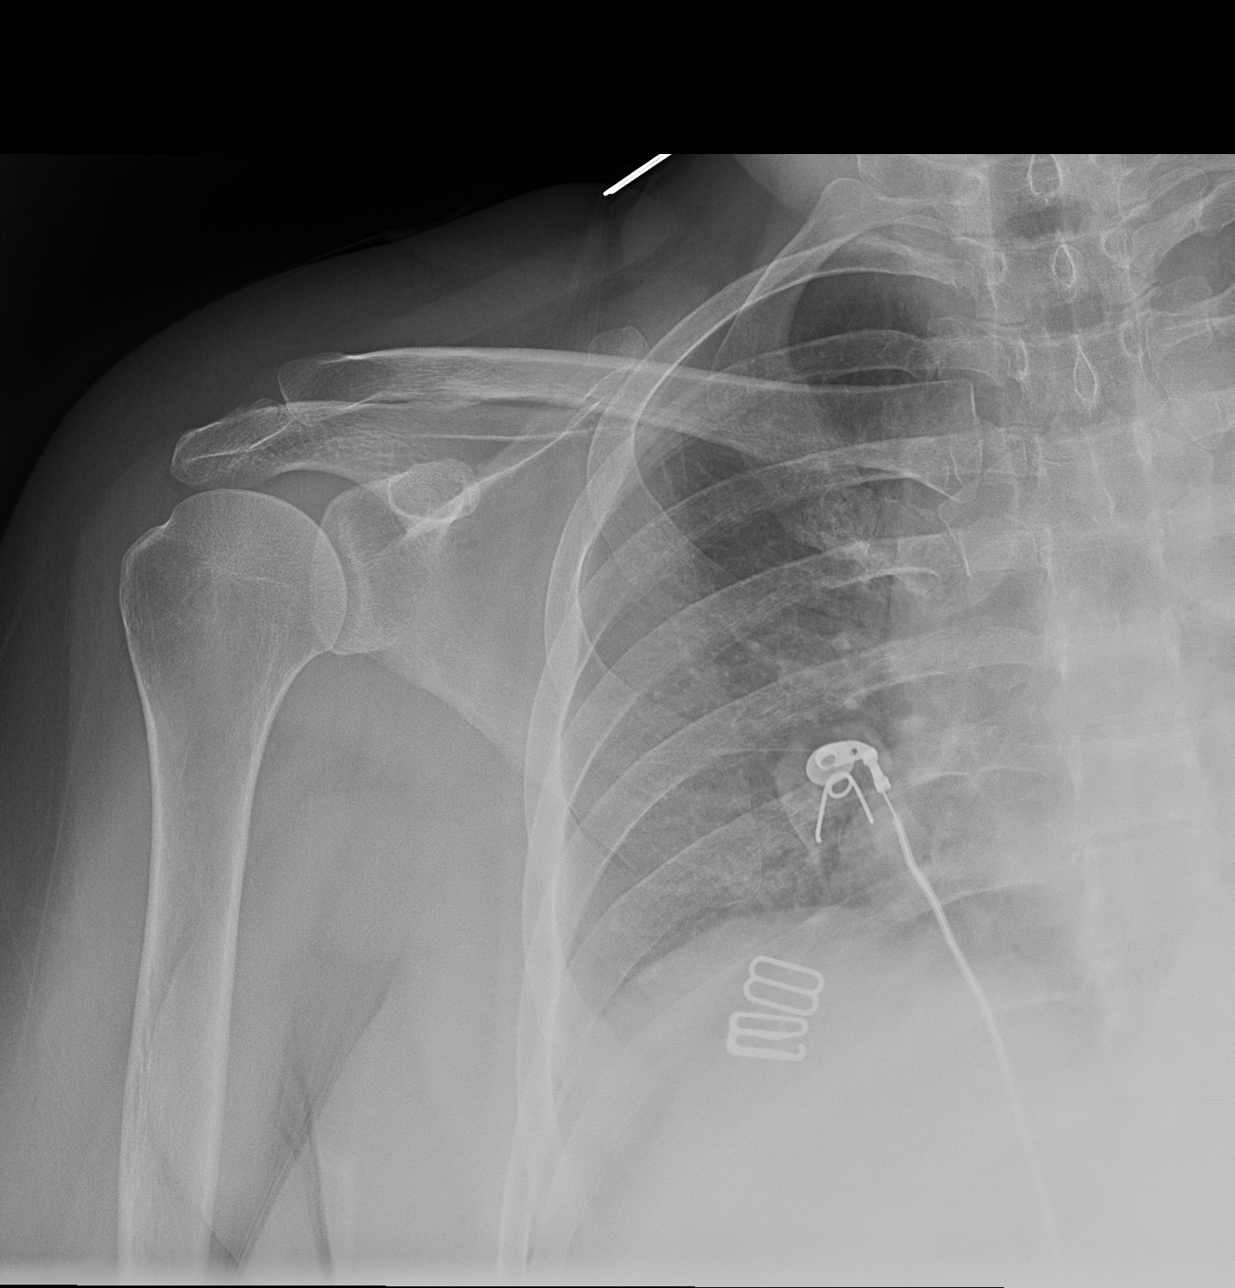
[im 2/3]
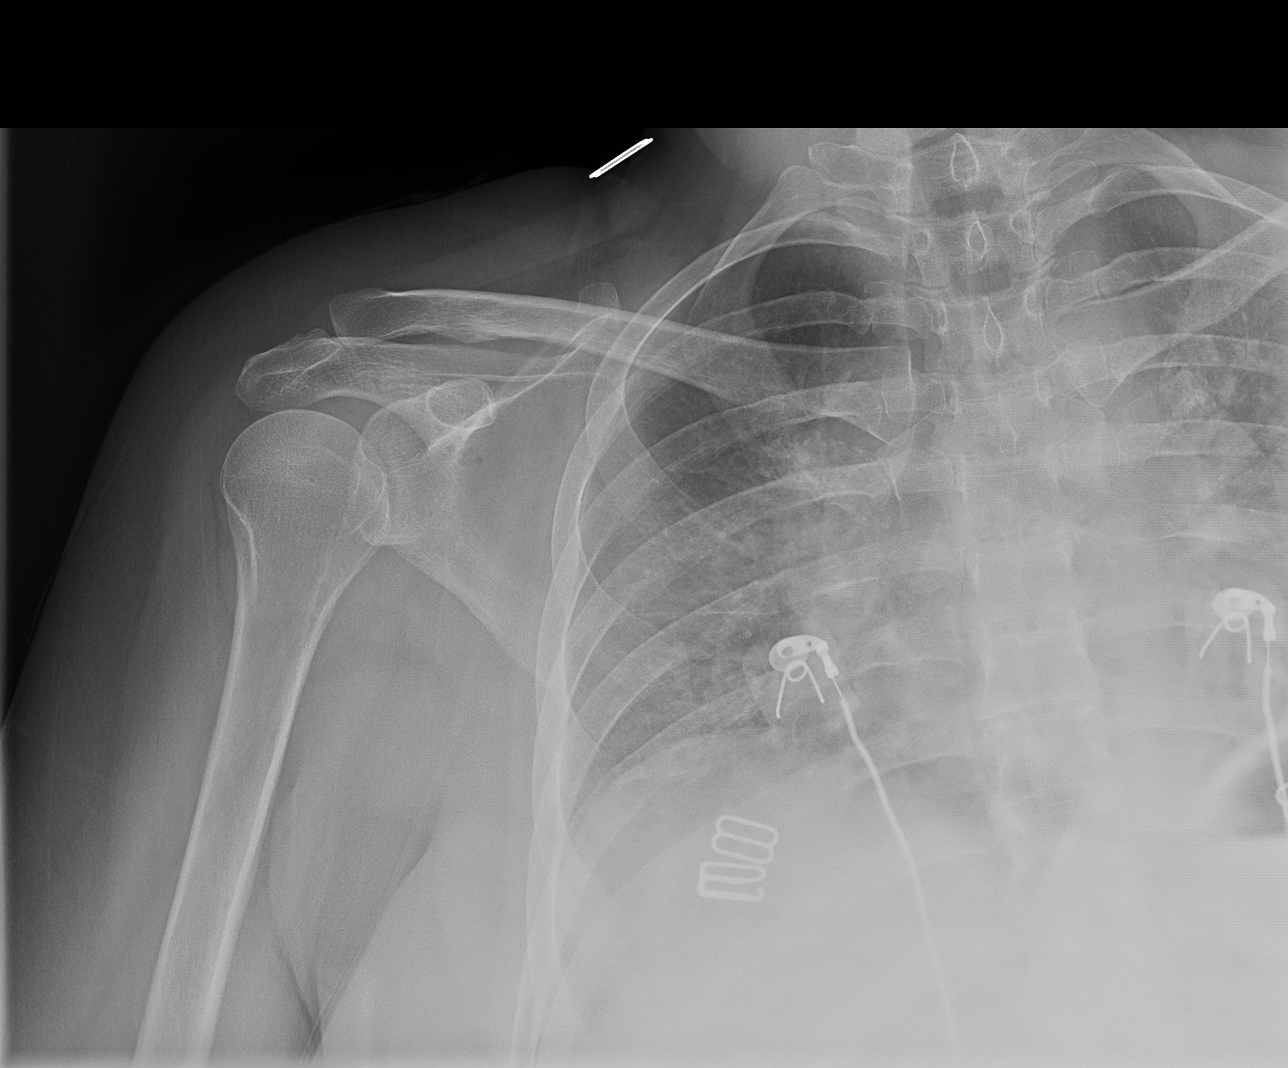
[im 3/3]
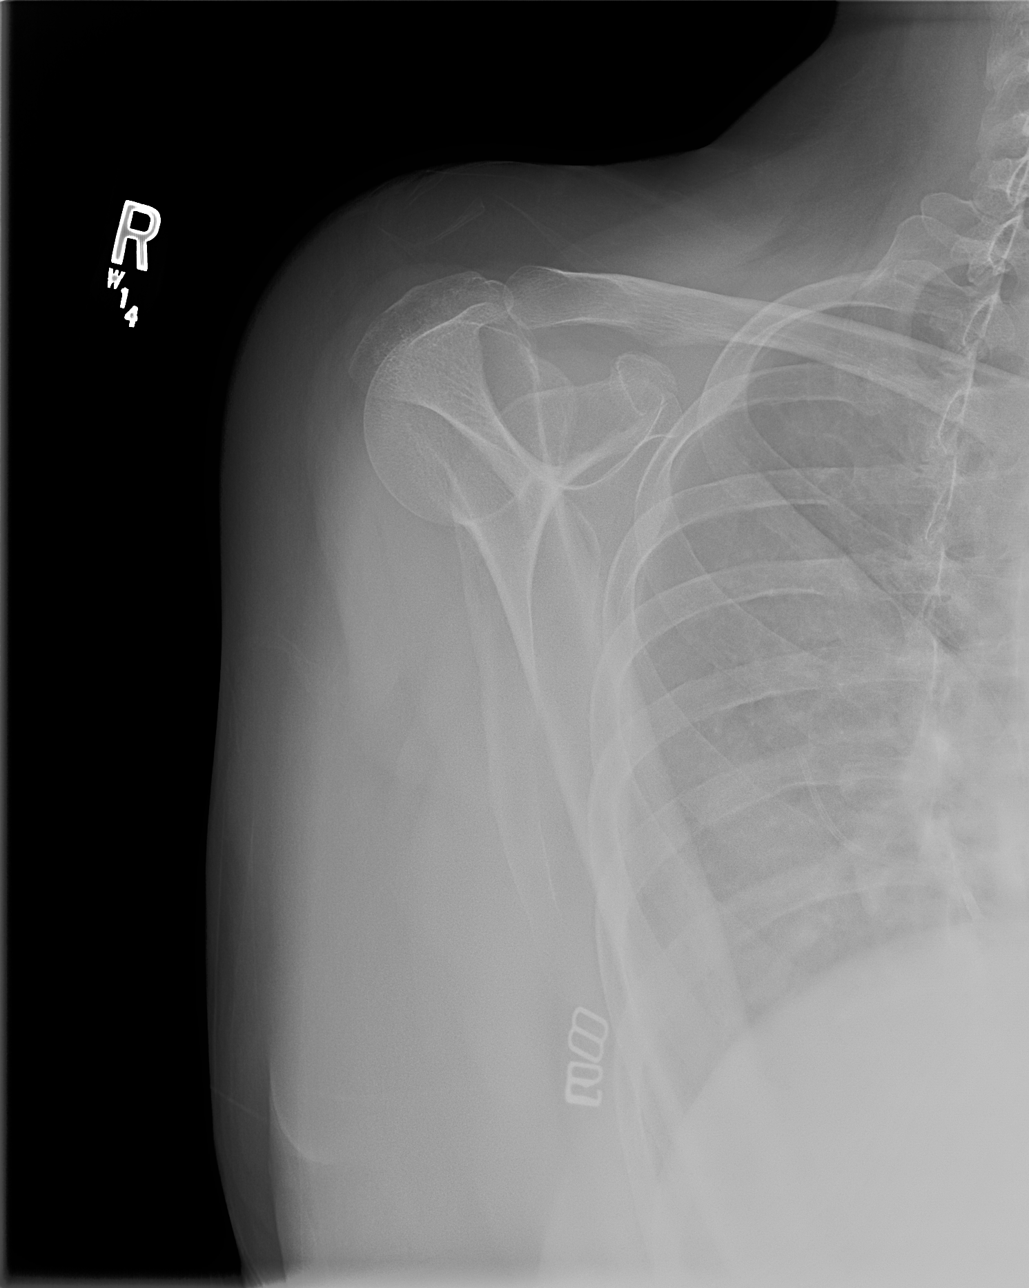

[3 of 3 positions shown; findings below may reference images not displayed]

FINDINGS: No significant soft tissue swelling. No acute fracture or displacement. Joint spaces are maintained. No joint effusion.
IMPRESSION: No evidence for acute fracture or dislocation of the right shoulder.
Is the patient pregnant?
Unknown

## 2022-10-13 IMAGING — MR MRI ABDOMEN WITHOUT CONTRAST
5 of 7 series · 39 of 48 positions shown · non-contrast
Comparison: CT 10/09/2022. MRI 10/09/2022.

REPEAT MRCP FOR RUQ PAIN
FINAL REPORT:
MRCP.
INDICATION: Pain. Rule out biliary obstruction.
TECHNIQUE: Multiplanar-multisequence MR imaging of the abdomen was performed without contrast. Thick slab MRCP images were obtained per protocol. 3-D postprocessing MIP imaging performed per MRCP protocol.

[Series 1001: survey · axial · 8.0mm · 0.84mm/px · z∈[+10,+217]mm · 2 of 23 slices shown]
[im 1/23]
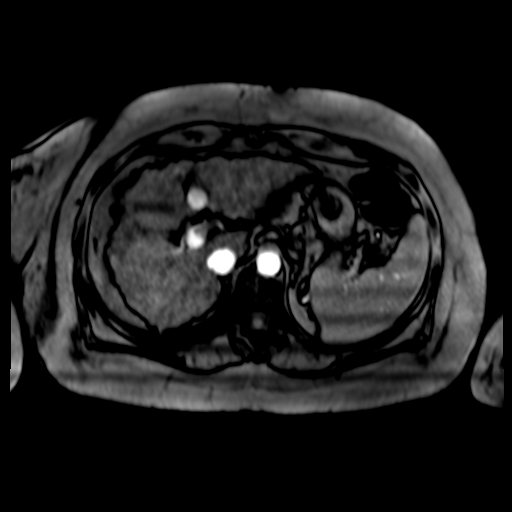
[im 23/23]
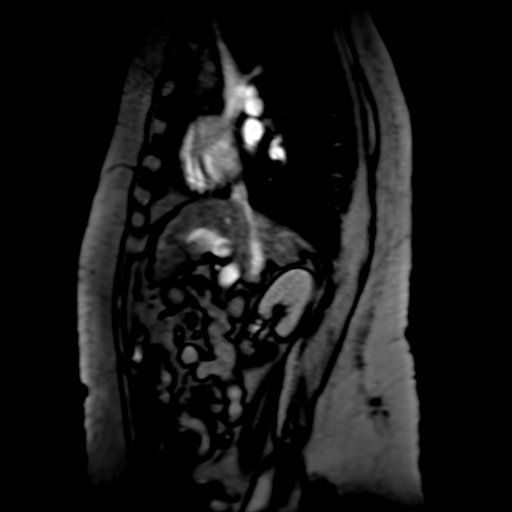

[Series 3001: T2 · coronal · 6.0mm · 1.56mm/px · 5 of 34 slices shown (1 of 2)]
[im 1/34]
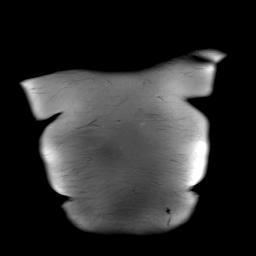
[im 9/34]
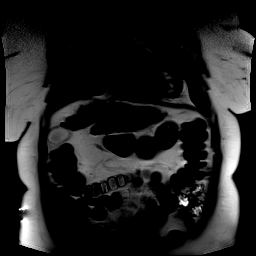
[im 17/34]
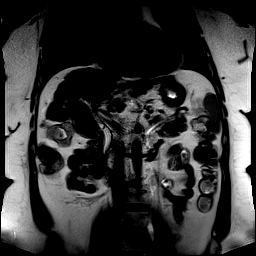
[im 25/34]
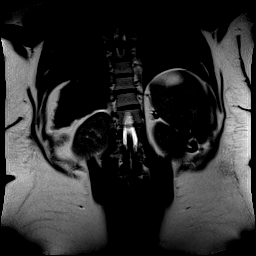
[im 34/34]
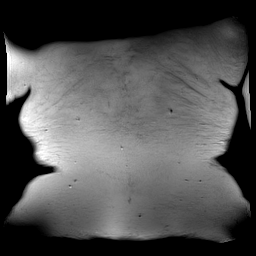

[Series 4001: ax btfe · axial · 6.0mm · 1.19mm/px · z∈[-94,+110]mm · 5 of 35 slices shown]
[im 1/35]
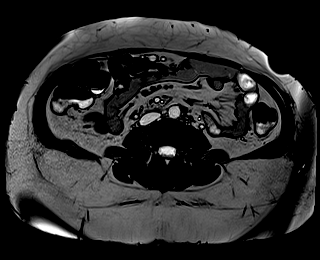
[im 9/35]
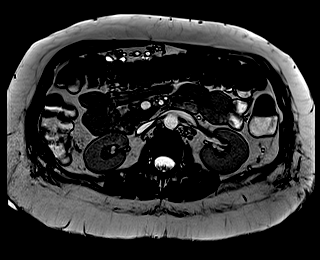
[im 18/35]
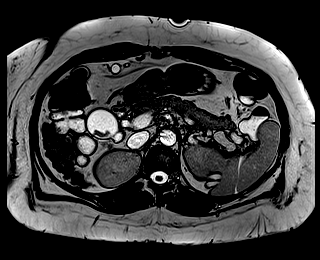
[im 26/35]
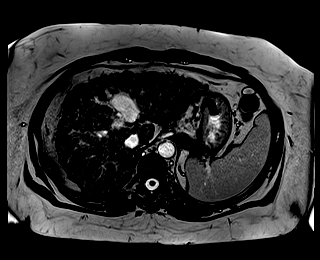
[im 35/35]
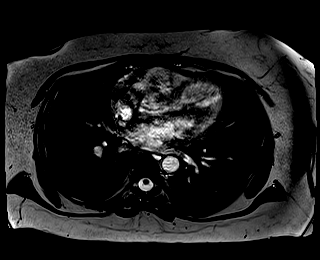

[Series 5001: T1 dynamic · axial · 3.0mm · 1.19mm/px · z∈[-111,+126]mm · 22 of 160 slices shown]
[im 1/160]
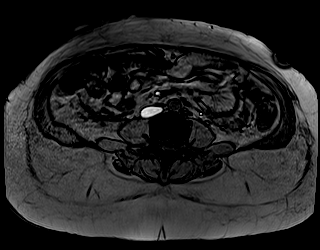
[im 8/160]
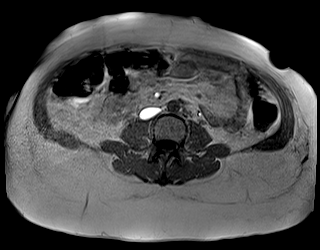
[im 16/160]
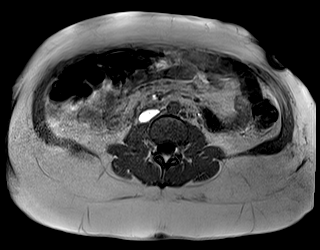
[im 23/160]
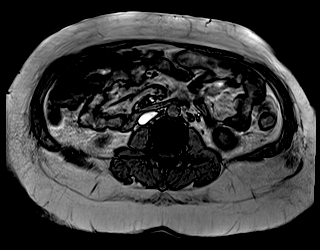
[im 31/160]
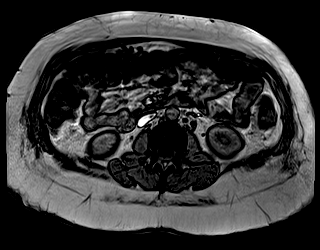
[im 38/160]
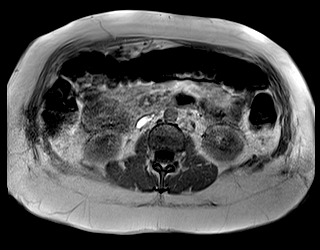
[im 46/160]
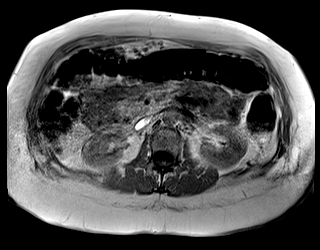
[im 54/160]
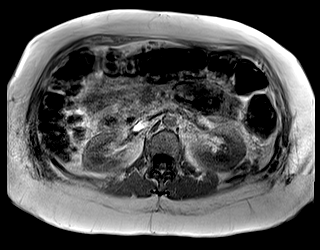
[im 61/160]
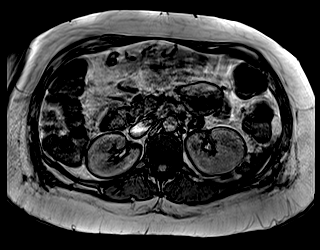
[im 69/160]
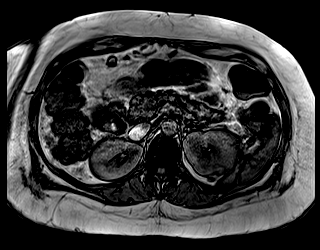
[im 76/160]
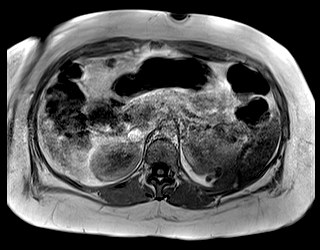
[im 84/160]
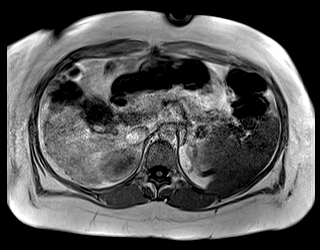
[im 91/160]
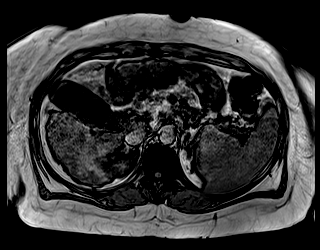
[im 99/160]
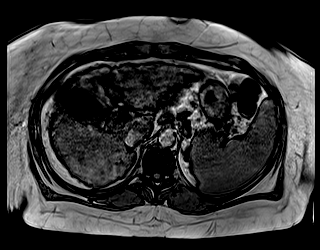
[im 107/160]
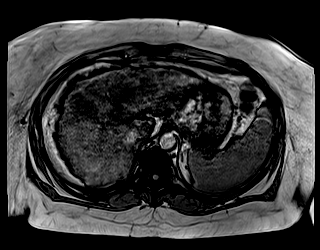
[im 114/160]
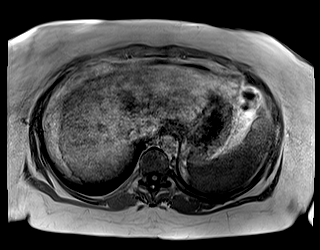
[im 122/160]
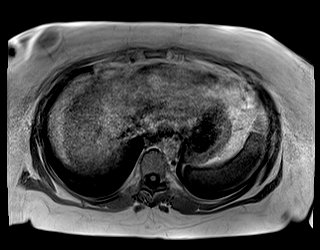
[im 129/160]
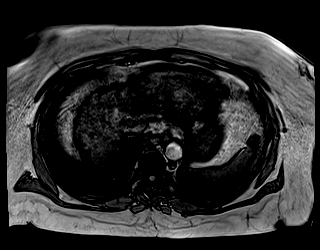
[im 137/160]
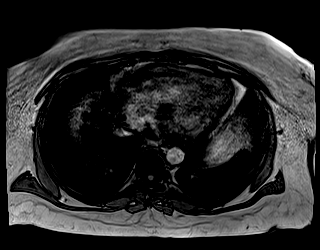
[im 144/160]
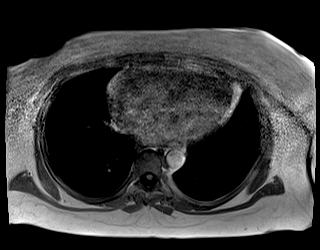
[im 152/160]
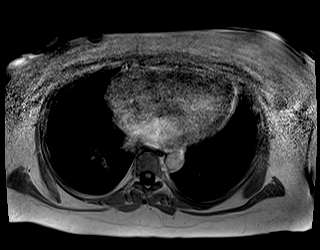
[im 160/160]
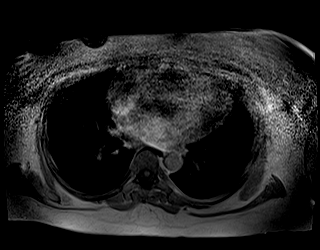

[Series 6001: T2 · axial · 6.0mm · 1.48mm/px · z∈[-94,+110]mm · 5 of 35 slices shown (2 of 2)]
[im 1/35]
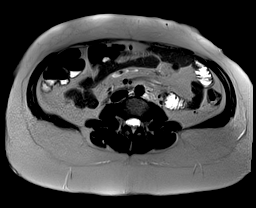
[im 9/35]
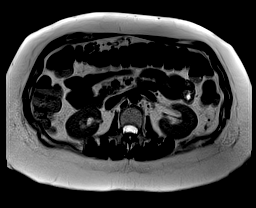
[im 18/35]
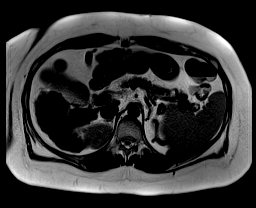
[im 26/35]
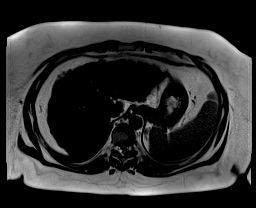
[im 35/35]
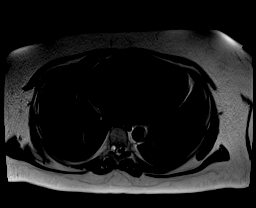

[39 of 48 positions shown; findings below may reference images not displayed]

FINDINGS: Lung bases are clear.
Liver has advanced cirrhotic morphology with severe nodularity.
Numerous gallstones are seen within the gallbladder lumen. However no pericholecystic edema. The common duct measures up to 9 mm. However no definitive intraductal filling defect. The filling defect seen in question by prior MRI not reproduced suggesting that it was due to motion artifact on the prior study. Pancreatic duct is within normal limits. The common duct tapers distally near the ampulla.
Spleen, pancreas, and adrenals unremarkable. Both kidneys are symmetric without hydronephrosis or hydroureter. No abnormal mesenteric or retroperitoneal lymphadenopathy. Imaged bowel loops are within normal limits. No ascites appreciated. No abnormal lymphadenopathy. Bones are within normal limits.
IMPRESSION: 1. Cholelithiasis but no pericholecystic edema.
2. Mild dilatation of the common duct but no definitive intraductal filling defect. The filling defect seen by prior MRI is not reproduced on today's exam suggesting that it was due to motion artifact on the prior study.
3. Cirrhotic morphology of liver.
Is the patient pregnant?
Unknown

## 2022-10-17 IMAGING — DX XR CHEST 1 VIEW
1 series · 1 of 1 positions shown · non-contrast
Comparison: 07/09/2022

FINAL REPORT:
EXAM: XR CHEST 1 VIEW
INDICATION: dizziness

[AP]
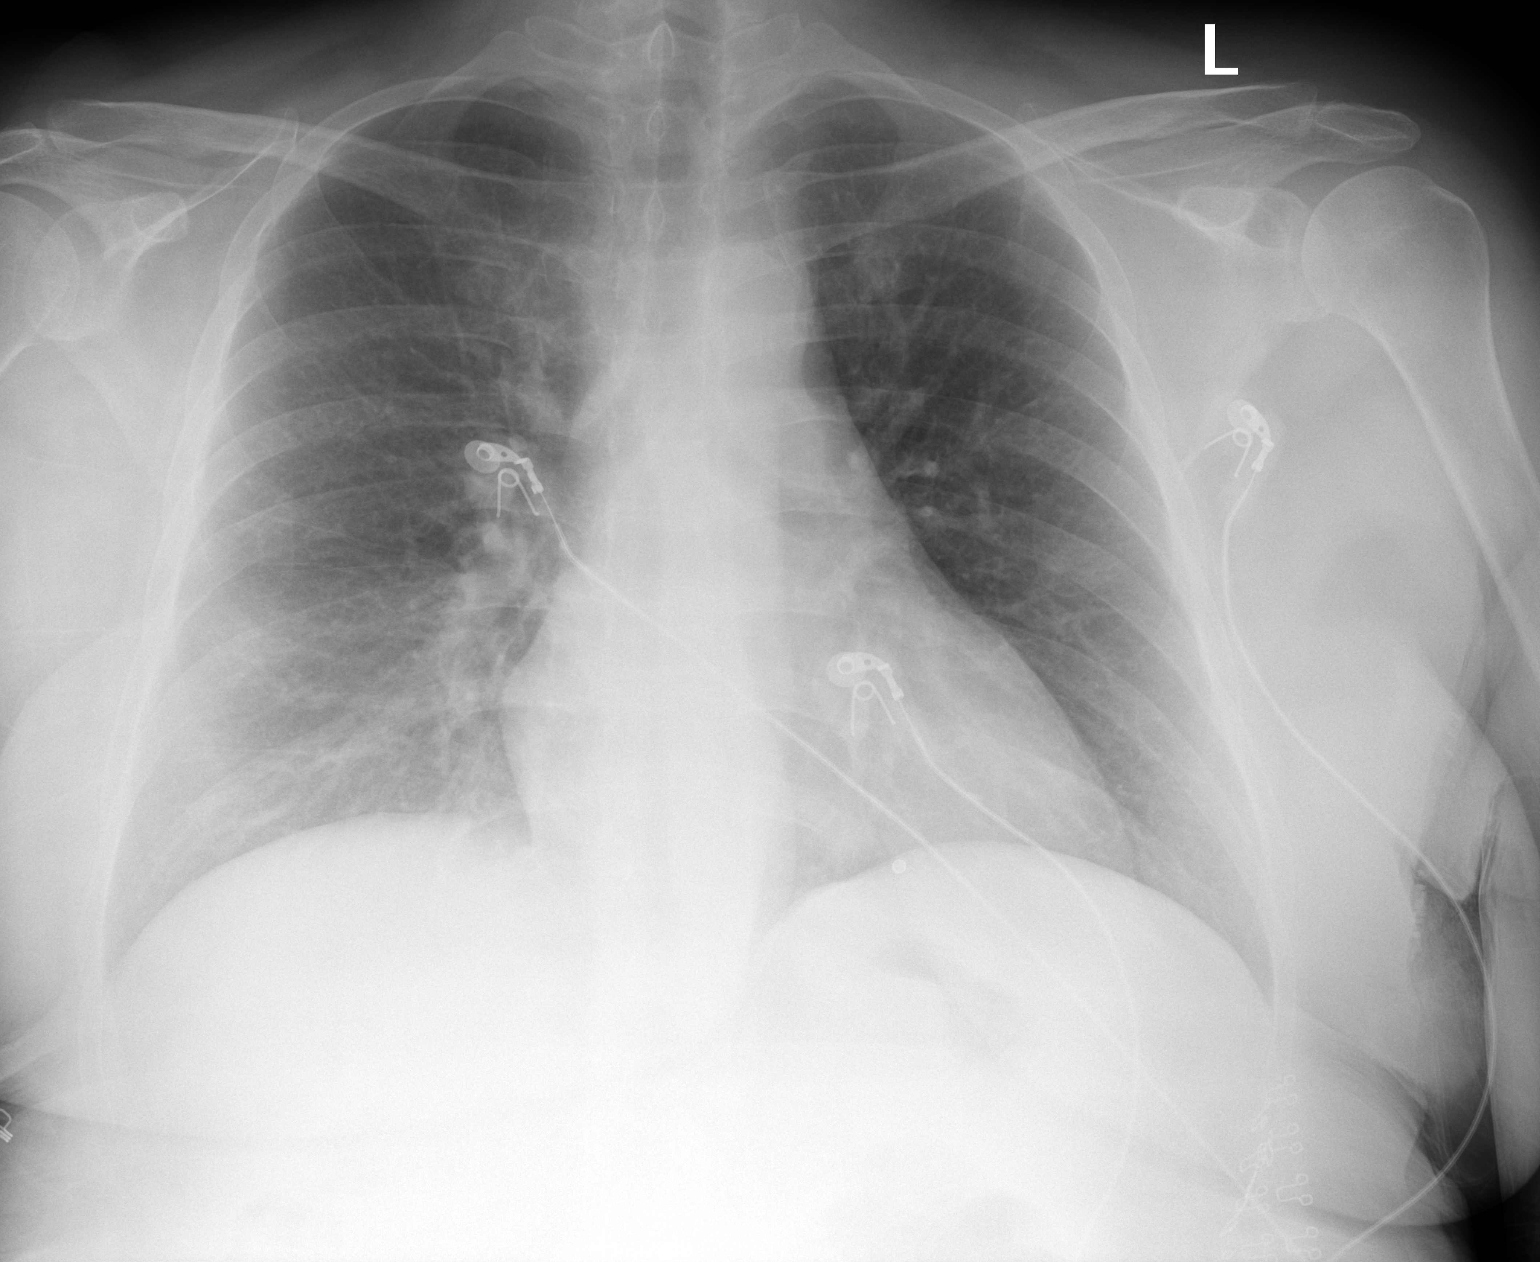

[1 of 1 positions shown; findings below may reference images not displayed]

FINDINGS: Heart size normal. Lungs clear. No pleural effusion or pneumothorax
IMPRESSION: *  No acute findings.
Is the patient pregnant?
Unknown

## 2022-10-18 IMAGING — US US ABDOMEN RUQ
1 series · 13 of 25 positions shown · non-contrast
Comparison: MRCP October 13, 2022

FINAL REPORT:
EXAM: Limited abdominal ultrasound.
HISTORY: Biliary colic, recurrent, gallbladder dyskinesia suspected
TECHNIQUE: Grayscale, color Doppler, and spectral Doppler sonography was performed of the abdomen.

[Series 1: us abdomen ruq · 13 of 54 slices shown]
[im 1/54]
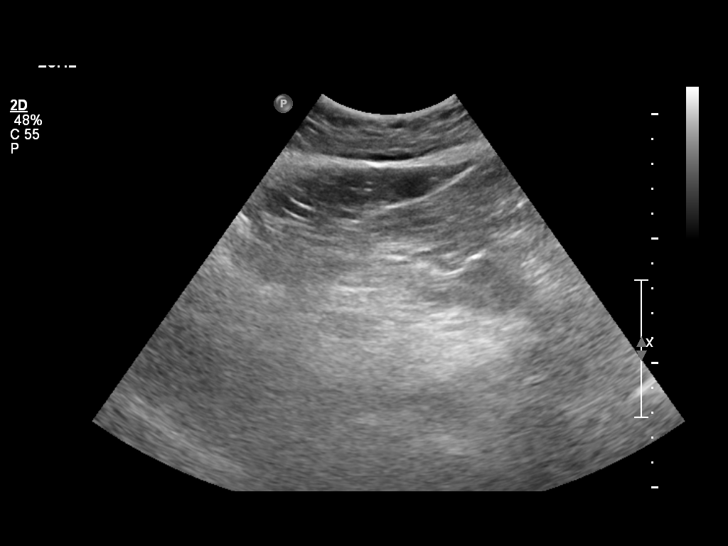
[im 5/54]
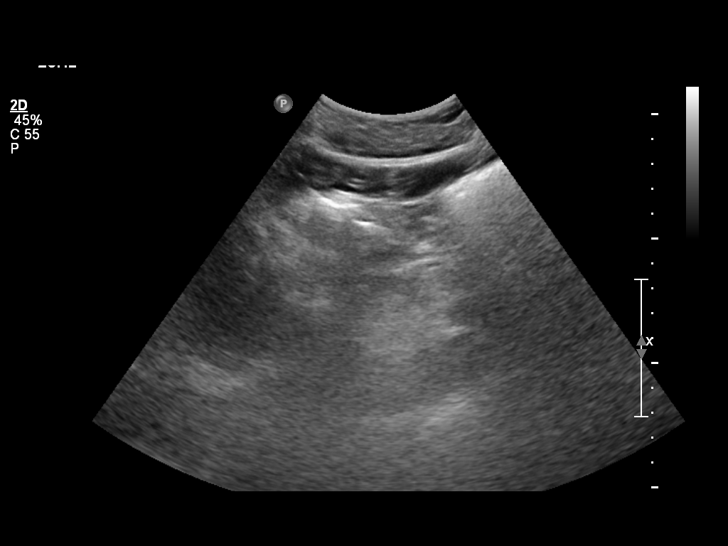
[im 9/54]
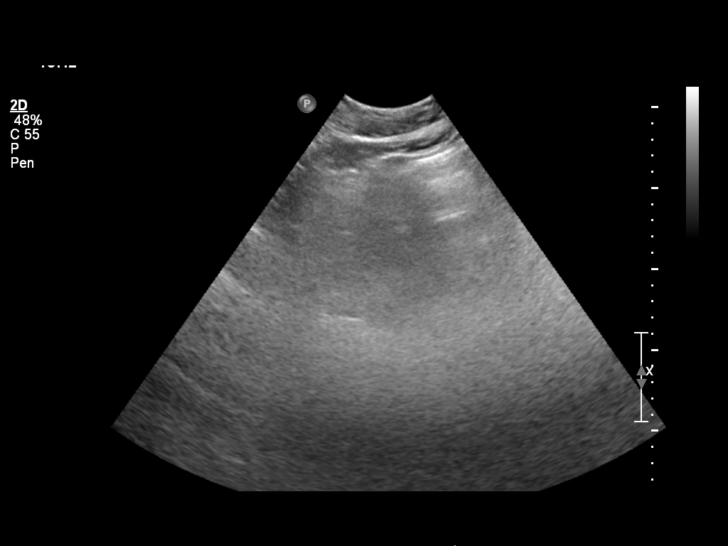
[im 14/54]
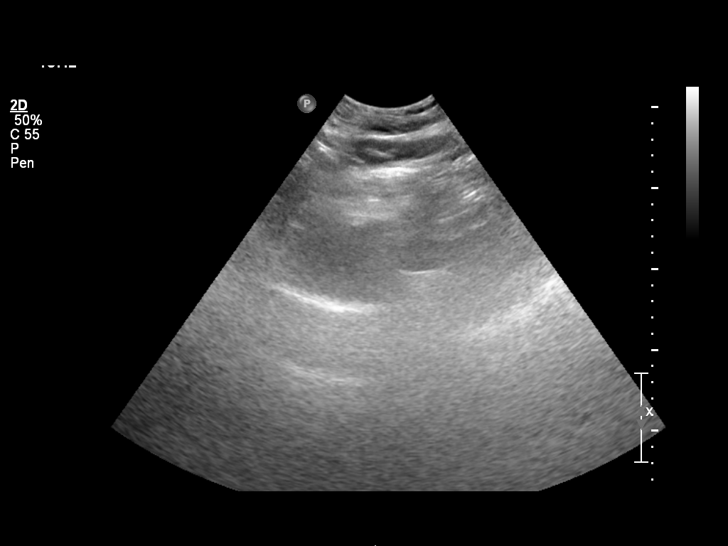
[im 18/54]
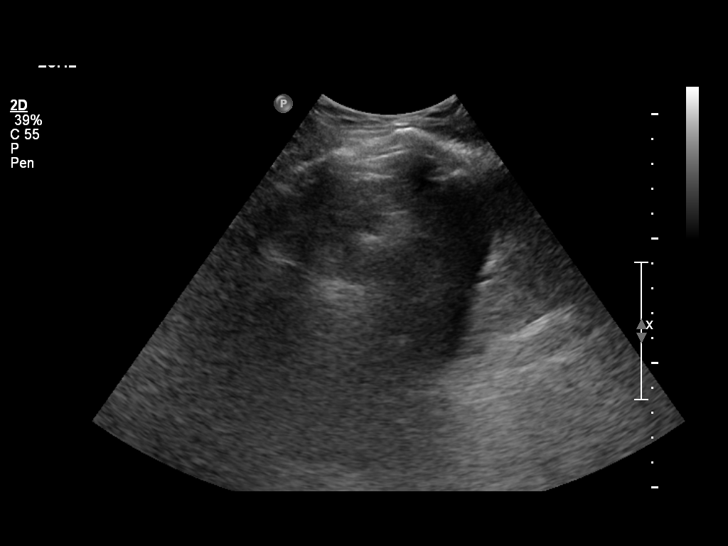
[im 23/54]
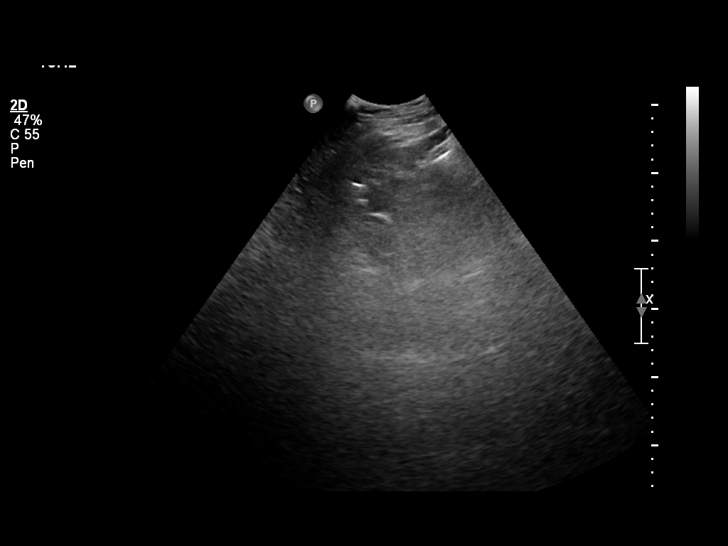
[im 27/54]
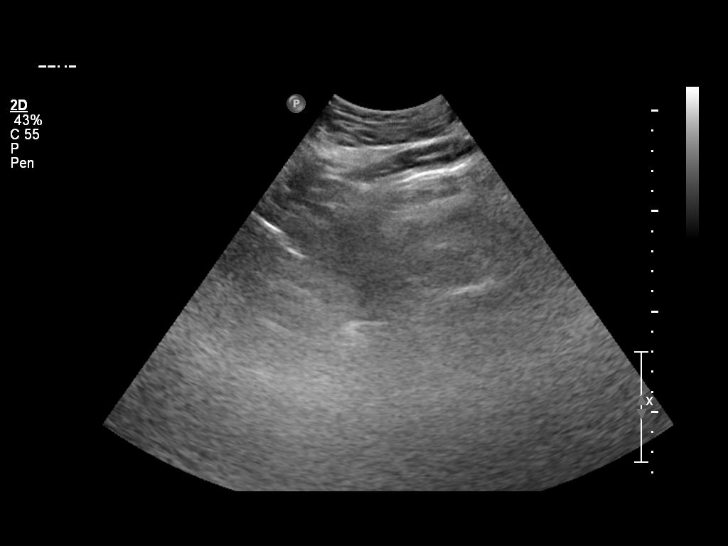
[im 31/54]
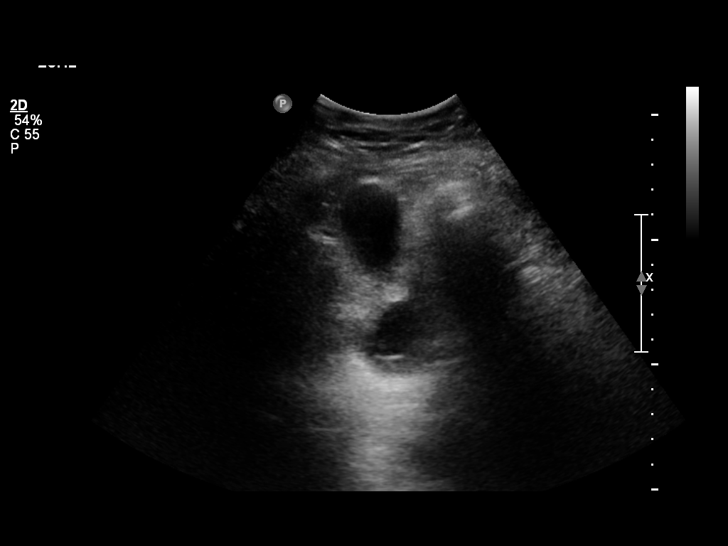
[im 36/54]
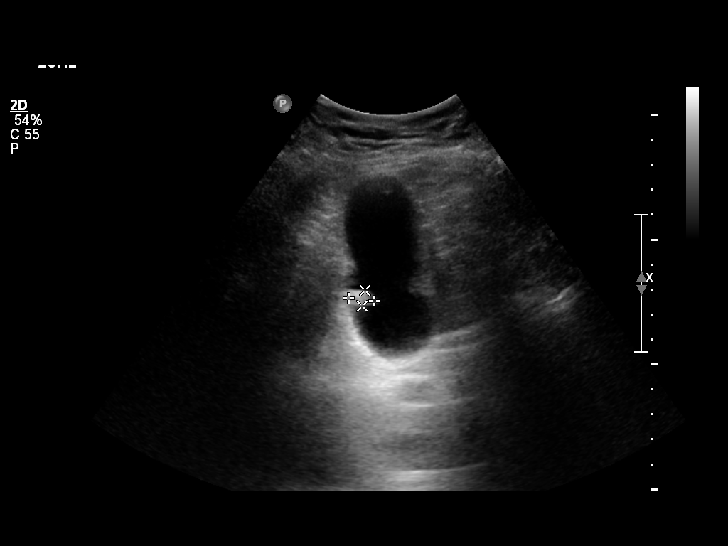
[im 40/54]
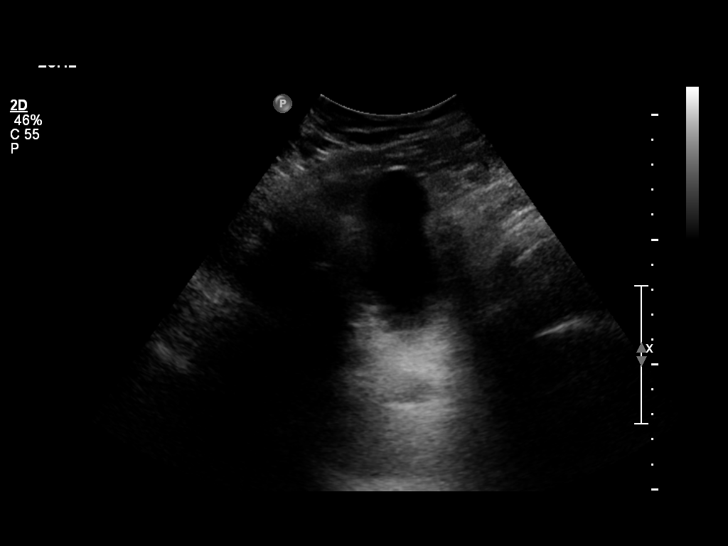
[im 45/54]
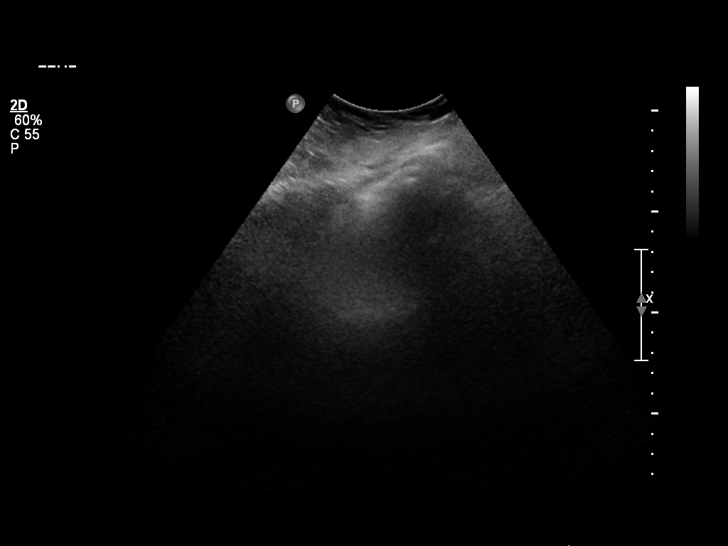
[im 49/54]
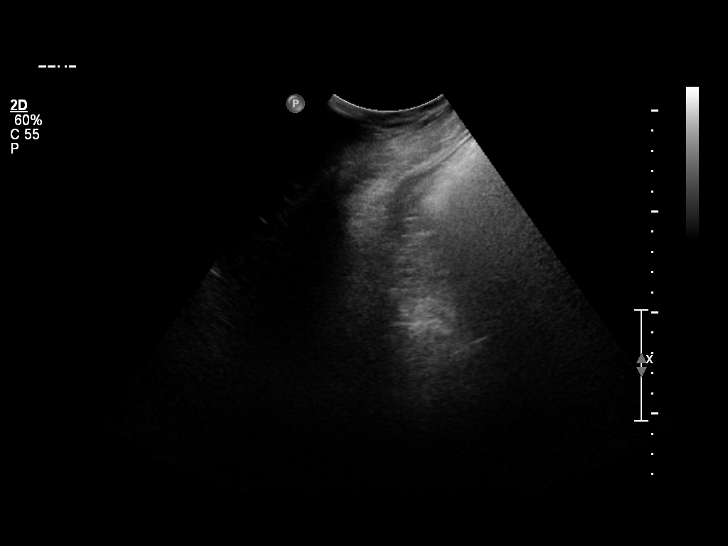
[im 54/54]
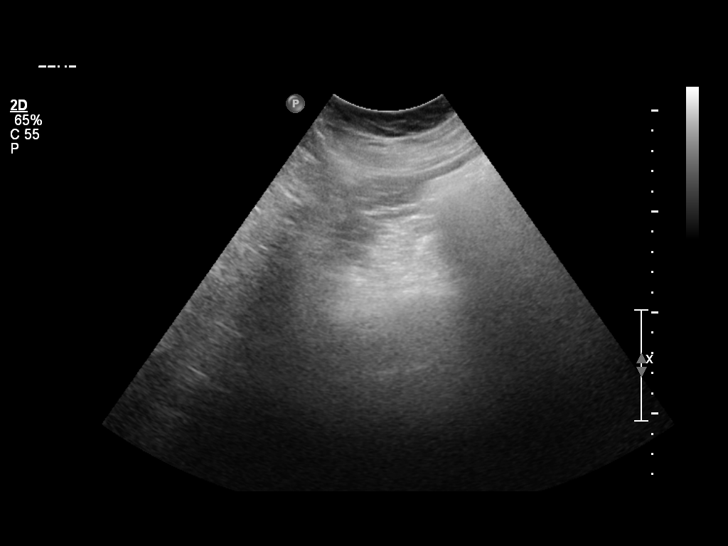

[13 of 25 positions shown; findings below may reference images not displayed]

FINDINGS: The pancreas is nonvisualized due to overlying bowel gas.
The liver demonstrates normal  echogenicity. The liver is poorly visualized due to overlying bowel gas. . The portal vein is patent and demonstrates hepatopedal flow. There is no evidence for intrahepatic or extrahepatic biliary ductal dilatation.
The common bile duct is not visualized due to overlying bowel gas.
There is an echogenic nondependent focus abutting the wall of the gallbladder measuring 1 x 0.6 cm.. No gallbladder wall thickening.
The right kidney is not visualized due to overlying bowel gas.
The IVC and abdominal aorta are not visualized due to overlying bowel gas.
IMPRESSION: Technically limited study. Specifically, no common bile duct could not be visualized due to overlying bowel gas.
Echogenic nondependent focus abutting the wall of the gallbladder measuring up to 1 cm, which could represent gallstones given the presence of gallstones on MRCP October 13, 2022. A gallbladder polyp cannot be ruled out. Follow-up right upper quadrant ultrasound in 3 months recommended. No gallbladder wall thickening.

## 2022-10-19 IMAGING — CT CT HEAD WITHOUT CONTRAST
2 of 3 series · 15 of 40 positions shown, 18 images · non-contrast
Comparison: 07/06/2022

[facility]
FINAL REPORT:
CT head without contrast
INDICATION: Mental status change, unknown cause
TECHNIQUE: Noncontrast axial CT imaging obtained from skull base to vertex. All CT scans at this facility use iterative reconstruction technique, dose modulation and/or weight based dosing when appropriate to reduce radiation dose to as low as reasonably achievable.

[Series 2: head stnd · axial · 0.49mm/px · z∈[-19,+115]mm · 12 of 32 slices shown, 15 images]
[im 3/32  brain]
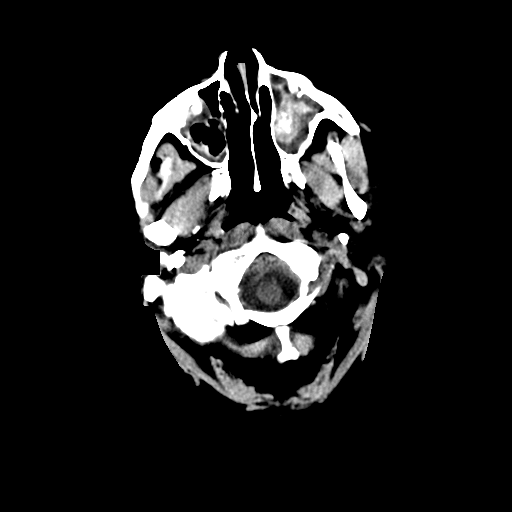
[im 3/32  bone]
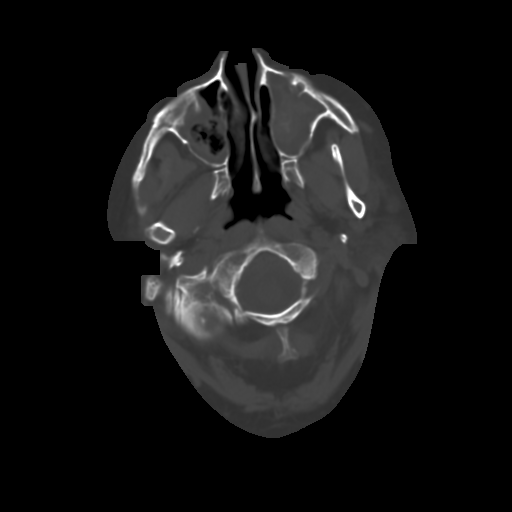
[im 5/32  brain]
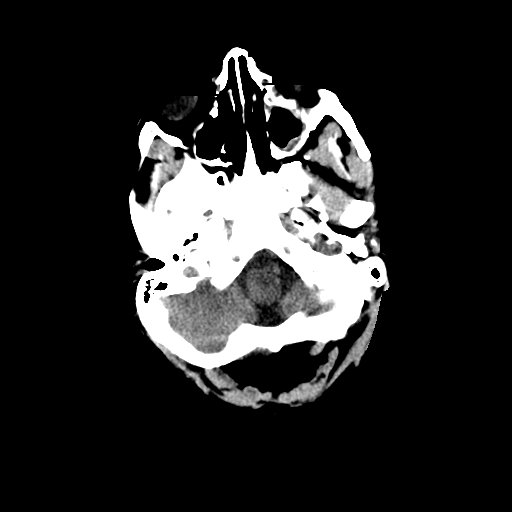
[im 7/32  brain]
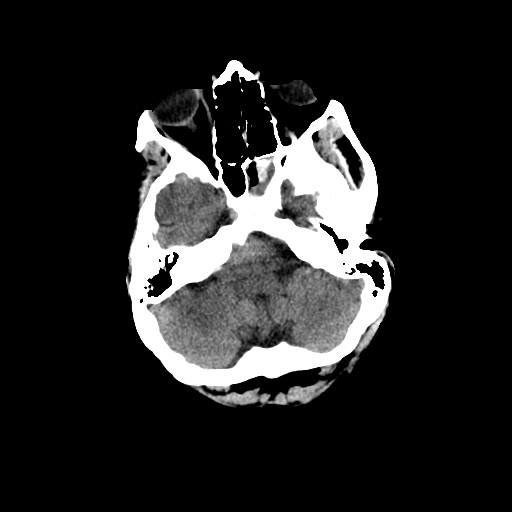
[im 10/32  brain]
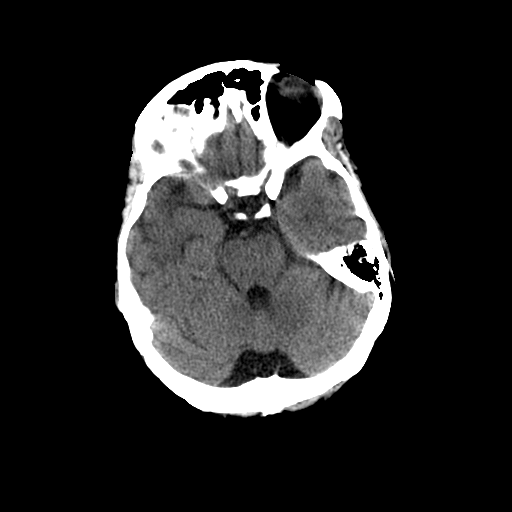
[im 12/32  brain]
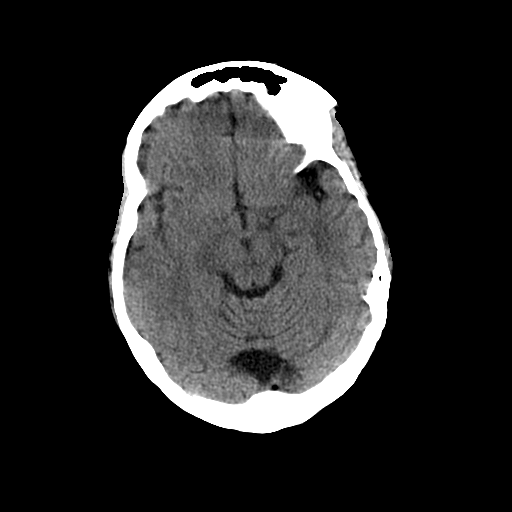
[im 12/32  bone]
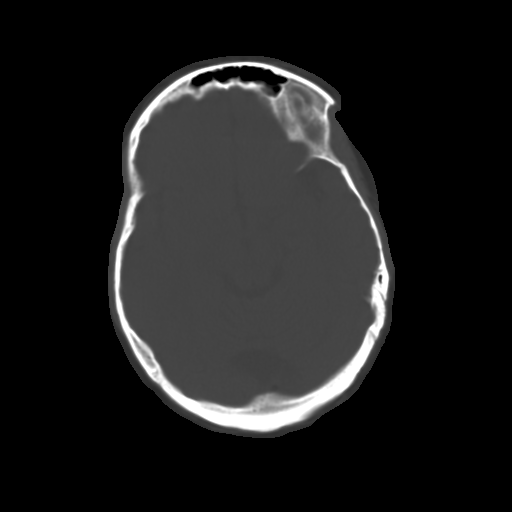
[im 14/32  brain]
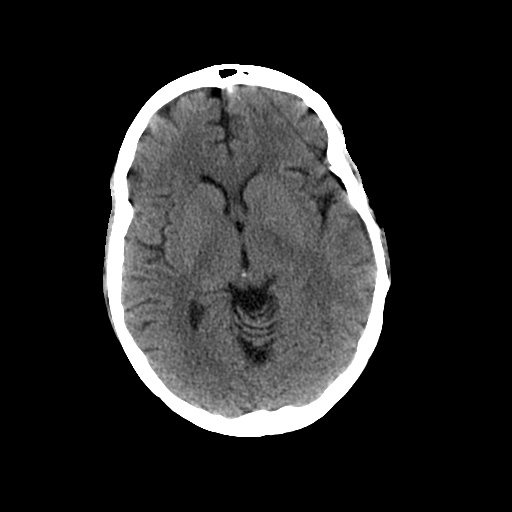
[im 18/32  brain]
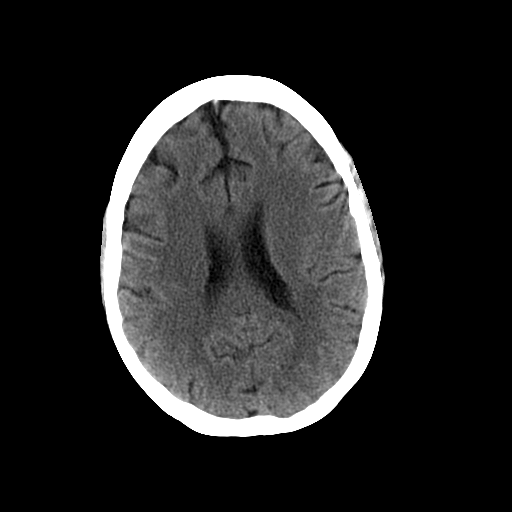
[im 20/32  brain]
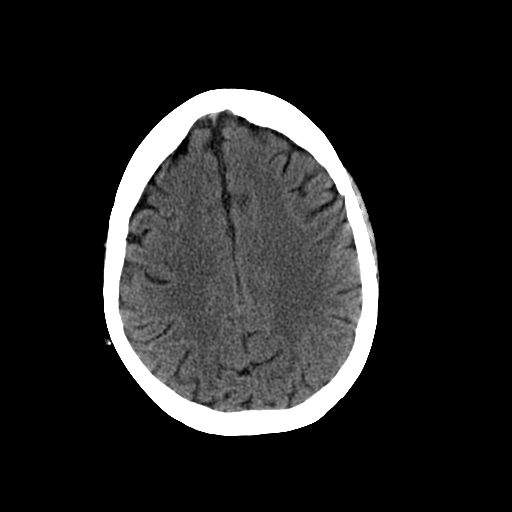
[im 22/32  brain]
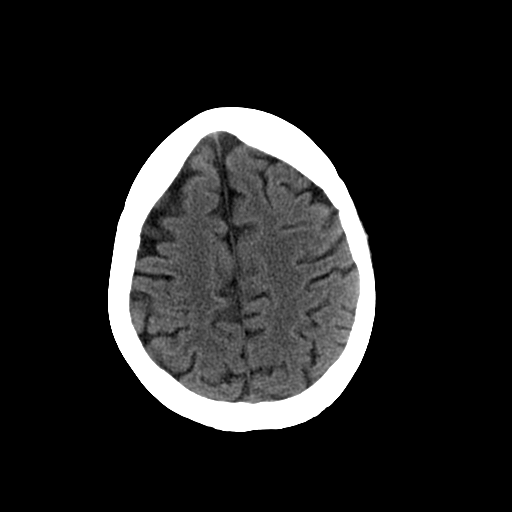
[im 22/32  bone]
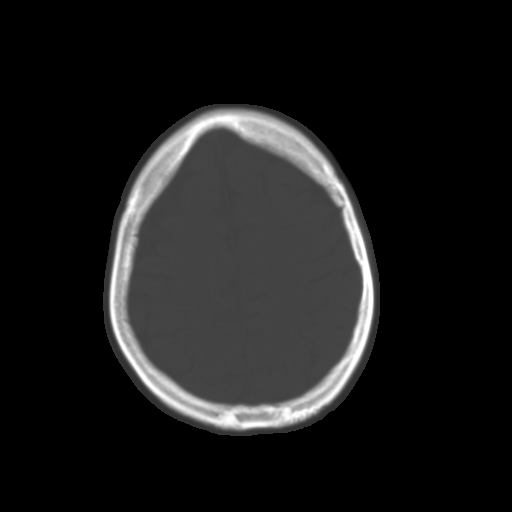
[im 25/32  brain]
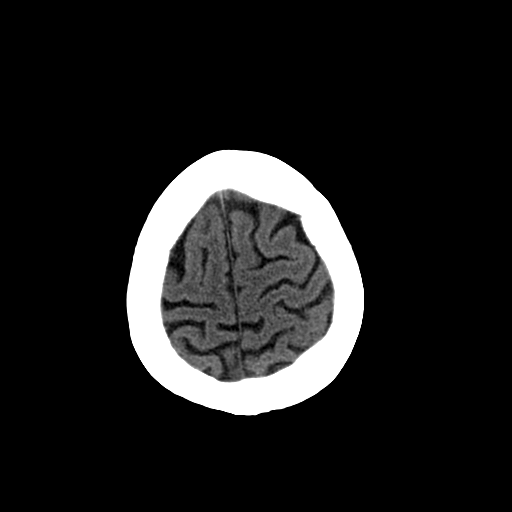
[im 27/32  brain]
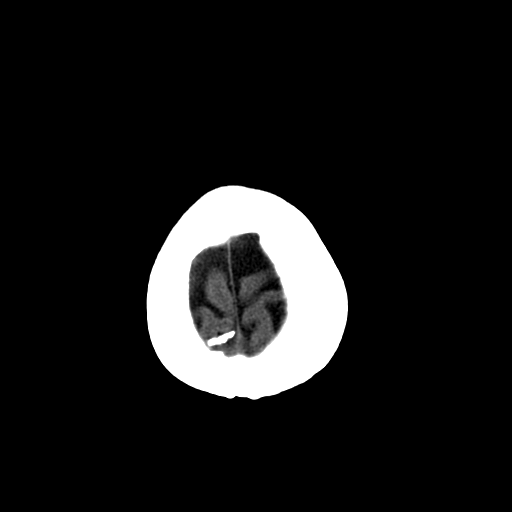
[im 29/32  brain]
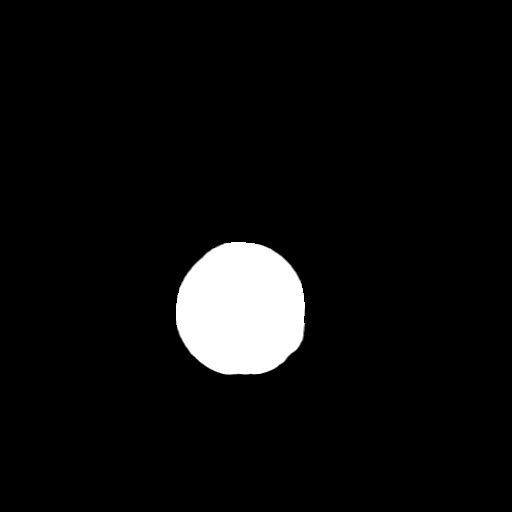

[Series 601: cor head · coronal · 0.49mm/px · 3 of 122 slices shown]
[im 25/122  brain]
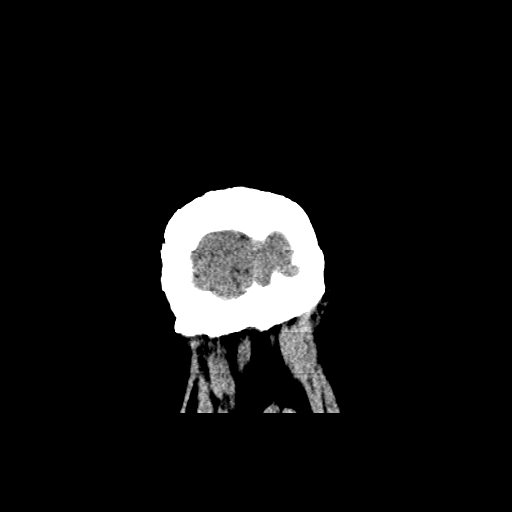
[im 49/122  brain]
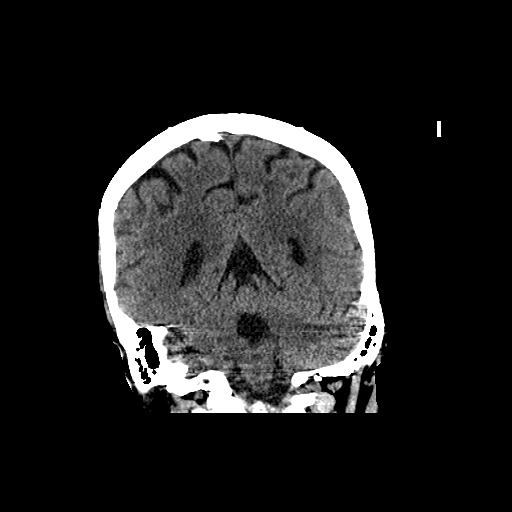
[im 73/122  brain]
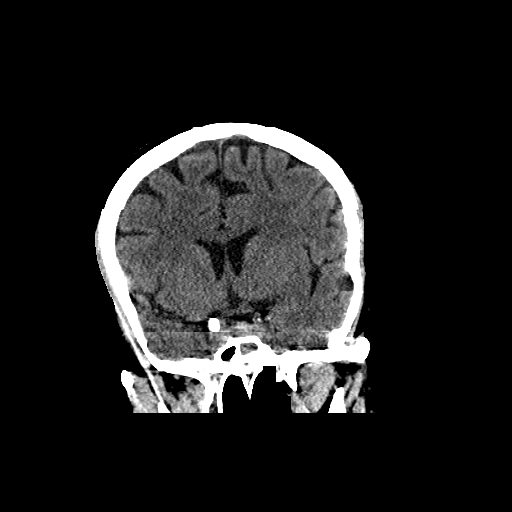

[15 of 40 positions shown; findings below may reference images not displayed]

FINDINGS: Brain parenchyma: No acute infarct, hemorrhage, mass or mass effect.
Cervical cord: Visualized portions are normal.
CSF spaces: Ventricles and basal cisterns are normal in size and configuration. No extra-axial fluid collection or mass.
Soft tissues: Normal.
Bones: Calvarium and skull base appear normal.
Sinuses/mastoids: Subtotal opacification of the left sphenoid sinus. Moderate bilateral maxillary sinus mucosal thickening.. Mastoids are clear.
Orbits: Normal.
Vessels: No hyperdense vessel sign.
IMPRESSION: No acute intracranial abnormality.
Is the patient pregnant?
Unknown
